# Patient Record
Sex: Male | Born: 1955
Health system: Southern US, Community
[De-identification: ages and names within clinical notes are randomized; demographics above are authoritative.]

## PROBLEM LIST (undated history)

## (undated) DIAGNOSIS — C801 Malignant (primary) neoplasm, unspecified: Secondary | ICD-10-CM

## (undated) DIAGNOSIS — C61 Malignant neoplasm of prostate: Secondary | ICD-10-CM

## (undated) DIAGNOSIS — Z9889 Other specified postprocedural states: Secondary | ICD-10-CM

## (undated) DIAGNOSIS — K219 Gastro-esophageal reflux disease without esophagitis: Secondary | ICD-10-CM

## (undated) DIAGNOSIS — E785 Hyperlipidemia, unspecified: Secondary | ICD-10-CM

## (undated) DIAGNOSIS — R112 Nausea with vomiting, unspecified: Secondary | ICD-10-CM

## (undated) DIAGNOSIS — J302 Other seasonal allergic rhinitis: Secondary | ICD-10-CM

## (undated) DIAGNOSIS — T8859XA Other complications of anesthesia, initial encounter: Secondary | ICD-10-CM

## (undated) DIAGNOSIS — M2550 Pain in unspecified joint: Secondary | ICD-10-CM

## (undated) DIAGNOSIS — IMO0001 Reserved for inherently not codable concepts without codable children: Secondary | ICD-10-CM

## (undated) DIAGNOSIS — D649 Anemia, unspecified: Secondary | ICD-10-CM

## (undated) DIAGNOSIS — M2629 Other anomalies of dental arch relationship: Secondary | ICD-10-CM

## (undated) DIAGNOSIS — K649 Unspecified hemorrhoids: Secondary | ICD-10-CM

## (undated) HISTORY — DX: Reserved for inherently not codable concepts without codable children: IMO0001

## (undated) HISTORY — DX: Pain in unspecified joint: M25.50

## (undated) HISTORY — PX: CARPAL TUNNEL RELEASE: SHX101

## (undated) HISTORY — DX: Gastro-esophageal reflux disease without esophagitis: K21.9

## (undated) HISTORY — DX: Unspecified hemorrhoids: K64.9

## (undated) HISTORY — DX: Hyperlipidemia, unspecified: E78.5

## (undated) HISTORY — DX: Malignant neoplasm of prostate: C61

---

## 2002-01-06 HISTORY — PX: VASECTOMY: SHX75

## 2005-10-12 DIAGNOSIS — E7800 Pure hypercholesterolemia, unspecified: Secondary | ICD-10-CM | POA: Insufficient documentation

## 2005-10-12 DIAGNOSIS — G4700 Insomnia, unspecified: Secondary | ICD-10-CM | POA: Insufficient documentation

## 2005-11-14 ENCOUNTER — Emergency Department (HOSPITAL_COMMUNITY): Admission: EM | Admit: 2005-11-14 | Discharge: 2005-11-14 | Payer: Self-pay | Admitting: Emergency Medicine

## 2006-05-26 DIAGNOSIS — K219 Gastro-esophageal reflux disease without esophagitis: Secondary | ICD-10-CM | POA: Insufficient documentation

## 2006-06-14 ENCOUNTER — Ambulatory Visit: Payer: Self-pay | Admitting: Gastroenterology

## 2007-01-07 ENCOUNTER — Encounter: Admission: RE | Admit: 2007-01-07 | Discharge: 2007-01-07 | Payer: Self-pay | Admitting: Neurology

## 2007-02-01 ENCOUNTER — Ambulatory Visit: Payer: Self-pay | Admitting: Neurology

## 2007-07-20 DIAGNOSIS — Z8249 Family history of ischemic heart disease and other diseases of the circulatory system: Secondary | ICD-10-CM | POA: Insufficient documentation

## 2010-05-22 ENCOUNTER — Ambulatory Visit: Payer: Self-pay | Admitting: Family Medicine

## 2010-10-10 ENCOUNTER — Ambulatory Visit: Payer: Self-pay | Admitting: Family Medicine

## 2011-11-10 LAB — HM COLONOSCOPY

## 2012-03-09 HISTORY — PX: ANAL FISSURE REPAIR: SHX2312

## 2012-06-21 ENCOUNTER — Encounter: Payer: Self-pay | Admitting: General Surgery

## 2012-06-21 ENCOUNTER — Ambulatory Visit (INDEPENDENT_AMBULATORY_CARE_PROVIDER_SITE_OTHER): Payer: BC Managed Care – PPO | Admitting: General Surgery

## 2012-06-21 VITALS — BP 110/62 | HR 80 | Resp 14 | Ht 70.0 in | Wt 165.0 lb

## 2012-06-21 DIAGNOSIS — K6289 Other specified diseases of anus and rectum: Secondary | ICD-10-CM

## 2012-06-21 DIAGNOSIS — B372 Candidiasis of skin and nail: Secondary | ICD-10-CM

## 2012-06-21 DIAGNOSIS — K645 Perianal venous thrombosis: Secondary | ICD-10-CM | POA: Insufficient documentation

## 2012-06-21 MED ORDER — NYSTATIN 100000 UNIT/GM EX POWD: Freq: Two times a day (BID) | CUTANEOUS | Status: DC

## 2012-06-21 NOTE — Patient Instructions (Signed)
Patient instructed to discontinue the use of proctofoam. Patient to return in 1 month. Prescribed rectal powder to use twice daily.

## 2012-06-21 NOTE — Progress Notes (Signed)
Patient ID: Dale Gray, male   DOB: [birth year], 57 y.o.   MRN: [id number]  Chief Complaint  Patient presents with  . Rectal Pain    evaluation for possible anal fissure    HPI Dale Gray is a 57 y.o. male who presents for rectal pain with possible anal fissure. The patient states this has been a problem for approximately 1 year. He states the pain is constantly there. Some days it's worse than others. When he rides equipment that tends to be bothersome. He states that he feels raw all the time. Approximately a year ago he prescribed proctofoam to use which seems to help. He states he is tired of dealing with the pain. He initially was seen by Dale Gray which referred him to Dale Gray. Dale Gray told him that he had an anal fissure and was given more medication at that time.  The patient reports that the sharp razor blade-like pain present on initial assessment with the GI department has resolved. Most of his discomfort at this time is related to burning and itching. HPI  Past Medical History  Diagnosis Date  . Hyperlipidemia   . Reflux   . Hemorrhoid   . Joint pain     History reviewed. No pertinent past surgical history.  History reviewed. No pertinent family history.  Social History History  Substance Use Topics  . Smoking status: Never Smoker   . Smokeless tobacco: Never Used  . Alcohol Use: No    No Known Allergies  Current Outpatient Prescriptions  Medication Sig Dispense Refill  . aspirin 81 MG tablet Take 81 mg by mouth daily.      . fish oil-omega-3 fatty acids 1000 MG capsule Take 2 g by mouth daily.      . hydrocortisone-pramoxine (PROCTOFOAM-HC) rectal foam Place 1 applicator rectally 2 (two) times daily.      Marland Kitchen nystatin (MYCOSTATIN) powder Apply topically 2 (two) times daily. Apply small amount to anal area twice a day.  30 g  1  . omeprazole (PRILOSEC) 40 MG capsule Take 40 mg by mouth daily.      . pregabalin (LYRICA) 100 MG capsule Take 100 mg by  mouth 2 (two) times daily.      . Red Yeast Rice 600 MG CAPS Take 1,200 mg by mouth daily.      Marland Kitchen zolpidem (AMBIEN CR) 12.5 MG CR tablet Take 12.5 mg by mouth at bedtime as needed for sleep.       No current facility-administered medications for this visit.    Review of Systems Review of Systems  Constitutional: Negative.   Respiratory: Negative.   Cardiovascular: Negative.   Gastrointestinal: Positive for rectal pain.    Blood pressure 110/62, pulse 80, resp. rate 14, height 5\' 10"  (1.778 m), weight 165 lb (74.844 kg).  Physical Exam Physical Exam  Constitutional: He appears well-developed and well-nourished.  Neck: Trachea normal. No mass and no thyromegaly present.  Cardiovascular: Normal rate, regular rhythm, normal heart sounds and normal pulses.   No murmur heard. Pulmonary/Chest: Effort normal and breath sounds normal.  Abdominal: Soft. Normal appearance and bowel sounds are normal. There is no tenderness.  Genitourinary: Prostate normal.  External rash. Yeast infection.    Data Reviewed Records from Dale Shark, NP at [facility] GI department were reviewed. At the time of his 11/03/2011 examination he reported a six-month history of anal pain. Diagnosis at that time was anal fissure. He was treated with Analpram-HC cream 2.5%. Subsequently  he underwent a colonoscopy completed by Dale Gray, M.D. This was completed on 11/10/2011. Examination worse notable for an anal fissure but otherwise no abnormality.  Assessment    Yeast overgrowth secondary to chronic steroid use.    Plan    The patient has been placed on nystatin powder to be used twice a day. He is not to make use of any more topical steroids. Will see his response over the next month.       Dale Gray 06/22/2012, 4:12 PM

## 2012-06-22 ENCOUNTER — Encounter: Payer: Self-pay | Admitting: General Surgery

## 2012-06-22 DIAGNOSIS — B372 Candidiasis of skin and nail: Secondary | ICD-10-CM | POA: Insufficient documentation

## 2012-07-27 ENCOUNTER — Encounter: Payer: Self-pay | Admitting: General Surgery

## 2012-07-27 ENCOUNTER — Ambulatory Visit (INDEPENDENT_AMBULATORY_CARE_PROVIDER_SITE_OTHER): Payer: BC Managed Care – PPO | Admitting: General Surgery

## 2012-07-27 VITALS — BP 124/62 | HR 62 | Resp 14 | Ht 70.0 in | Wt 163.0 lb

## 2012-07-27 DIAGNOSIS — K6289 Other specified diseases of anus and rectum: Secondary | ICD-10-CM

## 2012-07-27 DIAGNOSIS — B372 Candidiasis of skin and nail: Secondary | ICD-10-CM

## 2012-07-27 NOTE — Patient Instructions (Signed)
Patient advised to could purchase a gel cushion to sit on while riding his tractor.

## 2012-07-27 NOTE — Progress Notes (Signed)
Patient ID: [name], male   DOB: [birth year], 57 y.o.   MRN: [id number]  Chief Complaint  Patient presents with  . Follow-up    1 month follow up rectal pain    HPI Dale Gray is a 57 y.o. male who presents for a 1 month follow up of rectal pain. The patient states he has good days and bad days. He states he is still using the nystatin powder that was prescribed at last visit. He states it has helped some. 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 He has been making use of an "doughnut". HPI  Past Medical History  Diagnosis Date  . Hyperlipidemia   . Reflux   . Hemorrhoid   . Joint pain     History reviewed. No pertinent past surgical history.  History reviewed. No pertinent family history.  Social History History  Substance Use Topics  . Smoking status: Never Smoker   . Smokeless tobacco: Never Used  . Alcohol Use: No    No Known Allergies  Current Outpatient Prescriptions  Medication Sig Dispense Refill  . aspirin 81 MG tablet Take 81 mg by mouth daily.      . fish oil-omega-3 fatty acids 1000 MG capsule Take 2 g by mouth daily.      Marland Kitchen nystatin (MYCOSTATIN) powder Apply topically 2 (two) times daily. Apply small amount to anal area twice a day.  30 g  1  . omeprazole (PRILOSEC) 40 MG capsule Take 40 mg by mouth daily.      . pregabalin (LYRICA) 100 MG capsule Take 100 mg by mouth 2 (two) times daily.      . Red Yeast Rice 600 MG CAPS Take 1,200 mg by mouth daily.      Marland Kitchen zolpidem (AMBIEN CR) 12.5 MG CR tablet Take 12.5 mg by mouth at bedtime as needed for sleep.       No current facility-administered medications for this visit.    Review of Systems Review of Systems  Constitutional: Negative.   Respiratory: Negative.   Cardiovascular: Negative.   Gastrointestinal: Positive for rectal pain.    Blood pressure 124/62, pulse 62, resp. rate 14, height 5\' 10"  (1.778 m), weight 163 lb (73.936 kg).  Physical  Exam Physical Exam Examination of the external anal area showed a marked improvement in the erythema and thickening of the skin. With figure straining no prolapse is noted. No internal/external hemorrhoids evident. Data Reviewed None  Assessment    Benign perianal exam.    Plan    The patient was encouraged to get a gel pad for his tractor to see if this will improve his comfort. He'll be using the nystatin powder when necessary.       [name] 07/27/2012, 10:35 AM

## 2012-11-29 LAB — HM HIV SCREENING LAB: HM HIV SCREENING: NEGATIVE

## 2012-11-29 LAB — HM HEPATITIS C SCREENING LAB: HM HEPATITIS C SCREENING: NEGATIVE

## 2014-01-13 ENCOUNTER — Emergency Department: Payer: Self-pay | Admitting: Emergency Medicine

## 2014-04-30 HISTORY — PX: TYMPANOPLASTY: SHX33

## 2014-05-02 ENCOUNTER — Ambulatory Visit: Payer: Self-pay | Admitting: Otolaryngology

## 2014-08-27 ENCOUNTER — Encounter: Payer: Self-pay | Admitting: Family Medicine

## 2014-08-27 ENCOUNTER — Ambulatory Visit (INDEPENDENT_AMBULATORY_CARE_PROVIDER_SITE_OTHER): Payer: BLUE CROSS/BLUE SHIELD | Admitting: Family Medicine

## 2014-08-27 VITALS — BP 112/60 | HR 68 | Temp 98.6°F | Resp 16 | Ht 70.0 in | Wt 160.0 lb

## 2014-08-27 DIAGNOSIS — J0110 Acute frontal sinusitis, unspecified: Secondary | ICD-10-CM

## 2014-08-27 MED ORDER — AMOXICILLIN 500 MG PO CAPS: 1000.0000 mg | ORAL_CAPSULE | Freq: Two times a day (BID) | ORAL | Status: AC

## 2014-08-27 MED ORDER — FLUTICASONE PROPIONATE 50 MCG/ACT NA SUSP: 2.0000 | Freq: Every day | NASAL | Status: DC

## 2014-08-27 NOTE — Progress Notes (Signed)
   Subjective:    Patient ID: Dale Gray, male    DOB: 1955-06-07, 59 y.o.   MRN: [id number]  Sinusitis This is a new problem. The current episode started in the past 7 days. The problem is unchanged. There has been no fever (Pt reports he may have had a fever last night, but he did not take his temp.). His pain is at a severity of 8/10. The pain is severe. Associated symptoms include chills, congestion, coughing, diaphoresis, headaches and sinus pressure. Pertinent negatives include no ear pain, shortness of breath, sneezing or sore throat. Past treatments include oral decongestants. The treatment provided mild relief.   No allergies before onset of above symptoms. No nose sprays or decongestants.    Review of Systems  Constitutional: Positive for fever (Possibly last night.), chills, diaphoresis and fatigue. Negative for activity change, appetite change and unexpected weight change.  HENT: Positive for congestion, postnasal drip, rhinorrhea, sinus pressure, tinnitus, trouble swallowing and voice change. Negative for dental problem, drooling, ear discharge, ear pain, facial swelling, hearing loss, mouth sores, nosebleeds, sneezing and sore throat.   Eyes: Negative for photophobia, pain, discharge, redness, itching and visual disturbance.  Respiratory: Positive for cough and wheezing. Negative for apnea, choking, chest tightness, shortness of breath and stridor.   Cardiovascular: Negative for chest pain, palpitations and leg swelling.  Gastrointestinal: Negative for nausea, vomiting, abdominal pain, diarrhea, constipation, blood in stool, abdominal distention, anal bleeding and rectal pain.  Neurological: Positive for headaches. Negative for dizziness, tremors, seizures, syncope, facial asymmetry, speech difficulty, weakness, light-headedness and numbness.       Objective:   Physical Exam   General Appearance:    Alert, cooperative, no distress  Eyes:    PERRL, conjunctiva/corneas  clear, EOM's intact       HEENT:   Moderate nasal congestion with yellow discharge. Tender frontal and maxillary sinuses.   Lungs:     Clear to auscultation bilaterally, respirations unlabored  Heart:    Regular rate and rhythm  Neurologic:   Awake, alert, oriented x 3. No apparent focal neurological           defect.             Assessment & Plan:

## 2014-09-07 ENCOUNTER — Other Ambulatory Visit: Payer: Self-pay | Admitting: Otolaryngology

## 2014-09-07 DIAGNOSIS — IMO0001 Reserved for inherently not codable concepts without codable children: Secondary | ICD-10-CM

## 2014-09-07 DIAGNOSIS — H918X2 Other specified hearing loss, left ear: Secondary | ICD-10-CM

## 2014-09-14 ENCOUNTER — Ambulatory Visit: Payer: Self-pay

## 2014-10-08 HISTORY — PX: TYMPANOPLASTY: SHX33

## 2014-10-23 ENCOUNTER — Other Ambulatory Visit: Payer: Self-pay | Admitting: Family Medicine

## 2014-10-25 ENCOUNTER — Other Ambulatory Visit: Payer: Self-pay | Admitting: Family Medicine

## 2014-11-19 ENCOUNTER — Telehealth: Payer: Self-pay | Admitting: Family Medicine

## 2014-11-19 MED ORDER — ZOLPIDEM TARTRATE ER 12.5 MG PO TBCR: 12.5000 mg | EXTENDED_RELEASE_TABLET | Freq: Every evening | ORAL | Status: DC | PRN

## 2014-11-19 NOTE — Telephone Encounter (Signed)
Rx called in to pharmacy. 

## 2014-11-19 NOTE — Telephone Encounter (Signed)
Patient needs refill on Zolpidem please.  He is totally out.    [facility].

## 2015-01-15 ENCOUNTER — Other Ambulatory Visit: Payer: Self-pay | Admitting: Family Medicine

## 2015-01-15 MED ORDER — ZOLPIDEM TARTRATE ER 12.5 MG PO TBCR: 12.5000 mg | EXTENDED_RELEASE_TABLET | Freq: Every evening | ORAL | Status: DC | PRN

## 2015-01-15 NOTE — Telephone Encounter (Signed)
RX called in at [facility]. Patient is aware.

## 2015-01-15 NOTE — Telephone Encounter (Signed)
Patient is in need of refill of Ambien.   Patient is requesting more refills on this med because he has to call in every month for refills.  [facility] in [place].

## 2015-01-22 DIAGNOSIS — M533 Sacrococcygeal disorders, not elsewhere classified: Secondary | ICD-10-CM | POA: Insufficient documentation

## 2015-01-22 DIAGNOSIS — E785 Hyperlipidemia, unspecified: Secondary | ICD-10-CM | POA: Insufficient documentation

## 2015-01-22 DIAGNOSIS — Z8719 Personal history of other diseases of the digestive system: Secondary | ICD-10-CM | POA: Insufficient documentation

## 2015-01-22 DIAGNOSIS — D509 Iron deficiency anemia, unspecified: Secondary | ICD-10-CM | POA: Insufficient documentation

## 2015-01-22 DIAGNOSIS — K602 Anal fissure, unspecified: Secondary | ICD-10-CM | POA: Insufficient documentation

## 2015-01-24 ENCOUNTER — Other Ambulatory Visit: Payer: Self-pay

## 2015-01-24 ENCOUNTER — Encounter: Payer: Self-pay | Admitting: Family Medicine

## 2015-01-24 ENCOUNTER — Ambulatory Visit (INDEPENDENT_AMBULATORY_CARE_PROVIDER_SITE_OTHER): Payer: BLUE CROSS/BLUE SHIELD | Admitting: Family Medicine

## 2015-01-24 VITALS — BP 138/82 | HR 53 | Temp 97.7°F | Resp 16 | Ht 70.75 in | Wt 174.2 lb

## 2015-01-24 DIAGNOSIS — G629 Polyneuropathy, unspecified: Secondary | ICD-10-CM | POA: Diagnosis not present

## 2015-01-24 DIAGNOSIS — E7800 Pure hypercholesterolemia, unspecified: Secondary | ICD-10-CM

## 2015-01-24 DIAGNOSIS — G4700 Insomnia, unspecified: Secondary | ICD-10-CM | POA: Diagnosis not present

## 2015-01-24 DIAGNOSIS — D509 Iron deficiency anemia, unspecified: Secondary | ICD-10-CM

## 2015-01-24 DIAGNOSIS — Z0000 Encounter for general adult medical examination without abnormal findings: Secondary | ICD-10-CM | POA: Diagnosis not present

## 2015-01-24 LAB — POCT URINALYSIS DIPSTICK
Bilirubin, UA: NEGATIVE
Blood, UA: NEGATIVE
GLUCOSE UA: NEGATIVE
Ketones, UA: NEGATIVE
Leukocytes, UA: NEGATIVE
Nitrite, UA: NEGATIVE
Protein, UA: NEGATIVE
SPEC GRAV UA: 1.02
UROBILINOGEN UA: 0.2
pH, UA: 7

## 2015-01-24 NOTE — Progress Notes (Signed)
Patient ID: Dale Gray, male   DOB: 1955-08-15, 59 y.o.   MRN: [id number]       Patient: Dale Gray, Male    DOB: Jun 07, 1955, 59 y.o.   MRN: [id number] Visit Date: 01/24/2015  Today's Provider: Vernie Murders, PA   Chief Complaint  Patient presents with  . Annual Exam   Subjective:    Annual physical exam Dale Gray is a 59 y.o. male who presents today for health maintenance and complete physical. He feels well. He reports exercising daily stretching. He reports he is sleeping fairly well (5 hours a night)  -----------------------------------------------------------------  Review of Systems  Constitutional: Negative.   HENT: Positive for hearing loss.   Eyes: Negative.   Respiratory: Negative.   Cardiovascular: Negative.   Gastrointestinal: Negative.   Endocrine: Negative.   Genitourinary:       Occasional decrease in stream but no nocturia.  Musculoskeletal: Positive for back pain.       Ache infrequent as long as he does his stretching exercises.  Skin: Negative.   Allergic/Immunologic: Negative.   Neurological: Negative.   Hematological: Negative.   Psychiatric/Behavioral: Negative.    Social History He  reports that he has never smoked. He has never used smokeless tobacco. He reports that he does not drink alcohol or use illicit drugs. Social History   Social History  . Marital Status: Married    Spouse Name: N/A  . Number of Children: N/A  . Years of Education: N/A   Social History Main Topics  . Smoking status: Never Smoker   . Smokeless tobacco: Never Used  . Alcohol Use: No  . Drug Use: No  . Sexual Activity: Monogamous   Other Topics Concern  . None   Social History Narrative   Patient Active Problem List   Diagnosis Date Noted  . Anal fissure 01/22/2015  . Disorder of coccyx 01/22/2015  . History of digestive disease 01/22/2015  . HLD (hyperlipidemia) 01/22/2015  . Anemia, iron deficiency 01/22/2015  . Yeast dermatitis  06/22/2012  . Hemorrhoid   . Fam hx-ischem heart disease 07/20/2007  . Acid reflux 05/26/2006  . Cannot sleep 10/12/2005  . Hypercholesterolemia without hypertriglyceridemia 10/12/2005   Past Surgical History  Procedure Laterality Date  . Anal fissure repair    . Tympanoplasty Left 04/30/2014  . Vasectomy  01/06/2002   Family History  Family Status  Relation Status Death Age  . Mother Alive   . Father Alive   . Sister Alive   . Brother Alive   . Daughter Alive   . Daughter Alive    His family history includes Asthma in his mother; CAD in his father; Diabetes in his maternal aunt and maternal uncle; Healthy in his brother, daughter, daughter, and sister; Heart disease in his father and mother.    Allergies  Allergen Reactions  . Lovastatin     Sleep disturbance and extremity pains   Previous Medications   ASPIRIN 81 MG TABLET    Take 81 mg by mouth daily.   FERROUS SULFATE 325 (65 FE) MG TABLET    Take by mouth.   FISH OIL-OMEGA-3 FATTY ACIDS 1000 MG CAPSULE    Take 2 g by mouth daily.   OMEPRAZOLE (PRILOSEC) 40 MG CAPSULE    TAKE (1) CAPSULE BY MOUTH ONCE DAILY.   PREGABALIN (LYRICA) 100 MG CAPSULE    Take 100 mg by mouth 2 (two) times daily.   THIAMINE 100 MG TABLET    Take by  mouth.   ZOLPIDEM (AMBIEN CR) 12.5 MG CR TABLET    Take 1 tablet (12.5 mg total) by mouth at bedtime as needed for sleep.   Patient Care Team: Margo Common, PA as PCP - General (Family Medicine) Robert Bellow, MD as Consulting Physician (General Surgery)     Objective:   Vitals: BP 138/82 mmHg  Pulse 53  Temp(Src) 97.7 F (36.5 C) (Oral)  Resp 16  Ht 5' 10.75" (1.797 m)  Wt 174 lb 3.2 oz (79.017 kg)  BMI 24.47 kg/m2  SpO2 100%   Physical Exam  Constitutional: He is oriented to person, place, and time. He appears well-developed and well-nourished.  HENT:  Head: Normocephalic and atraumatic.  Right Ear: External ear normal.  Left Ear: External ear normal.  Nose: Nose normal.   Mouth/Throat: Oropharynx is clear and moist.  Eyes: Conjunctivae and EOM are normal. Pupils are equal, round, and reactive to light. Right eye exhibits no discharge.  Left TM perforation persists. Followed by Dr. Pryor Ochoa.  Neck: Normal range of motion. Neck supple. No tracheal deviation present. No thyromegaly present.  Cardiovascular: Normal rate, regular rhythm, normal heart sounds and intact distal pulses.   No murmur heard. Pulmonary/Chest: Effort normal and breath sounds normal. No respiratory distress. He has no wheezes. He has no rales. He exhibits no tenderness.  Abdominal: Soft. He exhibits no distension and no mass. There is no tenderness. There is no rebound and no guarding.  Genitourinary: Rectum normal and penis normal. Guaiac negative stool.  Slight enlargement of prostate without nodules.  Musculoskeletal: Normal range of motion. He exhibits no edema or tenderness.  Lymphadenopathy:    He has no cervical adenopathy.  Neurological: He is alert and oriented to person, place, and time. He has normal reflexes. No cranial nerve deficit. He exhibits normal muscle tone. Coordination normal.  Skin: Skin is warm and dry. No rash noted. No erythema.  Psychiatric: He has a normal mood and affect. His behavior is normal. Judgment and thought content normal.     Depression Screen Good spirits. No suicidal ideation.    Assessment & Plan:     Routine Health Maintenance and Physical Exam  Exercise Activities and Dietary recommendations Goals    None      Immunization History  Administered Date(s) Administered  . Tdap 05/26/2006    Health Maintenance  Topic Date Due  . HIV Screening  11/23/1972  . INFLUENZA VACCINE  10/08/2015  . TETANUS/TDAP  05/25/2016  . COLONOSCOPY  11/12/2021  . Hepatitis C Screening  Completed      Discussed health benefits of physical activity, and encouraged him to engage in regular exercise appropriate for his age and condition.      -------------------------------------------------------------------- 1. Annual physical exam General heath good. Immunizations up to date. Will get HIV screening test. Dr. Pryor Ochoa has been treating left ear pain with TM perforation and hearing loss since the Summer of 2016. May need a tympanoplasty in the near future.Last Tdap was 05-26-06 and has had colonoscopy in 2008 & 2013 with history of anal fissure but no malignancies. - POCT urinalysis dipstick - HIV antibody (with reflex)  2. Anemia, iron deficiency History of anemia. Good energy level and no melena or hematochezia recently. History of anal fissure. Will recheck labs. - CBC with Differential/Platelet - COMPLETE METABOLIC PANEL WITH GFR - Ferritin  3. Cannot sleep Chronic insomnia for years. Ambien has helped him initiate sleep and sleep 5 hours a night. No morning drowsiness and  good energy level. No daytime napping or sleepiness. Continue Ambien once a night.  4. Peripheral polyneuropathy (HCC) Tolerating Lyrica with good control of symptoms. Describes this as a burning, pins and needles sensation in his feet and lower legs which is worse before falling asleep at night or at rest. Will continue dose of 100 mg BID.  5. Hypercholesterolemia without hypertriglyceridemia Past trial of Lovastatin caused increase in polyneuropathy pains and worsened sleep disturbance. Trying to follow low fat diet. Work in [employer] and is very physically active. Recheck labs and follow up pending reports. - COMPLETE METABOLIC PANEL WITH GFR - Lipid panel - TSH

## 2015-01-26 LAB — COMPREHENSIVE METABOLIC PANEL
ALK PHOS: 59 IU/L (ref 39–117)
ALT: 11 IU/L (ref 0–44)
AST: 16 IU/L (ref 0–40)
Albumin/Globulin Ratio: 1.9 (ref 1.1–2.5)
Albumin: 4.4 g/dL (ref 3.5–5.5)
BILIRUBIN TOTAL: 0.7 mg/dL (ref 0.0–1.2)
BUN/Creatinine Ratio: 16 (ref 9–20)
BUN: 17 mg/dL (ref 6–24)
CHLORIDE: 103 mmol/L (ref 97–106)
CO2: 24 mmol/L (ref 18–29)
Calcium: 9.3 mg/dL (ref 8.7–10.2)
Creatinine, Ser: 1.09 mg/dL (ref 0.76–1.27)
GFR calc non Af Amer: 75 mL/min/{1.73_m2} (ref >59)
GFR, EST AFRICAN AMERICAN: 87 mL/min/{1.73_m2} (ref >59)
GLUCOSE: 86 mg/dL (ref 65–99)
Globulin, Total: 2.3 g/dL (ref 1.5–4.5)
Potassium: 4.3 mmol/L (ref 3.5–5.2)
Sodium: 141 mmol/L (ref 136–144)
TOTAL PROTEIN: 6.7 g/dL (ref 6.0–8.5)

## 2015-01-26 LAB — TSH: TSH: 1.63 u[IU]/mL (ref 0.450–4.500)

## 2015-01-26 LAB — CBC WITH DIFFERENTIAL/PLATELET
BASOS ABS: 0 10*3/uL (ref 0.0–0.2)
Basos: 0 %
EOS (ABSOLUTE): 0.2 10*3/uL (ref 0.0–0.4)
Eos: 3 %
HEMATOCRIT: 41.2 % (ref 37.5–51.0)
Hemoglobin: 14.2 g/dL (ref 12.6–17.7)
Immature Grans (Abs): 0 10*3/uL (ref 0.0–0.1)
Immature Granulocytes: 0 %
LYMPHS ABS: 1.5 10*3/uL (ref 0.7–3.1)
Lymphs: 24 %
MCH: 31 pg (ref 26.6–33.0)
MCHC: 34.5 g/dL (ref 31.5–35.7)
MCV: 90 fL (ref 79–97)
MONOS ABS: 0.6 10*3/uL (ref 0.1–0.9)
Monocytes: 10 %
NEUTROS PCT: 63 %
Neutrophils Absolute: 3.8 10*3/uL (ref 1.4–7.0)
PLATELETS: 272 10*3/uL (ref 150–379)
RBC: 4.58 x10E6/uL (ref 4.14–5.80)
RDW: 13.4 % (ref 12.3–15.4)
WBC: 6 10*3/uL (ref 3.4–10.8)

## 2015-01-26 LAB — LIPID PANEL
CHOLESTEROL TOTAL: 215 mg/dL — AB (ref 100–199)
Chol/HDL Ratio: 4.7 ratio units (ref 0.0–5.0)
HDL: 46 mg/dL (ref >39)
LDL CALC: 157 mg/dL — AB (ref 0–99)
Triglycerides: 62 mg/dL (ref 0–149)
VLDL Cholesterol Cal: 12 mg/dL (ref 5–40)

## 2015-01-26 LAB — HIV ANTIBODY (ROUTINE TESTING W REFLEX): HIV SCREEN 4TH GENERATION: NONREACTIVE

## 2015-01-26 LAB — FERRITIN: Ferritin: 83 ng/mL (ref 30–400)

## 2015-03-06 ENCOUNTER — Other Ambulatory Visit: Payer: Self-pay | Admitting: Family Medicine

## 2015-03-06 NOTE — Telephone Encounter (Signed)
Rx called in to pharmacy. 

## 2015-03-06 NOTE — Telephone Encounter (Signed)
Please call in Lyrica. Thanks.

## 2015-03-19 ENCOUNTER — Other Ambulatory Visit: Payer: Self-pay | Admitting: Family Medicine

## 2015-03-19 ENCOUNTER — Telehealth: Payer: Self-pay | Admitting: Family Medicine

## 2015-03-19 MED ORDER — ZOLPIDEM TARTRATE ER 12.5 MG PO TBCR: 12.5000 mg | EXTENDED_RELEASE_TABLET | Freq: Every evening | ORAL | Status: DC | PRN

## 2015-03-19 NOTE — Telephone Encounter (Signed)
Patient needs refills on Zolpidem for sleep.  He is completely out!

## 2015-03-19 NOTE — Telephone Encounter (Signed)
RX called in at pharmacy. Patient is aware.  

## 2015-03-19 NOTE — Telephone Encounter (Signed)
Patient needs refill on Zolpidem please.  He is out completely and needs this please.   [facility]

## 2015-03-26 ENCOUNTER — Other Ambulatory Visit: Payer: Self-pay

## 2015-03-26 NOTE — Telephone Encounter (Signed)
Error-aa 

## 2015-04-18 ENCOUNTER — Telehealth: Payer: Self-pay | Admitting: Family Medicine

## 2015-04-18 MED ORDER — OMEPRAZOLE 40 MG PO CPDR: DELAYED_RELEASE_CAPSULE | ORAL | Status: DC

## 2015-04-18 NOTE — Telephone Encounter (Signed)
Refilled Omeprazole for acid reflux disease. Recheck prn.

## 2015-04-18 NOTE — Telephone Encounter (Signed)
Patient needs refill on Omeprazole 40 mg.   Call to [facility], [place]

## 2015-04-19 ENCOUNTER — Other Ambulatory Visit: Payer: Self-pay

## 2015-04-19 MED ORDER — OMEPRAZOLE 40 MG PO CPDR: DELAYED_RELEASE_CAPSULE | ORAL | Status: DC

## 2015-04-19 NOTE — Telephone Encounter (Signed)
Refill request received from [facility] requesting Omeprazole DR 40 mg.

## 2015-05-17 ENCOUNTER — Telehealth: Payer: Self-pay | Admitting: Family Medicine

## 2015-05-17 NOTE — Telephone Encounter (Signed)
Patient needs refill on Ambien please.  He will be out tomorrow night.

## 2015-07-15 ENCOUNTER — Other Ambulatory Visit: Payer: Self-pay

## 2015-07-15 MED ORDER — ZOLPIDEM TARTRATE ER 12.5 MG PO TBCR: 12.5000 mg | EXTENDED_RELEASE_TABLET | Freq: Every evening | ORAL | Status: DC | PRN

## 2015-07-15 NOTE — Telephone Encounter (Signed)
Phone in refill order for Zolpidem 12.5 mg hs #30 and 1 refill to the [facility].

## 2015-07-15 NOTE — Telephone Encounter (Signed)
Refill request received from [facility] requesting Zolpidem 12.5 mg.

## 2015-07-16 NOTE — Telephone Encounter (Signed)
Refill called in at [facility].

## 2015-08-13 ENCOUNTER — Ambulatory Visit (INDEPENDENT_AMBULATORY_CARE_PROVIDER_SITE_OTHER): Payer: BLUE CROSS/BLUE SHIELD | Admitting: Family Medicine

## 2015-08-13 ENCOUNTER — Other Ambulatory Visit: Payer: Self-pay

## 2015-08-13 ENCOUNTER — Encounter: Payer: Self-pay | Admitting: Family Medicine

## 2015-08-13 VITALS — BP 122/70 | HR 54 | Temp 98.5°F | Resp 16 | Wt 173.0 lb

## 2015-08-13 DIAGNOSIS — D509 Iron deficiency anemia, unspecified: Secondary | ICD-10-CM

## 2015-08-13 DIAGNOSIS — R0600 Dyspnea, unspecified: Secondary | ICD-10-CM

## 2015-08-13 MED ORDER — ZOLPIDEM TARTRATE ER 12.5 MG PO TBCR: 12.5000 mg | EXTENDED_RELEASE_TABLET | Freq: Every evening | ORAL | Status: DC | PRN

## 2015-08-13 NOTE — Telephone Encounter (Signed)
Pharmacy requesting refill.

## 2015-08-13 NOTE — Progress Notes (Signed)
Patient ID: Dale Gray, male   DOB: 1956-02-24, 60 y.o.   MRN: [id number]       Patient: Dale Gray Male    DOB: February 09, 1956   60 y.o.   MRN: [id number] Visit Date: 08/13/2015  Today's Provider: Vernie Murders, PA   Chief Complaint  Patient presents with  . Shortness of Breath    X 6 months.    Subjective:    Shortness of Breath This is a new problem. The current episode started more than 1 month ago. The problem occurs intermittently. The problem has been gradually worsening. Pertinent negatives include no wheezing.   Patient reports that he has been going on for 6 months. He reports that even going up the stairs he becomes short of breath.    Past Medical History  Diagnosis Date  . Hyperlipidemia   . Reflux   . Hemorrhoid   . Joint pain    Patient Active Problem List   Diagnosis Date Noted  . Peripheral neuropathy ([place]) 01/24/2015  . Anal fissure 01/22/2015  . Disorder of coccyx 01/22/2015  . History of digestive disease 01/22/2015  . HLD (hyperlipidemia) 01/22/2015  . Anemia, iron deficiency 01/22/2015  . Yeast dermatitis 06/22/2012  . Hemorrhoid   . Fam hx-ischem heart disease 07/20/2007  . Acid reflux 05/26/2006  . Cannot sleep 10/12/2005  . Hypercholesterolemia without hypertriglyceridemia 10/12/2005   Past Surgical History  Procedure Laterality Date  . Anal fissure repair    . Tympanoplasty Left 04/30/2014  . Vasectomy  01/06/2002   Family History  Problem Relation Age of Onset  . Heart disease Mother   . Asthma Mother   . Heart disease Father   . CAD Father   . Healthy Sister   . Healthy Brother   . Healthy Daughter   . Diabetes Maternal Aunt   . Diabetes Maternal Uncle   . Healthy Daughter    Allergies  Allergen Reactions  . Lovastatin     Sleep disturbance and extremity pains   Previous Medications   ASPIRIN 81 MG TABLET    Take 81 mg by mouth daily.   FERROUS SULFATE 325 (65 FE) MG TABLET    Take by mouth.   FISH OIL-OMEGA-3  FATTY ACIDS 1000 MG CAPSULE    Take 2 g by mouth daily.   LYRICA 100 MG CAPSULE    TAKE (1) CAPSULE BY MOUTH THREE TIMES DAILY   OMEPRAZOLE (PRILOSEC) 40 MG CAPSULE    TAKE (1) CAPSULE BY MOUTH ONCE DAILY.   THIAMINE 100 MG TABLET    Take by mouth.   ZOLPIDEM (AMBIEN CR) 12.5 MG CR TABLET    Take 1 tablet (12.5 mg total) by mouth at bedtime as needed for sleep.    Review of Systems  Constitutional: Negative.   Respiratory: Positive for shortness of breath. Negative for apnea, cough, choking, chest tightness, wheezing and stridor.     Social History  Substance Use Topics  . Smoking status: Never Smoker   . Smokeless tobacco: Never Used  . Alcohol Use: No   Objective:   BP 122/70 mmHg  Pulse 54  Temp(Src) 98.5 F (36.9 C)  Resp 16  Wt 173 lb (78.472 kg)  SpO2 97%  Physical Exam  Constitutional: He is oriented to person, place, and time. He appears well-developed and well-nourished. No distress.  HENT:  Head: Normocephalic and atraumatic.  Right Ear: Hearing normal.  Left Ear: Hearing normal.  Nose: Nose normal.  Eyes: Conjunctivae and lids  are normal. Right eye exhibits no discharge. Left eye exhibits no discharge. No scleral icterus.  Neck: Neck supple.  Cardiovascular: Normal rate, regular rhythm and intact distal pulses.   Pulmonary/Chest: Effort normal and breath sounds normal. No respiratory distress.  Abdominal: Soft. Bowel sounds are normal.  Musculoskeletal: Normal range of motion.  Neurological: He is alert and oriented to person, place, and time.  Skin: Skin is intact. No lesion and no rash noted.  Psychiatric: He has a normal mood and affect. His speech is normal and behavior is normal. Thought content normal.      Assessment & Plan:     1. Dyspnea Intermittent short term episodes over the past month. No congestion, chest pains, edema, reflux or wheezing. Does not happen every time with the same activity. EKG shows bradycardia but no acute ischemic changes.  Normal pulmonary function on spirometry. Pulse oximetry 97%. Will check for changes in anemia. If normal results, may need to consider stress test with cardiologist if persistent or worsening. - EKG 12-Lead - Spirometry with graph  2. Anemia, iron deficiency Still taking iron supplement. Hemoglobin was 14.2 on 01-25-15. Will recheck CBC and iron levels.  - Iron - Iron and TIBC - CBC with Differential/Platelet       Vernie Murders, PA  [facility]

## 2015-08-13 NOTE — Telephone Encounter (Signed)
Phone in refill of Zolpidem 12.5 mg CR 1 HS prn #30 & 1 RF to the [facility] in [place].

## 2015-08-14 LAB — CBC WITH DIFFERENTIAL/PLATELET
BASOS ABS: 0 10*3/uL (ref 0.0–0.2)
Basos: 0 %
EOS (ABSOLUTE): 0.3 10*3/uL (ref 0.0–0.4)
EOS: 3 %
HEMATOCRIT: 41.1 % (ref 37.5–51.0)
Hemoglobin: 13.8 g/dL (ref 12.6–17.7)
IMMATURE GRANULOCYTES: 0 %
Immature Grans (Abs): 0 10*3/uL (ref 0.0–0.1)
LYMPHS ABS: 1.9 10*3/uL (ref 0.7–3.1)
Lymphs: 25 %
MCH: 30.7 pg (ref 26.6–33.0)
MCHC: 33.6 g/dL (ref 31.5–35.7)
MCV: 91 fL (ref 79–97)
MONOCYTES: 9 %
MONOS ABS: 0.7 10*3/uL (ref 0.1–0.9)
NEUTROS PCT: 63 %
Neutrophils Absolute: 4.9 10*3/uL (ref 1.4–7.0)
Platelets: 295 10*3/uL (ref 150–379)
RBC: 4.5 x10E6/uL (ref 4.14–5.80)
RDW: 13 % (ref 12.3–15.4)
WBC: 7.8 10*3/uL (ref 3.4–10.8)

## 2015-08-14 LAB — IRON AND TIBC
IRON SATURATION: 29 % (ref 15–55)
Iron: 86 ug/dL (ref 38–169)
TIBC: 294 ug/dL (ref 250–450)
UIBC: 208 ug/dL (ref 111–343)

## 2015-08-16 ENCOUNTER — Telehealth: Payer: Self-pay

## 2015-08-16 NOTE — Telephone Encounter (Signed)
Left message to call back  

## 2015-08-16 NOTE — Telephone Encounter (Signed)
-----   Message from Margo Common, [place] sent at 08/15/2015  9:10 AM EDT ----- Very good iron level and blood tests. No sign of anemia or metabolic disorder. If dyspnea persists or worsens, should consider evaluation by cardiologist for possible stress test or evaluation by pulmonologist.

## 2015-08-16 NOTE — Telephone Encounter (Signed)
Advised patient as below.  

## 2015-08-16 NOTE — Telephone Encounter (Signed)
Called in Rx into pharmacy 

## 2015-08-30 ENCOUNTER — Other Ambulatory Visit: Payer: Self-pay | Admitting: *Deleted

## 2015-08-30 MED ORDER — PREGABALIN 100 MG PO CAPS: ORAL_CAPSULE | ORAL | Status: DC

## 2015-08-30 NOTE — Telephone Encounter (Signed)
Please call in Lyrica

## 2015-08-30 NOTE — Telephone Encounter (Signed)
Rx called in to pharmacy. 

## 2015-08-30 NOTE — Addendum Note (Signed)
Addended by: Vernie Murders E on: 08/30/2015 05:11 PM   Modules accepted: Orders

## 2015-08-30 NOTE — Telephone Encounter (Signed)
Phone in refill of Lyrica per authorized prescription in chart to [facility] in [place].

## 2015-09-05 NOTE — Telephone Encounter (Signed)
Called in medication into pharmacy.

## 2015-09-12 ENCOUNTER — Telehealth: Payer: Self-pay | Admitting: Family Medicine

## 2015-09-12 NOTE — Telephone Encounter (Signed)
Patient needs refill on zolpidem please.

## 2015-09-12 NOTE — Telephone Encounter (Signed)
Called pharmacy to confirm. Pharmacy reports that patient has an additional refill on Ambien. It should be ready at the pharmacy. Patient was advised.

## 2015-09-12 NOTE — Telephone Encounter (Signed)
Be sure pharmacy still has a refill available for him. Refilled with one additional refill on 08-13-15.

## 2015-11-12 ENCOUNTER — Other Ambulatory Visit: Payer: Self-pay | Admitting: Family Medicine

## 2015-12-11 ENCOUNTER — Other Ambulatory Visit: Payer: Self-pay | Admitting: Family Medicine

## 2015-12-13 ENCOUNTER — Ambulatory Visit (INDEPENDENT_AMBULATORY_CARE_PROVIDER_SITE_OTHER): Payer: BLUE CROSS/BLUE SHIELD

## 2015-12-13 ENCOUNTER — Other Ambulatory Visit: Payer: Self-pay | Admitting: Family Medicine

## 2015-12-13 DIAGNOSIS — Z23 Encounter for immunization: Secondary | ICD-10-CM | POA: Diagnosis not present

## 2015-12-13 NOTE — Telephone Encounter (Signed)
RX called in at [facility]  

## 2015-12-13 NOTE — Telephone Encounter (Signed)
Please phone in refill of Ambien to the [facility], please.

## 2015-12-16 ENCOUNTER — Other Ambulatory Visit: Payer: Self-pay | Admitting: Family Medicine

## 2015-12-16 MED ORDER — ZOLPIDEM TARTRATE ER 12.5 MG PO TBCR: 12.5000 mg | EXTENDED_RELEASE_TABLET | Freq: Every evening | ORAL | 0 refills | Status: DC | PRN

## 2015-12-16 NOTE — Telephone Encounter (Signed)
Done on 12-13-15.

## 2016-01-13 ENCOUNTER — Other Ambulatory Visit: Payer: Self-pay | Admitting: Family Medicine

## 2016-01-13 ENCOUNTER — Other Ambulatory Visit: Payer: Self-pay

## 2016-01-13 DIAGNOSIS — G629 Polyneuropathy, unspecified: Secondary | ICD-10-CM

## 2016-01-13 MED ORDER — PREGABALIN 100 MG PO CAPS: ORAL_CAPSULE | ORAL | 1 refills | Status: DC

## 2016-01-13 MED ORDER — ZOLPIDEM TARTRATE ER 12.5 MG PO TBCR: 12.5000 mg | EXTENDED_RELEASE_TABLET | Freq: Every evening | ORAL | 3 refills | Status: DC | PRN

## 2016-01-13 NOTE — Telephone Encounter (Signed)
Patient needs refills on Lyrica please

## 2016-01-13 NOTE — Telephone Encounter (Signed)
Refill request received from [facility] requesting Zolpidem 12.5 mg.

## 2016-01-13 NOTE — Telephone Encounter (Signed)
RX called in at [facility] 

## 2016-01-13 NOTE — Telephone Encounter (Signed)
Left order in chart for refill of Zolpidem to be phoned in to the [facility] in [place].

## 2016-01-13 NOTE — Telephone Encounter (Signed)
Advise patient the Lyrica refill will be phoned into the [facility] in [place]. (Order left in chart - please phone in).

## 2016-01-13 NOTE — Telephone Encounter (Signed)
Patient needs refill on Ambien ASAP!

## 2016-01-13 NOTE — Telephone Encounter (Signed)
Prescription has been called in and patient has been notified. KW

## 2016-01-28 DIAGNOSIS — H5203 Hypermetropia, bilateral: Secondary | ICD-10-CM | POA: Diagnosis not present

## 2016-02-14 ENCOUNTER — Ambulatory Visit (INDEPENDENT_AMBULATORY_CARE_PROVIDER_SITE_OTHER): Payer: BLUE CROSS/BLUE SHIELD | Admitting: Family Medicine

## 2016-02-14 ENCOUNTER — Encounter: Payer: Self-pay | Admitting: Family Medicine

## 2016-02-14 VITALS — BP 130/84 | HR 76 | Temp 97.8°F | Resp 14 | Ht 71.0 in | Wt 176.6 lb

## 2016-02-14 DIAGNOSIS — D509 Iron deficiency anemia, unspecified: Secondary | ICD-10-CM | POA: Diagnosis not present

## 2016-02-14 DIAGNOSIS — Z125 Encounter for screening for malignant neoplasm of prostate: Secondary | ICD-10-CM

## 2016-02-14 DIAGNOSIS — G629 Polyneuropathy, unspecified: Secondary | ICD-10-CM

## 2016-02-14 DIAGNOSIS — E7800 Pure hypercholesterolemia, unspecified: Secondary | ICD-10-CM

## 2016-02-14 DIAGNOSIS — Z1211 Encounter for screening for malignant neoplasm of colon: Secondary | ICD-10-CM | POA: Diagnosis not present

## 2016-02-14 DIAGNOSIS — K219 Gastro-esophageal reflux disease without esophagitis: Secondary | ICD-10-CM | POA: Diagnosis not present

## 2016-02-14 DIAGNOSIS — Z0000 Encounter for general adult medical examination without abnormal findings: Secondary | ICD-10-CM

## 2016-02-14 LAB — IFOBT (OCCULT BLOOD): IMMUNOLOGICAL FECAL OCCULT BLOOD TEST: NEGATIVE

## 2016-02-14 NOTE — Progress Notes (Addendum)
Patient: Dale Gray, Male    DOB: 19-Nov-1955, 60 y.o.   MRN: [id number] Visit Date: 02/14/2016  Today's Provider: Vernie Murders, PA   Chief Complaint  Patient presents with  . Annual Exam   Subjective:    Annual physical exam Dale Gray is a 60 y.o. male who presents today for health maintenance and complete physical. He feels fairly well. He reports exercising at work. He reports he is sleeping well with Ambien.  -----------------------------------------------------------------   Review of Systems  Constitutional: Negative.   HENT: Positive for congestion and rhinorrhea.   Eyes: Negative.   Respiratory: Positive for cough.   Cardiovascular: Negative.   Gastrointestinal: Negative.   Endocrine: Negative.   Genitourinary: Negative.   Musculoskeletal: Negative.   Skin: Negative.   Allergic/Immunologic: Negative.   Neurological: Negative.   Hematological: Negative.   Psychiatric/Behavioral: Negative.     Social History      He  reports that he has never smoked. He has never used smokeless tobacco. He reports that he does not drink alcohol or use drugs.       Social History   Social History  . Marital status: Married    Spouse name: N/A  . Number of children: N/A  . Years of education: N/A   Social History Main Topics  . Smoking status: Never Smoker  . Smokeless tobacco: Never Used  . Alcohol use No  . Drug use: No  . Sexual activity: Not Asked   Other Topics Concern  . None   Social History Narrative  . None    Past Medical History:  Diagnosis Date  . Hemorrhoid   . Hyperlipidemia   . Joint pain   . Reflux      Patient Active Problem List   Diagnosis Date Noted  . Peripheral neuropathy ([place]) 01/24/2015  . Anal fissure 01/22/2015  . Disorder of coccyx 01/22/2015  . History of digestive disease 01/22/2015  . HLD (hyperlipidemia) 01/22/2015  . Anemia, iron deficiency 01/22/2015  . Yeast dermatitis 06/22/2012  . Hemorrhoid    . Fam hx-ischem heart disease 07/20/2007  . Acid reflux 05/26/2006  . Cannot sleep 10/12/2005  . Hypercholesterolemia without hypertriglyceridemia 10/12/2005    Past Surgical History:  Procedure Laterality Date  . ANAL FISSURE REPAIR    . TYMPANOPLASTY Left 04/30/2014  . VASECTOMY  01/06/2002    Family History        Family Status  Relation Status  . Mother Alive  . Father Alive  . Sister Alive  . Brother Alive  . Daughter Alive  . Daughter Alive        His family history includes Asthma in his mother; CAD in his father; Diabetes in his maternal aunt and maternal uncle; Healthy in his brother, daughter, daughter, and sister; Heart disease in his father and mother.     Allergies  Allergen Reactions  . Lovastatin     Sleep disturbance and extremity pains    Current Outpatient Prescriptions:  .  aspirin 81 MG tablet, Take 81 mg by mouth daily., Disp: , Rfl:  .  ferrous sulfate 325 (65 FE) MG tablet, Take by mouth., Disp: , Rfl:  .  fish oil-omega-3 fatty acids 1000 MG capsule, Take 2 g by mouth daily., Disp: , Rfl:  .  omeprazole (PRILOSEC) 40 MG capsule, TAKE (1) CAPSULE BY MOUTH ONCE DAILY., Disp: 90 capsule, Rfl: 3 .  pregabalin (LYRICA) 100 MG capsule, TAKE (1) CAPSULE BY MOUTH  THREE TIMES DAILY, Disp: 90 capsule, Rfl: 1 .  thiamine 100 MG tablet, Take by mouth., Disp: , Rfl:  .  zolpidem (AMBIEN CR) 12.5 MG CR tablet, Take 1 tablet (12.5 mg total) by mouth at bedtime as needed., Disp: 30 tablet, Rfl: 3   Patient Care Team: Margo Common, PA as PCP - General (Family Medicine) Robert Bellow, MD as Consulting Physician (General Surgery)      Objective:   Vitals: BP 130/84 (BP Location: Right Arm, Patient Position: Sitting, Cuff Size: Normal)   Pulse 76   Temp 97.8 F (36.6 C) (Oral)   Resp 14   Ht 5\' 11"  (1.803 m)   Wt 176 lb 9.6 oz (80.1 kg)   BMI 24.63 kg/m   Wt Readings from Last 3 Encounters:  02/14/16 176 lb 9.6 oz (80.1 kg)  08/13/15 173 lb  (78.5 kg)  01/24/15 174 lb 3.2 oz (79 kg)    Physical Exam  Constitutional: He is oriented to person, place, and time. He appears well-developed and well-nourished.  HENT:  Head: Normocephalic and atraumatic.  Right Ear: External ear normal.  Left Ear: External ear normal.  Nose: Nose normal.  Mouth/Throat: Oropharynx is clear and moist.  Eyes: Conjunctivae and EOM are normal. Pupils are equal, round, and reactive to light. Right eye exhibits no discharge.  Neck: Normal range of motion. Neck supple. No tracheal deviation present. No thyromegaly present.  Cardiovascular: Normal rate, regular rhythm, normal heart sounds and intact distal pulses.   No murmur heard. Pulmonary/Chest: Effort normal and breath sounds normal. No respiratory distress. He has no wheezes. He has no rales. He exhibits no tenderness.  Abdominal: Soft. He exhibits no distension and no mass. There is no tenderness. There is no rebound and no guarding.  Genitourinary: Rectum normal, prostate normal and penis normal.  Musculoskeletal: Normal range of motion. He exhibits no edema or tenderness.  Lymphadenopathy:    He has no cervical adenopathy.  Neurological: He is alert and oriented to person, place, and time. He has normal reflexes. No cranial nerve deficit. He exhibits normal muscle tone. Coordination normal.  Skin: Skin is warm and dry. No rash noted. No erythema.  Psychiatric: He has a normal mood and affect. His behavior is normal. Judgment and thought content normal.   Depression Screen PHQ 2/9 Scores 02/14/2016  PHQ - 2 Score 0   Assessment & Plan:     Routine Health Maintenance and Physical Exam  Exercise Activities and Dietary recommendations Goals    Walk a lot at work and likes to hunt. Encouraged to exercise 30 minutes 4-5 days a week. Continue low fat diet with caffeine restriction.      Immunization History  Administered Date(s) Administered  . Influenza,inj,Quad PF,36+ Mos 12/13/2015  . Tdap  05/26/2006    Health Maintenance  Topic Date Due  . TETANUS/TDAP  05/25/2016  . COLONOSCOPY  11/12/2021  . INFLUENZA VACCINE  Completed  . Hepatitis C Screening  Completed  . HIV Screening  Completed     Discussed health benefits of physical activity, and encouraged him to engage in regular exercise appropriate for his age and condition.    -------------------------------------------------------------------- 1. Annual physical exam Good general health. Sleeping 6 hours and feeling fully rested if he continues to use the Ambien-CR 12.5 mg HS. Rarely will sleep through the night, and if he does, he does not feel rested. Immunizations are up to date. Given anticipatory guidance and will get routine labs.  2.  Peripheral polyneuropathy (HCC) Unchanged tingling and sharp pains in feet up to mid calf. When he gets busy, he will not notice discomfort/pains. Tolerating Lyrica and getting good relief. Recheck routine lab. - CBC with Differential/Platelet - Comprehensive metabolic panel - TSH  3. Gastroesophageal reflux disease, esophagitis presence not specified Good control. Uses Prilosec 40 mg qd. No hematemesis or melena. Recheck CBC. Continue reflux dietary precautions.  - CBC with Differential/Platelet  4. Hypercholesterolemia without hypertriglyceridemia No significant weight gain. Trying to watch fats in diet. Takes Omega-3 Fish oil daily. Recheck labs for assessment of progress. - Comprehensive metabolic panel - Lipid panel - TSH  5. Iron deficiency anemia, unspecified iron deficiency anemia type Continues to take iron supplement. Energy level good. No hematemesis, hematuria or hematochezia. Recheck ferritin level and CBC. - CBC with Differential/Platelet - Ferritin  6. Screening PSA (prostate specific antigen) Having a little decrease in stream strength. No nocturia or hesitancy. Check PSA. - PSA  OC-Light test of stool, negative for occult blood.  Vernie Murders, PA    [facility]

## 2016-02-17 DIAGNOSIS — E7800 Pure hypercholesterolemia, unspecified: Secondary | ICD-10-CM | POA: Diagnosis not present

## 2016-02-17 DIAGNOSIS — G629 Polyneuropathy, unspecified: Secondary | ICD-10-CM | POA: Diagnosis not present

## 2016-02-17 DIAGNOSIS — D509 Iron deficiency anemia, unspecified: Secondary | ICD-10-CM | POA: Diagnosis not present

## 2016-02-17 DIAGNOSIS — K219 Gastro-esophageal reflux disease without esophagitis: Secondary | ICD-10-CM | POA: Diagnosis not present

## 2016-02-18 ENCOUNTER — Telehealth: Payer: Self-pay

## 2016-02-18 LAB — COMPREHENSIVE METABOLIC PANEL
ALK PHOS: 66 IU/L (ref 39–117)
ALT: 18 IU/L (ref 0–44)
AST: 18 IU/L (ref 0–40)
Albumin/Globulin Ratio: 1.9 (ref 1.2–2.2)
Albumin: 4.5 g/dL (ref 3.5–5.5)
BUN/Creatinine Ratio: 13 (ref 9–20)
BUN: 14 mg/dL (ref 6–24)
Bilirubin Total: 0.4 mg/dL (ref 0.0–1.2)
CO2: 26 mmol/L (ref 18–29)
Calcium: 9.1 mg/dL (ref 8.7–10.2)
Chloride: 103 mmol/L (ref 96–106)
Creatinine, Ser: 1.07 mg/dL (ref 0.76–1.27)
GFR calc Af Amer: 88 mL/min/{1.73_m2} (ref >59)
GFR calc non Af Amer: 76 mL/min/{1.73_m2} (ref >59)
GLOBULIN, TOTAL: 2.4 g/dL (ref 1.5–4.5)
Glucose: 87 mg/dL (ref 65–99)
POTASSIUM: 4.3 mmol/L (ref 3.5–5.2)
SODIUM: 144 mmol/L (ref 134–144)
Total Protein: 6.9 g/dL (ref 6.0–8.5)

## 2016-02-18 LAB — CBC WITH DIFFERENTIAL/PLATELET
BASOS ABS: 0.1 10*3/uL (ref 0.0–0.2)
Basos: 1 %
EOS (ABSOLUTE): 0.1 10*3/uL (ref 0.0–0.4)
EOS: 1 %
HEMATOCRIT: 42 % (ref 37.5–51.0)
Hemoglobin: 14.4 g/dL (ref 13.0–17.7)
IMMATURE GRANULOCYTES: 1 %
Immature Grans (Abs): 0.1 10*3/uL (ref 0.0–0.1)
LYMPHS ABS: 1.6 10*3/uL (ref 0.7–3.1)
Lymphs: 22 %
MCH: 30.7 pg (ref 26.6–33.0)
MCHC: 34.3 g/dL (ref 31.5–35.7)
MCV: 90 fL (ref 79–97)
MONOS ABS: 0.5 10*3/uL (ref 0.1–0.9)
Monocytes: 7 %
NEUTROS PCT: 68 %
Neutrophils Absolute: 5 10*3/uL (ref 1.4–7.0)
PLATELETS: 291 10*3/uL (ref 150–379)
RBC: 4.69 x10E6/uL (ref 4.14–5.80)
RDW: 13.3 % (ref 12.3–15.4)
WBC: 7.2 10*3/uL (ref 3.4–10.8)

## 2016-02-18 LAB — LIPID PANEL
CHOLESTEROL TOTAL: 229 mg/dL — AB (ref 100–199)
Chol/HDL Ratio: 5.6 ratio units — ABNORMAL HIGH (ref 0.0–5.0)
HDL: 41 mg/dL (ref >39)
LDL CALC: 174 mg/dL — AB (ref 0–99)
TRIGLYCERIDES: 69 mg/dL (ref 0–149)
VLDL Cholesterol Cal: 14 mg/dL (ref 5–40)

## 2016-02-18 LAB — PSA: PROSTATE SPECIFIC AG, SERUM: 3.4 ng/mL (ref 0.0–4.0)

## 2016-02-18 LAB — FERRITIN: Ferritin: 188 ng/mL (ref 30–400)

## 2016-02-18 LAB — TSH: TSH: 1.79 u[IU]/mL (ref 0.450–4.500)

## 2016-02-18 NOTE — Telephone Encounter (Signed)
Advised pt of lab results. Pt verbally acknowledges understanding. FU scheduled. Fartun Paradiso Drozdowski, CMA   

## 2016-02-18 NOTE — Telephone Encounter (Signed)
-----   Message from Margo Common, [place] sent at 02/18/2016  3:01 PM EST ----- All blood tests normal except total cholesterol and LDL higher. Need low fat diet, continue Omega-3 Fish Oil and add Red Yeast Rice qd. Recheck progress in controlling cholesterol in 3 months.

## 2016-04-17 ENCOUNTER — Encounter: Payer: Self-pay | Admitting: Family Medicine

## 2016-04-17 ENCOUNTER — Ambulatory Visit (INDEPENDENT_AMBULATORY_CARE_PROVIDER_SITE_OTHER): Payer: BLUE CROSS/BLUE SHIELD | Admitting: Family Medicine

## 2016-04-17 VITALS — BP 124/78 | HR 74 | Temp 98.4°F | Resp 16 | Wt 178.6 lb

## 2016-04-17 DIAGNOSIS — R52 Pain, unspecified: Secondary | ICD-10-CM

## 2016-04-17 DIAGNOSIS — R05 Cough: Secondary | ICD-10-CM

## 2016-04-17 DIAGNOSIS — R059 Cough, unspecified: Secondary | ICD-10-CM

## 2016-04-17 LAB — POCT INFLUENZA A/B
Influenza A, POC: NEGATIVE
Influenza B, POC: NEGATIVE

## 2016-04-17 MED ORDER — OSELTAMIVIR PHOSPHATE 75 MG PO CAPS: 75.0000 mg | ORAL_CAPSULE | Freq: Two times a day (BID) | ORAL | 0 refills | Status: DC

## 2016-04-17 MED ORDER — AZITHROMYCIN 250 MG PO TABS: ORAL_TABLET | ORAL | 0 refills | Status: DC

## 2016-04-17 MED ORDER — HYDROCODONE-HOMATROPINE 5-1.5 MG/5ML PO SYRP: 5.0000 mL | ORAL_SOLUTION | Freq: Three times a day (TID) | ORAL | 0 refills | Status: DC | PRN

## 2016-04-17 NOTE — Progress Notes (Signed)
Patient: Dale Gray Male    DOB: [birth year]   61 y.o.   MRN: [id number] Visit Date: 04/17/2016  Today's Provider: Vernie Murders, PA   Chief Complaint  Patient presents with  . URI   Subjective:    URI   This is a new problem. The current episode started today. There has been no fever. Associated symptoms include congestion, coughing and headaches. Associated symptoms comments: Chills, body aches . He has tried decongestant for the symptoms. The treatment provided moderate relief.   Past Medical History:  Diagnosis Date  . Hemorrhoid   . Hyperlipidemia   . Joint pain   . Reflux    Patient Active Problem List   Diagnosis Date Noted  . Peripheral neuropathy ([place]) 01/24/2015  . Anal fissure 01/22/2015  . Disorder of coccyx 01/22/2015  . History of digestive disease 01/22/2015  . HLD (hyperlipidemia) 01/22/2015  . Anemia, iron deficiency 01/22/2015  . Yeast dermatitis 06/22/2012  . Hemorrhoid   . Fam hx-ischem heart disease 07/20/2007  . Acid reflux 05/26/2006  . Cannot sleep 10/12/2005  . Hypercholesterolemia without hypertriglyceridemia 10/12/2005   Past Surgical History:  Procedure Laterality Date  . ANAL FISSURE REPAIR    . TYMPANOPLASTY Left 04/30/2014  . VASECTOMY  01/06/2002   Family History  Problem Relation Age of Onset  . Heart disease Mother   . Asthma Mother   . Heart disease Father   . CAD Father   . Healthy Sister   . Healthy Brother   . Healthy Daughter   . Diabetes Maternal Aunt   . Diabetes Maternal Uncle   . Healthy Daughter    Allergies  Allergen Reactions  . Lovastatin     Sleep disturbance and extremity pains     Previous Medications   ASPIRIN 81 MG TABLET    Take 81 mg by mouth daily.   FERROUS SULFATE 325 (65 FE) MG TABLET    Take by mouth.   FISH OIL-OMEGA-3 FATTY ACIDS 1000 MG CAPSULE    Take 2 g by mouth daily.   OMEPRAZOLE (PRILOSEC) 40 MG CAPSULE    TAKE (1) CAPSULE BY MOUTH ONCE DAILY.   PREGABALIN (LYRICA) 100 MG  CAPSULE    TAKE (1) CAPSULE BY MOUTH THREE TIMES DAILY   THIAMINE 100 MG TABLET    Take by mouth.   ZOLPIDEM (AMBIEN CR) 12.5 MG CR TABLET    Take 1 tablet (12.5 mg total) by mouth at bedtime as needed.    Review of Systems  Constitutional: Positive for chills.  HENT: Positive for congestion.   Respiratory: Positive for cough.   Cardiovascular: Negative.   Musculoskeletal: Positive for myalgias.  Neurological: Positive for headaches.    Social History  Substance Use Topics  . Smoking status: Never Smoker  . Smokeless tobacco: Never Used  . Alcohol use No   Objective:   BP 124/78 (BP Location: Right Arm, Patient Position: Sitting, Cuff Size: Normal)   Pulse 74   Temp 98.4 F (36.9 C) (Oral)   Resp 16   Wt 178 lb 9.6 oz (81 kg)   SpO2 98%   BMI 24.91 kg/m   Physical Exam  Constitutional: He is oriented to person, place, and time. He appears well-developed and well-nourished. No distress.  HENT:  Head: Normocephalic and atraumatic.  Right Ear: Hearing and external ear normal.  Left Ear: Hearing and external ear normal.  Nose: Nose normal.  Only slight irritation to posterior pharynx with PND.  Eyes: Conjunctivae  and lids are normal. Right eye exhibits no discharge. Left eye exhibits no discharge. No scleral icterus.  Neck: Neck supple.  Cardiovascular: Normal rate and regular rhythm.   Pulmonary/Chest: Effort normal and breath sounds normal. No respiratory distress.  Musculoskeletal: Normal range of motion.  Lymphadenopathy:    He has no cervical adenopathy.  Neurological: He is alert and oriented to person, place, and time.  Skin: Skin is intact. No lesion and no rash noted.  Psychiatric: He has a normal mood and affect. His speech is normal and behavior is normal. Thought content normal.      Assessment & Plan:     1. Body aches Onset yesterday evening with chills and cough. No documented fever and flu test negative. General malaise but no sore throat yet. Had  some exposure to a co-worker that had "the flu" last week or two. Suspect early influenza. Will treat with Tamiflu, increased fluids and home to rest. Recheck prn. - POCT Influenza A/B - oseltamivir (TAMIFLU) 75 MG capsule; Take 1 capsule (75 mg total) by mouth 2 (two) times daily.  Dispense: 10 capsule; Refill: 0  2. Cough Slight clear sputum this morning. With suspected influenza and sudden chills, will give Hycodan for cough and headache with Azithromycin to start if purulent sputum develops. Empiric treatment for pneumonia. Recheck prn. - HYDROcodone-homatropine (HYCODAN) 5-1.5 MG/5ML syrup; Take 5 mLs by mouth every 8 (eight) hours as needed for cough.  Dispense: 120 mL; Refill: 0 - azithromycin (ZITHROMAX) 250 MG tablet; Take 2 tablets by mouth the first day then one daily for 4 days.  Dispense: 6 tablet; Refill: 0

## 2016-04-17 NOTE — Patient Instructions (Signed)

## 2016-05-11 ENCOUNTER — Telehealth: Payer: Self-pay | Admitting: Family Medicine

## 2016-05-11 ENCOUNTER — Other Ambulatory Visit: Payer: Self-pay | Admitting: Family Medicine

## 2016-05-11 DIAGNOSIS — F5101 Primary insomnia: Secondary | ICD-10-CM

## 2016-05-11 MED ORDER — ZOLPIDEM TARTRATE ER 12.5 MG PO TBCR: 12.5000 mg | EXTENDED_RELEASE_TABLET | Freq: Every evening | ORAL | 3 refills | Status: DC | PRN

## 2016-05-11 NOTE — Telephone Encounter (Signed)
Patient needs refills on Ambien.  HE IS TOTALLY OUT and has no refills.

## 2016-06-03 ENCOUNTER — Telehealth: Payer: Self-pay | Admitting: Family Medicine

## 2016-06-03 NOTE — Telephone Encounter (Signed)
Patient has an appt. On 06/08/16 for followup on his cholesterol.    Does he need to have labwork done before his appt. To check his labs??  Please let him know today so he can get them on Friday.

## 2016-06-03 NOTE — Telephone Encounter (Signed)
Doesn't have to since he will be here early Monday and for only a short visit. Can get this done the same time as the appointment and save a trip over here Friday.

## 2016-06-08 ENCOUNTER — Encounter: Payer: Self-pay | Admitting: Family Medicine

## 2016-06-08 ENCOUNTER — Ambulatory Visit (INDEPENDENT_AMBULATORY_CARE_PROVIDER_SITE_OTHER): Payer: BLUE CROSS/BLUE SHIELD | Admitting: Family Medicine

## 2016-06-08 VITALS — BP 100/64 | HR 56 | Temp 97.7°F | Resp 16

## 2016-06-08 DIAGNOSIS — G629 Polyneuropathy, unspecified: Secondary | ICD-10-CM

## 2016-06-08 DIAGNOSIS — E7800 Pure hypercholesterolemia, unspecified: Secondary | ICD-10-CM | POA: Diagnosis not present

## 2016-06-08 MED ORDER — PREGABALIN 100 MG PO CAPS: 100.0000 mg | ORAL_CAPSULE | Freq: Every day | ORAL | 1 refills | Status: DC

## 2016-06-08 NOTE — Progress Notes (Signed)
Patient: Dale Gray Male    DOB: 1955-03-29   61 y.o.   MRN: [id number] Visit Date: 06/08/2016  Today's Provider: Vernie Murders, PA   Chief Complaint  Patient presents with  . Hyperlipidemia  . Peripheral Neuropathy   Subjective:    HPI      Lipid/Cholesterol, Follow-up:   Last seen for this4 months ago.  Management changes since that visit include adding red yeast rice, and starting low fat diet. . Last Lipid Panel:    Component Value Date/Time   CHOL 229 (H) 02/17/2016 0951   TRIG 69 02/17/2016 0951   HDL 41 02/17/2016 0951   CHOLHDL 5.6 (H) 02/17/2016 0951   LDLCALC 174 (H) 02/17/2016 9678    Risk factors for vascular disease include hypercholesterolemia  He reports fair compliance with treatment. Pt has started low fat diet, but did not start Red Yeast Rice. Current diet: in general, a "healthy" diet   Current exercise: physically active job  Wt Readings from Last 3 Encounters:  04/17/16 178 lb 9.6 oz (81 kg)  02/14/16 176 lb 9.6 oz (80.1 kg)  08/13/15 173 lb (78.5 kg)    ------------------------------------------------------------------- Peripheral Neuropathy Pt needs refill of Lyrica. Pt reports good compliance with treatment, and good symptom control.  Allergies  Allergen Reactions  . Lovastatin     Sleep disturbance and extremity pains   Patient Active Problem List   Diagnosis Date Noted  . Peripheral neuropathy ([place]) 01/24/2015  . Anal fissure 01/22/2015  . Disorder of coccyx 01/22/2015  . History of digestive disease 01/22/2015  . HLD (hyperlipidemia) 01/22/2015  . Anemia, iron deficiency 01/22/2015  . Yeast dermatitis 06/22/2012  . Hemorrhoid   . Fam hx-ischem heart disease 07/20/2007  . Acid reflux 05/26/2006  . Cannot sleep 10/12/2005  . Hypercholesterolemia without hypertriglyceridemia 10/12/2005   Past Surgical History:  Procedure Laterality Date  . ANAL FISSURE REPAIR    . TYMPANOPLASTY Left 04/30/2014  .  VASECTOMY  01/06/2002   Family History  Problem Relation Age of Onset  . Heart disease Mother   . Asthma Mother   . Heart disease Father   . CAD Father   . Healthy Sister   . Healthy Brother   . Healthy Daughter   . Healthy Daughter   . Diabetes Maternal Aunt   . Diabetes Maternal Uncle    Allergies  Allergen Reactions  . Lovastatin     Sleep disturbance and extremity pains    Current Outpatient Prescriptions:  .  aspirin 81 MG tablet, Take 81 mg by mouth daily., Disp: , Rfl:  .  cyanocobalamin 1000 MCG tablet, Take 1,000 mcg by mouth daily., Disp: , Rfl:  .  ferrous sulfate 325 (65 FE) MG tablet, Take by mouth., Disp: , Rfl:  .  fish oil-omega-3 fatty acids 1000 MG capsule, Take 2 g by mouth daily., Disp: , Rfl:  .  omeprazole (PRILOSEC) 40 MG capsule, TAKE (1) CAPSULE BY MOUTH ONCE DAILY., Disp: 90 capsule, Rfl: 3 .  pregabalin (LYRICA) 100 MG capsule, TAKE (1) CAPSULE BY MOUTH THREE TIMES DAILY, Disp: 90 capsule, Rfl: 1 .  zolpidem (AMBIEN CR) 12.5 MG CR tablet, Take 1 tablet (12.5 mg total) by mouth at bedtime as needed., Disp: 30 tablet, Rfl: 3  Review of Systems  Constitutional: Negative for activity change, appetite change, chills, diaphoresis, fatigue, fever and unexpected weight change.  Respiratory: Negative for cough.   Cardiovascular: Negative for chest pain, palpitations and leg  swelling.  Neurological: Positive for numbness (neuropathy).    Social History  Substance Use Topics  . Smoking status: Never Smoker  . Smokeless tobacco: Never Used  . Alcohol use No   Objective:   BP 100/64 (BP Location: Right Arm, Patient Position: Sitting, Cuff Size: Normal)   Pulse (!) 56   Temp 97.7 F (36.5 C) (Oral)   Resp 16  Vitals:   06/08/16 0809  BP: 100/64  Pulse: (!) 56  Resp: 16  Temp: 97.7 F (36.5 C)  TempSrc: Oral   Physical Exam  Constitutional: He is oriented to person, place, and time. He appears well-developed and well-nourished. No distress.    HENT:  Head: Normocephalic and atraumatic.  Right Ear: Hearing normal.  Left Ear: Hearing normal.  Nose: Nose normal.  Eyes: Conjunctivae and lids are normal. Right eye exhibits no discharge. Left eye exhibits no discharge. No scleral icterus.  Neck: Neck supple.  Cardiovascular: Normal rate and regular rhythm.   Pulmonary/Chest: Effort normal and breath sounds normal. No respiratory distress.  Abdominal: Soft. Bowel sounds are normal.  Musculoskeletal: Normal range of motion.  Neurological: He is alert and oriented to person, place, and time.  Burning pain and numbness in left lower leg all day and worse at night. Essentially unchanged. No lesions or sores noticed.  Skin: Skin is intact. No lesion and no rash noted.  Psychiatric: He has a normal mood and affect. His speech is normal and behavior is normal. Thought content normal.      Assessment & Plan:     1. Hypercholesterolemia without hypertriglyceridemia Has followed the low fat diet well and cannot take statins. Will recheck labs and follow up pending reports. - CBC with Differential/Platelet - Comprehensive metabolic panel - Lipid panel  2. Peripheral polyneuropathy ([place]) Years of peripheral neuropathy unchanged. Lyrica helps to keep the burning and numbness in the left foot up to mid calf tolerable. Less discomfort when active versus at rest. Some slight symptoms in the right foot also. Will recheck labs and refill Lyrica. Recheck pending reports. - CBC with Differential/Platelet - Comprehensive metabolic panel - pregabalin (LYRICA) 100 MG capsule; Take 1 capsule (100 mg total) by mouth daily.  Dispense: 90 capsule; Refill: 1     Patient seen and examined by Vernie Murders, PA, and note scribed by Renaldo Fiddler, CMA.  Vernie Murders, PA  [facility]

## 2016-06-09 LAB — COMPREHENSIVE METABOLIC PANEL
ALBUMIN: 4.6 g/dL (ref 3.5–5.5)
ALK PHOS: 66 IU/L (ref 39–117)
ALT: 13 IU/L (ref 0–44)
AST: 20 IU/L (ref 0–40)
Albumin/Globulin Ratio: 1.8 (ref 1.2–2.2)
BUN / CREAT RATIO: 12 (ref 9–20)
BUN: 14 mg/dL (ref 6–24)
Bilirubin Total: 0.7 mg/dL (ref 0.0–1.2)
CHLORIDE: 102 mmol/L (ref 96–106)
CO2: 25 mmol/L (ref 18–29)
Calcium: 9.3 mg/dL (ref 8.7–10.2)
Creatinine, Ser: 1.15 mg/dL (ref 0.76–1.27)
GFR calc Af Amer: 81 mL/min/{1.73_m2} (ref >59)
GFR calc non Af Amer: 70 mL/min/{1.73_m2} (ref >59)
GLOBULIN, TOTAL: 2.5 g/dL (ref 1.5–4.5)
Glucose: 96 mg/dL (ref 65–99)
POTASSIUM: 4.7 mmol/L (ref 3.5–5.2)
SODIUM: 142 mmol/L (ref 134–144)
TOTAL PROTEIN: 7.1 g/dL (ref 6.0–8.5)

## 2016-06-09 LAB — CBC WITH DIFFERENTIAL/PLATELET
BASOS: 1 %
Basophils Absolute: 0.1 10*3/uL (ref 0.0–0.2)
EOS (ABSOLUTE): 0.3 10*3/uL (ref 0.0–0.4)
EOS: 6 %
HEMATOCRIT: 43.6 % (ref 37.5–51.0)
HEMOGLOBIN: 14.4 g/dL (ref 13.0–17.7)
IMMATURE GRANS (ABS): 0 10*3/uL (ref 0.0–0.1)
Immature Granulocytes: 0 %
LYMPHS ABS: 1.6 10*3/uL (ref 0.7–3.1)
LYMPHS: 28 %
MCH: 30.1 pg (ref 26.6–33.0)
MCHC: 33 g/dL (ref 31.5–35.7)
MCV: 91 fL (ref 79–97)
MONOCYTES: 7 %
Monocytes Absolute: 0.4 10*3/uL (ref 0.1–0.9)
NEUTROS ABS: 3.3 10*3/uL (ref 1.4–7.0)
Neutrophils: 58 %
Platelets: 284 10*3/uL (ref 150–379)
RBC: 4.78 x10E6/uL (ref 4.14–5.80)
RDW: 13.8 % (ref 12.3–15.4)
WBC: 5.7 10*3/uL (ref 3.4–10.8)

## 2016-06-09 LAB — LIPID PANEL
CHOLESTEROL TOTAL: 236 mg/dL — AB (ref 100–199)
Chol/HDL Ratio: 4.7 ratio (ref 0.0–5.0)
HDL: 50 mg/dL (ref >39)
LDL Calculated: 173 mg/dL — ABNORMAL HIGH (ref 0–99)
Triglycerides: 63 mg/dL (ref 0–149)
VLDL Cholesterol Cal: 13 mg/dL (ref 5–40)

## 2016-07-07 ENCOUNTER — Other Ambulatory Visit: Payer: Self-pay | Admitting: Family Medicine

## 2016-08-09 ENCOUNTER — Other Ambulatory Visit: Payer: Self-pay | Admitting: Family Medicine

## 2016-08-10 ENCOUNTER — Telehealth: Payer: Self-pay | Admitting: Family Medicine

## 2016-08-10 NOTE — Telephone Encounter (Signed)
Please call in refills for Omeprazole to [facility] in [place]

## 2016-08-10 NOTE — Telephone Encounter (Signed)
Sent refills to the pharmacy at 7:44 am today.

## 2016-09-01 ENCOUNTER — Other Ambulatory Visit: Payer: Self-pay | Admitting: Family Medicine

## 2016-09-01 DIAGNOSIS — G629 Polyneuropathy, unspecified: Secondary | ICD-10-CM

## 2016-09-08 ENCOUNTER — Other Ambulatory Visit: Payer: Self-pay | Admitting: Family Medicine

## 2016-09-08 NOTE — Telephone Encounter (Signed)
Please phone in refills as listed in chart. Left reminder on prescription for the patient to recheck lipids this month.

## 2016-09-08 NOTE — Telephone Encounter (Signed)
RX called in at [facility]. Left message for pharmacist to advise patient to call office to schedule a follow up appointment this month.

## 2016-10-13 ENCOUNTER — Ambulatory Visit (INDEPENDENT_AMBULATORY_CARE_PROVIDER_SITE_OTHER): Payer: BLUE CROSS/BLUE SHIELD | Admitting: Family Medicine

## 2016-10-13 ENCOUNTER — Encounter: Payer: Self-pay | Admitting: Family Medicine

## 2016-10-13 VITALS — BP 110/68 | HR 48 | Temp 97.6°F | Resp 12 | Wt 169.0 lb

## 2016-10-13 DIAGNOSIS — D509 Iron deficiency anemia, unspecified: Secondary | ICD-10-CM | POA: Diagnosis not present

## 2016-10-13 DIAGNOSIS — E7800 Pure hypercholesterolemia, unspecified: Secondary | ICD-10-CM

## 2016-10-13 DIAGNOSIS — R5383 Other fatigue: Secondary | ICD-10-CM | POA: Diagnosis not present

## 2016-10-13 DIAGNOSIS — R5381 Other malaise: Secondary | ICD-10-CM

## 2016-10-13 NOTE — Progress Notes (Signed)
Dale Gray  MRN: [id number] DOB: 04-02-1955  Subjective:  HPI  Patient is here to discuss low energy level for the past 5 to 6 days. Just tired all the time. No other symptoms per patient. Sleeps well and feels refreshed when he wakes up in the morning. No recent tick bites. He does have history of iron deficiency and use to take Iron supplements for this. Last level checked in April 2018 was normal.  Past Surgical History:  Procedure Laterality Date  . ANAL FISSURE REPAIR    . TYMPANOPLASTY Left 04/30/2014  . VASECTOMY  01/06/2002   Family History  Problem Relation Age of Onset  . Heart disease Mother   . Asthma Mother   . Heart disease Father   . CAD Father   . Healthy Sister   . Healthy Brother   . Healthy Daughter   . Healthy Daughter   . Diabetes Maternal Aunt   . Diabetes Maternal Uncle    Wt Readings from Last 3 Encounters:  10/13/16 169 lb (76.7 kg)  04/17/16 178 lb 9.6 oz (81 kg)  02/14/16 176 lb 9.6 oz (80.1 kg)   BP Readings from Last 3 Encounters:  10/13/16 110/68  06/08/16 100/64  04/17/16 124/78   Patient Active Problem List   Diagnosis Date Noted  . Peripheral neuropathy 01/24/2015  . Anal fissure 01/22/2015  . Disorder of coccyx 01/22/2015  . History of digestive disease 01/22/2015  . HLD (hyperlipidemia) 01/22/2015  . Anemia, iron deficiency 01/22/2015  . Yeast dermatitis 06/22/2012  . Hemorrhoid   . Fam hx-ischem heart disease 07/20/2007  . Acid reflux 05/26/2006  . Cannot sleep 10/12/2005  . Hypercholesterolemia without hypertriglyceridemia 10/12/2005    Past Medical History:  Diagnosis Date  . Hemorrhoid   . Hyperlipidemia   . Joint pain   . Reflux     Social History   Social History  . Marital status: Married    Spouse name: N/A  . Number of children: N/A  . Years of education: N/A   Occupational History  . Not on file.   Social History Main Topics  . Smoking status: Never Smoker  . Smokeless tobacco: Never Used    . Alcohol use No  . Drug use: No  . Sexual activity: Not on file   Other Topics Concern  . Not on file   Social History Narrative  . No narrative on file    Outpatient Encounter Prescriptions as of 10/13/2016  Medication Sig Note  . aspirin 81 MG tablet Take 81 mg by mouth daily.   . cyanocobalamin 1000 MCG tablet Take 1,000 mcg by mouth daily.   . fish oil-omega-3 fatty acids 1000 MG capsule Take 2 g by mouth daily.   Marland Kitchen LYRICA 100 MG capsule TAKE (1) CAPSULE BY MOUTH THREE TIMES DAILY   . omeprazole (PRILOSEC) 40 MG capsule TAKE (1) CAPSULE BY MOUTH ONCE DAILY.   Marland Kitchen zolpidem (AMBIEN CR) 12.5 MG CR tablet TAKE 1 TABLET BY MOUTH AT BEDTIME AS NEEDED.   . [DISCONTINUED] ferrous sulfate 325 (65 FE) MG tablet Take by mouth. 01/22/2015: Received from: [facility]   No facility-administered encounter medications on file as of 10/13/2016.     Allergies  Allergen Reactions  . Lovastatin     Sleep disturbance and extremity pains    Review of Systems  Constitutional: Positive for malaise/fatigue.  Respiratory: Negative.   Cardiovascular: Negative.   Gastrointestinal: Negative.   Genitourinary: Negative.   Musculoskeletal: Negative.  Neurological: Negative.   Psychiatric/Behavioral: Negative.     Objective:  BP 110/68   Pulse (!) 48   Temp 97.6 F (36.4 C)   Resp 12   Wt 169 lb (76.7 kg)   SpO2 99%   BMI 23.57 kg/m   Physical Exam  Constitutional: He is oriented to person, place, and time and well-developed, well-nourished, and in no distress.  HENT:  Head: Normocephalic.  Right Ear: External ear normal.  Left Ear: External ear normal.  Nose: Nose normal.  Mouth/Throat: Oropharynx is clear and moist.  Eyes: Conjunctivae are normal.  Neck: Neck supple. No thyromegaly present.  Cardiovascular: Normal rate and regular rhythm.   Pulmonary/Chest: Effort normal and breath sounds normal.  Abdominal: Soft. Bowel sounds are normal.  Musculoskeletal: Normal  range of motion.  Lymphadenopathy:    He has no cervical adenopathy.  Neurological: He is alert and oriented to person, place, and time.  Skin: No rash noted.  Psychiatric: Memory, affect and judgment normal.    Assessment and Plan :  1. Malaise and fatigue Lost of energy level over the past week. No dizziness, headache, muscle weakness, GI upset, nocturia or frequency. Has a history of iron deficiency. Still taking Vitamin B12 daily but no longer taking iron supplement. No chest pains, dyspnea or palpitations. Will check labs and follow up pending reports. - CBC with Differential/Platelet - Comprehensive metabolic panel - Iron - Iron and TIBC - Methylmalonic Acid  2. Iron deficiency anemia, unspecified iron deficiency anemia type Last Hgb was 14.4 with Hct 43.6 on 06-08-16. Stopped use of iron supplement at that time. No melena, hematemesis, hematochezia or hematuria. Will check labs for follow up. - CBC with Differential/Platelet - Comprehensive metabolic panel - Iron - Iron and TIBC - Methylmalonic Acid  3. Hypercholesterolemia without hypertriglyceridemia Trying to follow low fat diet. Still taking Omega-3 Fish Oil and has lost 9 lbs since Feb. 2018. Recheck lipids and CMP. - Lipid panel

## 2016-10-15 LAB — COMPREHENSIVE METABOLIC PANEL WITH GFR
ALT: 14 IU/L (ref 0–44)
AST: 20 IU/L (ref 0–40)
Albumin/Globulin Ratio: 1.8 (ref 1.2–2.2)
Albumin: 4.6 g/dL (ref 3.5–5.5)
Alkaline Phosphatase: 63 IU/L (ref 39–117)
BUN/Creatinine Ratio: 13 (ref 9–20)
BUN: 15 mg/dL (ref 6–24)
Bilirubin Total: 0.7 mg/dL (ref 0.0–1.2)
CO2: 25 mmol/L (ref 20–29)
Calcium: 9.6 mg/dL (ref 8.7–10.2)
Chloride: 102 mmol/L (ref 96–106)
Creatinine, Ser: 1.17 mg/dL (ref 0.76–1.27)
GFR calc Af Amer: 79 mL/min/1.73
GFR calc non Af Amer: 68 mL/min/1.73
Globulin, Total: 2.5 g/dL (ref 1.5–4.5)
Glucose: 90 mg/dL (ref 65–99)
Potassium: 4.3 mmol/L (ref 3.5–5.2)
Sodium: 144 mmol/L (ref 134–144)
Total Protein: 7.1 g/dL (ref 6.0–8.5)

## 2016-10-15 LAB — CBC WITH DIFFERENTIAL/PLATELET
Basophils Absolute: 0 x10E3/uL (ref 0.0–0.2)
Basos: 1 %
EOS (ABSOLUTE): 0.4 x10E3/uL (ref 0.0–0.4)
Eos: 6 %
Hematocrit: 42.4 % (ref 37.5–51.0)
Hemoglobin: 14.1 g/dL (ref 13.0–17.7)
Immature Grans (Abs): 0 x10E3/uL (ref 0.0–0.1)
Immature Granulocytes: 1 %
Lymphocytes Absolute: 1.9 x10E3/uL (ref 0.7–3.1)
Lymphs: 30 %
MCH: 29.5 pg (ref 26.6–33.0)
MCHC: 33.3 g/dL (ref 31.5–35.7)
MCV: 89 fL (ref 79–97)
Monocytes Absolute: 0.5 x10E3/uL (ref 0.1–0.9)
Monocytes: 8 %
Neutrophils Absolute: 3.5 x10E3/uL (ref 1.4–7.0)
Neutrophils: 54 %
Platelets: 298 x10E3/uL (ref 150–379)
RBC: 4.78 x10E6/uL (ref 4.14–5.80)
RDW: 13.8 % (ref 12.3–15.4)
WBC: 6.3 x10E3/uL (ref 3.4–10.8)

## 2016-10-15 LAB — IRON AND TIBC
Iron Saturation: 29 % (ref 15–55)
Iron: 86 ug/dL (ref 38–169)
TIBC: 295 ug/dL (ref 250–450)
UIBC: 209 ug/dL (ref 111–343)

## 2016-10-15 LAB — LIPID PANEL
CHOL/HDL RATIO: 5.2 ratio — AB (ref 0.0–5.0)
Cholesterol, Total: 243 mg/dL — ABNORMAL HIGH (ref 100–199)
HDL: 47 mg/dL (ref >39)
LDL CALC: 179 mg/dL — AB (ref 0–99)
Triglycerides: 84 mg/dL (ref 0–149)
VLDL CHOLESTEROL CAL: 17 mg/dL (ref 5–40)

## 2016-10-15 LAB — METHYLMALONIC ACID, SERUM: Methylmalonic Acid: 85 nmol/L (ref 0–378)

## 2016-10-16 ENCOUNTER — Telehealth: Payer: Self-pay

## 2016-10-16 MED ORDER — EZETIMIBE 10 MG PO TABS: 10.0000 mg | ORAL_TABLET | Freq: Every day | ORAL | 3 refills | Status: DC

## 2016-10-16 NOTE — Telephone Encounter (Signed)
-----   Message from Margo Common, [place] sent at 10/15/2016  1:09 PM EDT ----- All blood tests normal except total cholesterol and LDL levels are high. Unable to take statins due to side effect reaction. Recommend Zetia 10 mg qd #30 & 3RF. Iron, hemoglobin and Vitamin B12 levels are normal. Recommend a multivitamin with minerals once a day and recheck cholesterol in 3 months.

## 2016-10-16 NOTE — Telephone Encounter (Signed)
Advised patient of results. Medication was sent into the pharmacy.  

## 2016-11-11 DIAGNOSIS — H90A32 Mixed conductive and sensorineural hearing loss, unilateral, left ear with restricted hearing on the contralateral side: Secondary | ICD-10-CM | POA: Diagnosis not present

## 2016-11-26 ENCOUNTER — Ambulatory Visit (INDEPENDENT_AMBULATORY_CARE_PROVIDER_SITE_OTHER): Payer: BLUE CROSS/BLUE SHIELD

## 2016-11-26 DIAGNOSIS — Z23 Encounter for immunization: Secondary | ICD-10-CM | POA: Diagnosis not present

## 2016-12-08 ENCOUNTER — Telehealth: Payer: Self-pay | Admitting: Family Medicine

## 2016-12-08 ENCOUNTER — Other Ambulatory Visit: Payer: Self-pay | Admitting: Family Medicine

## 2016-12-08 NOTE — Telephone Encounter (Signed)
Refilled through the pharmacy e-mail request this morning.

## 2016-12-08 NOTE — Telephone Encounter (Signed)
Needs refills  On Omeprazole 40 mg. To [facility]

## 2017-01-06 ENCOUNTER — Other Ambulatory Visit: Payer: Self-pay | Admitting: Family Medicine

## 2017-01-07 NOTE — Telephone Encounter (Signed)
RX called in at [facility]

## 2017-01-07 NOTE — Telephone Encounter (Signed)
Phone in refills for Zolpidem.

## 2017-02-15 ENCOUNTER — Encounter: Payer: BLUE CROSS/BLUE SHIELD | Admitting: Family Medicine

## 2017-02-16 ENCOUNTER — Encounter: Payer: BLUE CROSS/BLUE SHIELD | Admitting: Family Medicine

## 2017-02-18 ENCOUNTER — Encounter: Payer: Self-pay | Admitting: Family Medicine

## 2017-02-18 ENCOUNTER — Ambulatory Visit (INDEPENDENT_AMBULATORY_CARE_PROVIDER_SITE_OTHER): Payer: BLUE CROSS/BLUE SHIELD | Admitting: Family Medicine

## 2017-02-18 VITALS — BP 122/80 | HR 55 | Temp 97.7°F | Resp 16 | Ht 70.5 in | Wt 179.2 lb

## 2017-02-18 DIAGNOSIS — Z23 Encounter for immunization: Secondary | ICD-10-CM | POA: Diagnosis not present

## 2017-02-18 DIAGNOSIS — Z1211 Encounter for screening for malignant neoplasm of colon: Secondary | ICD-10-CM

## 2017-02-18 DIAGNOSIS — G629 Polyneuropathy, unspecified: Secondary | ICD-10-CM

## 2017-02-18 DIAGNOSIS — K602 Anal fissure, unspecified: Secondary | ICD-10-CM | POA: Diagnosis not present

## 2017-02-18 DIAGNOSIS — Z0000 Encounter for general adult medical examination without abnormal findings: Secondary | ICD-10-CM | POA: Diagnosis not present

## 2017-02-18 DIAGNOSIS — E7800 Pure hypercholesterolemia, unspecified: Secondary | ICD-10-CM

## 2017-02-18 LAB — CBC WITH DIFFERENTIAL/PLATELET
BASOS ABS: 53 {cells}/uL (ref 0–200)
Basophils Relative: 0.8 %
EOS ABS: 112 {cells}/uL (ref 15–500)
Eosinophils Relative: 1.7 %
HEMATOCRIT: 41.8 % (ref 38.5–50.0)
Hemoglobin: 14.2 g/dL (ref 13.2–17.1)
LYMPHS ABS: 1498 {cells}/uL (ref 850–3900)
MCH: 30.5 pg (ref 27.0–33.0)
MCHC: 34 g/dL (ref 32.0–36.0)
MCV: 89.7 fL (ref 80.0–100.0)
MPV: 9.8 fL (ref 7.5–12.5)
Monocytes Relative: 8.8 %
Neutro Abs: 4356 cells/uL (ref 1500–7800)
Neutrophils Relative %: 66 %
PLATELETS: 325 10*3/uL (ref 140–400)
RBC: 4.66 10*6/uL (ref 4.20–5.80)
RDW: 12.4 % (ref 11.0–15.0)
TOTAL LYMPHOCYTE: 22.7 %
WBC: 6.6 10*3/uL (ref 3.8–10.8)
WBCMIX: 581 {cells}/uL (ref 200–950)

## 2017-02-18 LAB — LIPID PANEL
CHOLESTEROL: 203 mg/dL — AB (ref <200)
HDL: 54 mg/dL (ref >40)
LDL CHOLESTEROL (CALC): 130 mg/dL — AB
Non-HDL Cholesterol (Calc): 149 mg/dL (calc) — ABNORMAL HIGH (ref <130)
TRIGLYCERIDES: 93 mg/dL (ref <150)
Total CHOL/HDL Ratio: 3.8 (calc) (ref <5.0)

## 2017-02-18 LAB — COMPLETE METABOLIC PANEL WITH GFR
AG Ratio: 1.8 (calc) (ref 1.0–2.5)
ALBUMIN MSPROF: 4.6 g/dL (ref 3.6–5.1)
ALKALINE PHOSPHATASE (APISO): 56 U/L (ref 40–115)
ALT: 27 U/L (ref 9–46)
AST: 27 U/L (ref 10–35)
BILIRUBIN TOTAL: 0.9 mg/dL (ref 0.2–1.2)
BUN: 14 mg/dL (ref 7–25)
CO2: 29 mmol/L (ref 20–32)
CREATININE: 1.02 mg/dL (ref 0.70–1.33)
Calcium: 9.5 mg/dL (ref 8.6–10.3)
Chloride: 103 mmol/L (ref 98–110)
GFR, EST AFRICAN AMERICAN: 93 mL/min/{1.73_m2} (ref >=60)
GFR, Est Non African American: 80 mL/min/{1.73_m2} (ref >=60)
GLOBULIN: 2.5 g/dL (ref 1.9–3.7)
Glucose, Bld: 97 mg/dL (ref 65–99)
Potassium: 4.3 mmol/L (ref 3.5–5.3)
SODIUM: 139 mmol/L (ref 135–146)
TOTAL PROTEIN: 7.1 g/dL (ref 6.1–8.1)

## 2017-02-18 NOTE — Progress Notes (Signed)
Patient: Dale Gray, Male    DOB: 12-Jan-1956, 61 y.o.   MRN: [id number] Visit Date: 02/18/2017  Today's Provider: Vernie Murders, PA   Chief Complaint  Patient presents with  . Annual Exam   Subjective:    Annual physical exam Dale Gray is a 61 y.o. male who presents today for health maintenance and complete physical. He feels well. He reports exercising at work daily. He reports he is sleeping well with Ambien.  -----------------------------------------------------------------  Review of Systems  Constitutional: Negative.   HENT: Positive for hearing loss.   Eyes: Negative.   Respiratory: Negative.   Cardiovascular: Negative.   Gastrointestinal: Negative.   Endocrine: Negative.   Genitourinary: Negative.   Musculoskeletal: Negative.        Right leg pain X 3 weeks  Skin: Negative.   Allergic/Immunologic: Negative.   Neurological: Negative.   Hematological: Negative.   Psychiatric/Behavioral: Negative.     Social History      He  reports that  has never smoked. he has never used smokeless tobacco. He reports that he does not drink alcohol or use drugs.       Social History   Socioeconomic History  . Marital status: Married    Spouse name: None  . Number of children: None  . Years of education: None  . Highest education level: None  Social Needs  . Financial resource strain: None  . Food insecurity - worry: None  . Food insecurity - inability: None  . Transportation needs - medical: None  . Transportation needs - non-medical: None  Occupational History  . None  Tobacco Use  . Smoking status: Never Smoker  . Smokeless tobacco: Never Used  Substance and Sexual Activity  . Alcohol use: No  . Drug use: No  . Sexual activity: None  Other Topics Concern  . None  Social History Narrative  . None    Past Medical History:  Diagnosis Date  . Hemorrhoid   . Hyperlipidemia   . Joint pain   . Reflux      Patient Active Problem List     Diagnosis Date Noted  . Peripheral neuropathy 01/24/2015  . Anal fissure 01/22/2015  . Disorder of coccyx 01/22/2015  . History of digestive disease 01/22/2015  . HLD (hyperlipidemia) 01/22/2015  . Anemia, iron deficiency 01/22/2015  . Yeast dermatitis 06/22/2012  . Hemorrhoid   . Fam hx-ischem heart disease 07/20/2007  . Acid reflux 05/26/2006  . Cannot sleep 10/12/2005  . Hypercholesterolemia without hypertriglyceridemia 10/12/2005    Past Surgical History:  Procedure Laterality Date  . ANAL FISSURE REPAIR    . TYMPANOPLASTY Left 04/30/2014  . VASECTOMY  01/06/2002    Family History        Family Status  Relation Name Status  . Mother  Alive  . Father  Alive  . Sister  Alive  . Brother  Alive  . Daughter  Alive  . Daughter  Alive  . Mat Aunt  (Not Specified)  . Mat Uncle  (Not Specified)        His family history includes Asthma in his mother; CAD in his father; Diabetes in his maternal aunt and maternal uncle; Healthy in his brother, daughter, daughter, and sister; Heart disease in his father and mother.     Allergies  Allergen Reactions  . Lovastatin     Sleep disturbance and extremity pains     Current Outpatient Medications:  .  aspirin 81 MG tablet,  Take 81 mg by mouth daily., Disp: , Rfl:  .  cyanocobalamin 1000 MCG tablet, Take 1,000 mcg by mouth daily., Disp: , Rfl:  .  ezetimibe (ZETIA) 10 MG tablet, Take 1 tablet (10 mg total) by mouth daily., Disp: 30 tablet, Rfl: 3 .  fish oil-omega-3 fatty acids 1000 MG capsule, Take 2 g by mouth daily., Disp: , Rfl:  .  LYRICA 100 MG capsule, TAKE (1) CAPSULE BY MOUTH THREE TIMES DAILY, Disp: 90 capsule, Rfl: 3 .  omeprazole (PRILOSEC) 40 MG capsule, TAKE (1) CAPSULE BY MOUTH ONCE DAILY., Disp: 30 capsule, Rfl: 3 .  zolpidem (AMBIEN CR) 12.5 MG CR tablet, TAKE 1 TABLET BY MOUTH AT BEDTIME AS NEEDED., Disp: 30 tablet, Rfl: 2   Patient Care Team: Chrismon, Vickki Muff, PA as PCP - General (Family Medicine) Bary Castilla,  Forest Gleason, MD as Consulting Physician (General Surgery)      Objective:   Vitals: BP 122/80 (BP Location: Right Arm, Patient Position: Sitting, Cuff Size: Normal)   Pulse (!) 55   Temp 97.7 F (36.5 C) (Oral)   Resp 16   Ht 5' 10.5" (1.791 m)   Wt 179 lb 3.2 oz (81.3 kg)   SpO2 99%   BMI 25.35 kg/m    Physical Exam  Constitutional: He is oriented to person, place, and time. He appears well-developed and well-nourished.  HENT:  Head: Normocephalic and atraumatic.  Right Ear: External ear normal.  Left Ear: External ear normal.  Nose: Nose normal.  Mouth/Throat: Oropharynx is clear and moist.  Eyes: Conjunctivae and EOM are normal. Pupils are equal, round, and reactive to light. Right eye exhibits no discharge.  Neck: Normal range of motion. Neck supple. No tracheal deviation present. No thyromegaly present.  Cardiovascular: Normal rate, regular rhythm, normal heart sounds and intact distal pulses.  No murmur heard. Pulmonary/Chest: Effort normal and breath sounds normal. No respiratory distress. He has no wheezes. He has no rales. He exhibits no tenderness.  Abdominal: Soft. He exhibits no distension and no mass. There is no tenderness. There is no rebound and no guarding.  Genitourinary: Rectum normal, prostate normal and penis normal. Rectal exam shows guaiac negative stool.  Musculoskeletal: Normal range of motion. He exhibits no edema or tenderness.  Lymphadenopathy:    He has no cervical adenopathy.  Neurological: He is alert and oriented to person, place, and time. He has normal reflexes. No cranial nerve deficit. He exhibits normal muscle tone. Coordination normal.  Skin: Skin is warm and dry. No rash noted. No erythema.  Psychiatric: He has a normal mood and affect. His behavior is normal. Judgment and thought content normal.    Depression Screen PHQ 2/9 Scores 02/18/2017 02/14/2016  PHQ - 2 Score 0 0  PHQ- 9 Score 1 -      Assessment & Plan:     Routine  Health Maintenance and Physical Exam  Exercise Activities and Dietary recommendations Goals    Continues very physical job every day.      Immunization History  Administered Date(s) Administered  . Influenza,inj,Quad PF,6+ Mos 12/13/2015, 11/26/2016  . Tdap 05/26/2006    Health Maintenance  Topic Date Due  . TETANUS/TDAP  05/25/2016  . COLONOSCOPY  01/19/2024  . INFLUENZA VACCINE  Completed  . Hepatitis C Screening  Completed  . HIV Screening  Completed     Discussed health benefits of physical activity, and encouraged him to engage in regular exercise appropriate for his age and condition.    --------------------------------------------------------------------  1. Annual physical exam General health in good shape. Chronic insomnia controlled by Ambien. Wakes frequently without restful sleep if he does not take the Ambien. Tried to go without it some weekends. No daytime hangover effect. Will up date Td today and schedule screening colonoscopy per Dr. Candace Cruise (gastroenterologist). Given anticipatory guidance. - Lipid panel - COMPLETE METABOLIC PANEL WITH GFR - CBC with Differential/Platelet  2. Peripheral polyneuropathy Unchanged pins and needles/intermittent sharp pains in feet (L>R). Continue to take the Lyrica with no worsening. Uses Ambien to help with sleep without daytime hangover.  3. Anal fissure Intermittent irritation without recent bleeding. Check CBC for any signs of blood loss. Hemoccult slide negative today. - CBC with Differential/Platelet  4. Hypercholesterolemia Still taking the Zetia and Omega-3 Fish Oil daily. Continue low fat diet and recheck labs. - Lipid panel - COMPLETE METABOLIC PANEL WITH GFR  5. Need for Td vaccine - Td : Tetanus/diphtheria >7yo Preservative  free  6. Encounter for screening colonoscopy Contacted Dr. Candace Cruise (gastroenterologist that did his colonoscopy in 2013 and he states patient needs his 5 year re-screening colonoscopy. Will  schedule. (Dr. Myrna Blazer office will send a copy of the 2013 report.) - Ambulatory referral to Gastroenterology    Vernie Murders, [facility]

## 2017-02-18 NOTE — Progress Notes (Signed)
Abstracted Colonoscopy- Dr. Candace Cruise on 11/10/2011 recommended to repeat in 5 years.

## 2017-02-21 ENCOUNTER — Other Ambulatory Visit: Payer: Self-pay | Admitting: Family Medicine

## 2017-02-23 ENCOUNTER — Encounter: Payer: Self-pay | Admitting: Family Medicine

## 2017-03-05 ENCOUNTER — Other Ambulatory Visit: Payer: Self-pay | Admitting: Family Medicine

## 2017-03-05 ENCOUNTER — Telehealth: Payer: Self-pay | Admitting: Family Medicine

## 2017-03-05 DIAGNOSIS — G629 Polyneuropathy, unspecified: Secondary | ICD-10-CM

## 2017-03-05 MED ORDER — PREGABALIN 100 MG PO CAPS: ORAL_CAPSULE | ORAL | 3 refills | Status: DC

## 2017-03-05 NOTE — Progress Notes (Signed)
Insurance requests a new Lyrica prescription due to insurance changes. Printed for patient to pick up.

## 2017-03-05 NOTE — Telephone Encounter (Signed)
Can't be sent electronically. Written prescription at the front desk for pick up.

## 2017-03-05 NOTE — Telephone Encounter (Signed)
RX called in at [facility]. Patient advised.

## 2017-03-05 NOTE — Telephone Encounter (Signed)
Patient was told he should have refills on this RX but per the pharmacy, a new law has come into effect and his prescription for Lyrica has to be renewed every 6 mths.

## 2017-03-05 NOTE — Telephone Encounter (Signed)
Patient needs Lyrica refills

## 2017-03-10 ENCOUNTER — Other Ambulatory Visit: Payer: Self-pay

## 2017-03-10 ENCOUNTER — Telehealth: Payer: Self-pay | Admitting: Gastroenterology

## 2017-03-10 DIAGNOSIS — Z1211 Encounter for screening for malignant neoplasm of colon: Secondary | ICD-10-CM

## 2017-03-10 MED ORDER — NA SULFATE-K SULFATE-MG SULF 17.5-3.13-1.6 GM/177ML PO SOLN: 1.0000 | Freq: Once | ORAL | 0 refills | Status: AC

## 2017-03-10 NOTE — Telephone Encounter (Signed)
Gastroenterology Pre-Procedure Review  Request Date: 03/19/17 Requesting Physician: Dr. Bonna Gains  PATIENT REVIEW QUESTIONS: The patient responded to the following health history questions as indicated:    1. Are you having any GI issues? no 2. Do you have a personal history of Polyps? no 3. Do you have a family history of Colon Cancer or Polyps? yes (dad polyps) 4. Diabetes Mellitus? no 5. Joint replacements in the past 12 months?no 6. Major health problems in the past 3 months?no 7. Any artificial heart valves, MVP, or defibrillator?no    MEDICATIONS & ALLERGIES:    Patient reports the following regarding taking any anticoagulation/antiplatelet therapy:   Plavix, Coumadin, Eliquis, Xarelto, Lovenox, Pradaxa, Brilinta, or Effient? no Aspirin? no  Patient confirms/reports the following medications:  Current Outpatient Medications  Medication Sig Dispense Refill  . aspirin 81 MG tablet Take 81 mg by mouth daily.    . cyanocobalamin 1000 MCG tablet Take 1,000 mcg by mouth daily.    Marland Kitchen ezetimibe (ZETIA) 10 MG tablet TAKE 1 TABLET BY MOUTH ONCE DAILY. 90 tablet 3  . fish oil-omega-3 fatty acids 1000 MG capsule Take 2 g by mouth daily.    Marland Kitchen omeprazole (PRILOSEC) 40 MG capsule TAKE (1) CAPSULE BY MOUTH ONCE DAILY. 30 capsule 3  . pregabalin (LYRICA) 100 MG capsule TAKE (1) CAPSULE BY MOUTH THREE TIMES DAILY 90 capsule 3  . zolpidem (AMBIEN CR) 12.5 MG CR tablet TAKE 1 TABLET BY MOUTH AT BEDTIME AS NEEDED. 30 tablet 2   No current facility-administered medications for this visit.     Patient confirms/reports the following allergies:  Allergies  Allergen Reactions  . Lovastatin     Sleep disturbance and extremity pains    No orders of the defined types were placed in this encounter.   AUTHORIZATION INFORMATION Primary Insurance: 1D#: Group #:  Secondary Insurance: 1D#: Group #:  SCHEDULE INFORMATION: Date: 03/19/17 Time: Location:[facility]

## 2017-03-10 NOTE — Telephone Encounter (Signed)
Patient called back to schedule a colonoscopy.

## 2017-03-19 ENCOUNTER — Other Ambulatory Visit: Payer: Self-pay

## 2017-03-19 ENCOUNTER — Ambulatory Visit: Payer: BLUE CROSS/BLUE SHIELD | Admitting: Anesthesiology

## 2017-03-19 ENCOUNTER — Encounter
Admission: RE | Disposition: A | Discharge status: Home / Self Care | Payer: Self-pay | Source: Ambulatory Visit | Attending: Gastroenterology

## 2017-03-19 ENCOUNTER — Encounter: Payer: Self-pay | Admitting: *Deleted

## 2017-03-19 ENCOUNTER — Ambulatory Visit
Admission: RE | Admit: 2017-03-19 | Discharge: 2017-03-19 | Disposition: A | Discharge status: Home / Self Care | Payer: BLUE CROSS/BLUE SHIELD | Source: Ambulatory Visit | Attending: Gastroenterology | Admitting: Gastroenterology

## 2017-03-19 DIAGNOSIS — Z860100 Personal history of colon polyps, unspecified: Secondary | ICD-10-CM

## 2017-03-19 DIAGNOSIS — E785 Hyperlipidemia, unspecified: Secondary | ICD-10-CM | POA: Insufficient documentation

## 2017-03-19 DIAGNOSIS — K635 Polyp of colon: Secondary | ICD-10-CM | POA: Diagnosis not present

## 2017-03-19 DIAGNOSIS — D123 Benign neoplasm of transverse colon: Secondary | ICD-10-CM | POA: Insufficient documentation

## 2017-03-19 DIAGNOSIS — D122 Benign neoplasm of ascending colon: Secondary | ICD-10-CM

## 2017-03-19 DIAGNOSIS — Z8249 Family history of ischemic heart disease and other diseases of the circulatory system: Secondary | ICD-10-CM | POA: Insufficient documentation

## 2017-03-19 DIAGNOSIS — Z1211 Encounter for screening for malignant neoplasm of colon: Secondary | ICD-10-CM | POA: Diagnosis not present

## 2017-03-19 DIAGNOSIS — K5730 Diverticulosis of large intestine without perforation or abscess without bleeding: Secondary | ICD-10-CM | POA: Diagnosis not present

## 2017-03-19 DIAGNOSIS — K219 Gastro-esophageal reflux disease without esophagitis: Secondary | ICD-10-CM | POA: Insufficient documentation

## 2017-03-19 DIAGNOSIS — Z7982 Long term (current) use of aspirin: Secondary | ICD-10-CM | POA: Diagnosis not present

## 2017-03-19 DIAGNOSIS — Z79899 Other long term (current) drug therapy: Secondary | ICD-10-CM | POA: Diagnosis not present

## 2017-03-19 DIAGNOSIS — D126 Benign neoplasm of colon, unspecified: Secondary | ICD-10-CM | POA: Diagnosis not present

## 2017-03-19 HISTORY — DX: Gastro-esophageal reflux disease without esophagitis: K21.9

## 2017-03-19 HISTORY — PX: COLONOSCOPY WITH PROPOFOL: SHX5780

## 2017-03-19 SURGERY — COLONOSCOPY WITH PROPOFOL
Anesthesia: General

## 2017-03-19 MED ORDER — SODIUM CHLORIDE 0.9 % IV SOLN
INTRAVENOUS | Status: DC
  Administered 2017-03-19: 14:00:00 via INTRAVENOUS

## 2017-03-19 MED ORDER — ONDANSETRON HCL 4 MG/2ML IJ SOLN
INTRAMUSCULAR | Status: AC
  Filled 2017-03-19: qty 2

## 2017-03-19 MED ORDER — PROPOFOL 500 MG/50ML IV EMUL
INTRAVENOUS | Status: AC
  Filled 2017-03-19: qty 50

## 2017-03-19 MED ORDER — PROPOFOL 500 MG/50ML IV EMUL
INTRAVENOUS | Status: DC | PRN
  Administered 2017-03-19: 150 ug/kg/min via INTRAVENOUS

## 2017-03-19 MED ORDER — PROPOFOL 10 MG/ML IV BOLUS
INTRAVENOUS | Status: DC | PRN
  Administered 2017-03-19: 70 mg via INTRAVENOUS

## 2017-03-19 NOTE — Anesthesia Procedure Notes (Signed)
Date/Time: 03/19/2017 3:57 PM Performed by: Nelda Marseille, CRNA Pre-anesthesia Checklist: Patient identified, Emergency Drugs available, Suction available, Patient being monitored and Timeout performed Oxygen Delivery Method: Nasal cannula

## 2017-03-19 NOTE — Transfer of Care (Signed)
Immediate Anesthesia Transfer of Care Note  Patient: Dale Gray  Procedure(s) Performed: COLONOSCOPY WITH PROPOFOL (N/A )  Patient Location: PACU  Anesthesia Type:General  Level of Consciousness: sedated  Airway & Oxygen Therapy: Patient Spontanous Breathing and Patient connected to nasal cannula oxygen  Post-op Assessment: Report given to RN and Post -op Vital signs reviewed and stable  Post vital signs: Reviewed and stable  Last Vitals:  Vitals:   03/19/17 1314 03/19/17 1631  BP: 121/77 (!) 91/55  Pulse: (!) 51 (!) 50  Resp: 16 15  Temp: (!) 35.8 C (!) 35.9 C  SpO2: 100% 98%    Last Pain:  Vitals:   03/19/17 1631  TempSrc: Tympanic         Complications: No apparent anesthesia complications

## 2017-03-19 NOTE — Anesthesia Preprocedure Evaluation (Signed)
Anesthesia Evaluation  Patient identified by MRN, date of birth, ID band Patient awake    Reviewed: Allergy & Precautions, NPO status , Patient's Chart, lab work & pertinent test results, reviewed documented beta blocker date and time   Airway Mallampati: II  TM Distance: >3 FB     Dental  (+) Chipped   Pulmonary           Cardiovascular      Neuro/Psych  Neuromuscular disease    GI/Hepatic GERD  ,  Endo/Other    Renal/GU      Musculoskeletal   Abdominal   Peds  Hematology  (+) anemia ,   Anesthesia Other Findings   Reproductive/Obstetrics                             Anesthesia Physical Anesthesia Plan  ASA: III  Anesthesia Plan: General   Post-op Pain Management:    Induction: Intravenous  PONV Risk Score and Plan:   Airway Management Planned:   Additional Equipment:   Intra-op Plan:   Post-operative Plan:   Informed Consent: I have reviewed the patients History and Physical, chart, labs and discussed the procedure including the risks, benefits and alternatives for the proposed anesthesia with the patient or authorized representative who has indicated his/her understanding and acceptance.     Plan Discussed with: CRNA  Anesthesia Plan Comments:         Anesthesia Quick Evaluation

## 2017-03-19 NOTE — Op Note (Addendum)
[facility] Gastroenterology Patient Name: Dale Gray Procedure Date: 03/19/2017 3:45 PM MRN: [id number] Account #: [id number] Date of Birth: 1956-01-11 Admit Type: Outpatient Age: 62 Room: [facility] ENDO ROOM 2 Gender: Male Note Status: Finalized Procedure:            Colonoscopy Indications:          Screening for colorectal malignant neoplasm, High risk                        colon cancer surveillance: Personal history of colonic                        polyps Providers:            Zakeria Kulzer B. Maximino Greenland MD, MD Referring MD:         Jodell Cipro. Chrismon, MD (Referring MD) Medicines:            Monitored Anesthesia Care Complications:        No immediate complications. Procedure:            Pre-Anesthesia Assessment:                       - ASA Grade Assessment: III - A patient with severe                        systemic disease.                       - Prior to the procedure, a History and Physical was                        performed, and patient medications, allergies and                        sensitivities were reviewed. The patient's tolerance of                        previous anesthesia was reviewed.                       - The risks and benefits of the procedure and the                        sedation options and risks were discussed with the                        patient. All questions were answered and informed                        consent was obtained.                       - Patient identification and proposed procedure were                        verified prior to the procedure by the physician, the                        nurse, the anesthesiologist, the anesthetist and the  technician. The procedure was verified in the procedure                        room.                       After obtaining informed consent, the colonoscope was                        passed under direct vision. Throughout the procedure,       the patient's blood pressure, pulse, and oxygen                        saturations were monitored continuously. The                        Colonoscope was introduced through the anus and                        advanced to the the cecum, identified by appendiceal                        orifice and ileocecal valve. The colonoscopy was                        performed with ease. The patient tolerated the                        procedure well. The quality of the bowel preparation                        was fair and was cleaned with water and suctioning and                        made into a good prep. Findings:      The perianal and digital rectal examinations were normal.      Two sessile polyps were found in the ascending colon. The polyps were 2       to 3 mm in size. These polyps were removed with a cold biopsy forceps.       Resection and retrieval were complete.      Multiple small-mouthed diverticula were found in the sigmoid colon.      The exam was otherwise without abnormality.      The rectum, sigmoid colon, descending colon, transverse colon, ascending       colon and cecum appeared normal.      The retroflexed view of the distal rectum and anal verge was normal and       showed no anal or rectal abnormalities. Impression:           - Preparation of the colon was fair.                       - Two 2 to 3 mm polyps in the ascending colon, removed                        with a cold biopsy forceps. Resected and retrieved.                       - Diverticulosis in the sigmoid colon.                       -  The examination was otherwise normal.                       - The rectum, sigmoid colon, descending colon,                        transverse colon, ascending colon and cecum are normal.                       - The distal rectum and anal verge are normal on                        retroflexion view. Recommendation:       - Discharge patient to home (with escort).                        - Advance diet as tolerated.                       - Continue present medications.                       - Await pathology results.                       - Repeat colonoscopy in 5 years for surveillance due to                        fair prep on this exam and history of polyps.                       - The findings and recommendations were discussed with                        the patient.                       - The findings and recommendations were discussed with                        the patient's family.                       - Return to primary care physician as previously                        scheduled.                       - High fiber diet. Procedure Code(s):    --- Professional ---                       [phone], Colonoscopy, flexible; with biopsy, single or                        multiple Diagnosis Code(s):    --- Professional ---                       Z12.11, Encounter for screening for malignant neoplasm                        of colon  Z86.010, Personal history of colonic polyps                       D12.2, Benign neoplasm of ascending colon                       K57.30, Diverticulosis of large intestine without                        perforation or abscess without bleeding CPT copyright 2016 American Medical Association. All rights reserved. The codes documented in this report are preliminary and upon coder review may  be revised to meet current compliance requirements.  Melodie Bouillon, MD Michel Bickers B. Maximino Greenland MD, MD 03/19/2017 4:28:29 PM This report has been signed electronically. Number of Addenda: 0 Note Initiated On: 03/19/2017 3:45 PM Scope Withdrawal Time: 0 hours 16 minutes 8 seconds  Total Procedure Duration: 0 hours 25 minutes 45 seconds  Estimated Blood Loss: Estimated blood loss: none.      [facility]

## 2017-03-19 NOTE — Anesthesia Post-op Follow-up Note (Signed)
Anesthesia QCDR form completed.        

## 2017-03-19 NOTE — H&P (Signed)
Vonda Antigua, MD [address], [place], [place], [place], [address] [address], [place], [place], [place], [address] Phone: [phone]  Fax: [phone]  Primary Care Physician:  Margo Common, PA   Pre-Procedure History & Physical: HPI:  Dale Gray is a 62 y.o. male is here for an colonoscopy.   Past Medical History:  Diagnosis Date  . GERD (gastroesophageal reflux disease)   . Hemorrhoid   . Hyperlipidemia   . Joint pain   . Reflux     Past Surgical History:  Procedure Laterality Date  . ANAL FISSURE REPAIR    . TYMPANOPLASTY Left 04/30/2014  . VASECTOMY  01/06/2002    Prior to Admission medications   Medication Sig Start Date End Date Taking? Authorizing Provider  aspirin 81 MG tablet Take 81 mg by mouth daily.   Yes [provider]  cyanocobalamin 1000 MCG tablet Take 1,000 mcg by mouth daily.   Yes [provider]  ezetimibe (ZETIA) 10 MG tablet TAKE 1 TABLET BY MOUTH ONCE DAILY. 02/22/17  Yes Chrismon, Vickki Muff, PA  fish oil-omega-3 fatty acids 1000 MG capsule Take 2 g by mouth daily.   Yes [provider]  omeprazole (PRILOSEC) 40 MG capsule TAKE (1) CAPSULE BY MOUTH ONCE DAILY. 12/08/16  Yes Chrismon, Vickki Muff, PA  pregabalin (LYRICA) 100 MG capsule TAKE (1) CAPSULE BY MOUTH THREE TIMES DAILY 03/05/17  Yes Chrismon, Vickki Muff, PA  zolpidem (AMBIEN CR) 12.5 MG CR tablet TAKE 1 TABLET BY MOUTH AT BEDTIME AS NEEDED. 01/07/17  Yes Chrismon, Vickki Muff, PA    Allergies as of 03/11/2017 - Review Complete 02/18/2017  Allergen Reaction Noted  . Lovastatin  01/22/2015    Family History  Problem Relation Age of Onset  . Heart disease Mother   . Asthma Mother   . Heart disease Father   . CAD Father   . Healthy Sister   . Healthy Brother   . Healthy Daughter   . Healthy Daughter   . Diabetes Maternal Aunt   . Diabetes Maternal Uncle     Social History   Socioeconomic History  . Marital status: Married    Spouse  name: Not on file  . Number of children: Not on file  . Years of education: Not on file  . Highest education level: Not on file  Social Needs  . Financial resource strain: Not on file  . Food insecurity - worry: Not on file  . Food insecurity - inability: Not on file  . Transportation needs - medical: Not on file  . Transportation needs - non-medical: Not on file  Occupational History  . Not on file  Tobacco Use  . Smoking status: Never Smoker  . Smokeless tobacco: Never Used  Substance and Sexual Activity  . Alcohol use: No  . Drug use: No  . Sexual activity: Not on file  Other Topics Concern  . Not on file  Social History Narrative  . Not on file    Review of Systems: See HPI, otherwise negative ROS  Physical Exam: BP 121/77   Pulse (!) 51   Temp (!) 96.5 F (35.8 C) (Tympanic)   Resp 16   Ht 5\' 10"  (1.778 m)   Wt 175 lb (79.4 kg)   SpO2 100%   BMI 25.11 kg/m  General:   Alert,  pleasant and cooperative in NAD Head:  Normocephalic and atraumatic. Neck:  Supple; no masses or thyromegaly. Lungs:  Clear throughout to auscultation, normal  respiratory effort.    Heart:  +S1, +S2, Regular rate and rhythm, No edema. Abdomen:  Soft, nontender and nondistended. Normal bowel sounds, without guarding, and without rebound.   Neurologic:  Alert and  oriented x4;  grossly normal neurologically.  Impression/Plan: Dale Gray is here for an colonoscopy to be performed for surveillance due to prior history of colon polyps   Risks, benefits, limitations, and alternatives regarding  colonoscopy have been reviewed with the patient.  Questions have been answered.  All parties agreeable.   Virgel Manifold, MD  03/19/2017, 3:48 PM

## 2017-03-21 NOTE — Anesthesia Postprocedure Evaluation (Signed)
Anesthesia Post Note  Patient: Dale Gray  Procedure(s) Performed: COLONOSCOPY WITH PROPOFOL (N/A )  Anesthesia Type: General     Last Vitals:  Vitals:   03/19/17 1651 03/19/17 1701  BP: 100/62 132/78  Pulse: (!) 44 (!) 45  Resp: 13 10  Temp:    SpO2: 100% 100%    Last Pain:  Vitals:   03/19/17 1631  TempSrc: Tympanic                 Molli Barrows

## 2017-03-22 ENCOUNTER — Encounter: Payer: Self-pay | Admitting: Gastroenterology

## 2017-03-23 LAB — SURGICAL PATHOLOGY

## 2017-03-30 ENCOUNTER — Encounter: Payer: Self-pay | Admitting: Gastroenterology

## 2017-04-07 ENCOUNTER — Other Ambulatory Visit: Payer: Self-pay | Admitting: Family Medicine

## 2017-04-07 NOTE — Telephone Encounter (Signed)
Patient needs refills on Omeprazole and Ambien called into [facility].  Patient is out.

## 2017-04-08 NOTE — Telephone Encounter (Signed)
Please phone in Zolpidem refills.

## 2017-04-08 NOTE — Telephone Encounter (Signed)
RX called in at [facility]

## 2017-04-15 ENCOUNTER — Encounter: Payer: Self-pay | Admitting: Family Medicine

## 2017-04-15 ENCOUNTER — Ambulatory Visit: Payer: BLUE CROSS/BLUE SHIELD | Admitting: Family Medicine

## 2017-04-15 VITALS — BP 110/72 | HR 55 | Temp 97.6°F | Wt 177.0 lb

## 2017-04-15 DIAGNOSIS — R202 Paresthesia of skin: Secondary | ICD-10-CM | POA: Diagnosis not present

## 2017-04-15 MED ORDER — MELOXICAM 15 MG PO TABS: 15.0000 mg | ORAL_TABLET | Freq: Every day | ORAL | 0 refills | Status: DC

## 2017-04-15 NOTE — Progress Notes (Signed)
Patient: Dale Gray Male    DOB: Apr 14, 1955   62 y.o.   MRN: [id number] Visit Date: 04/15/2017  Today's Provider: Vernie Murders, PA   Chief Complaint  Patient presents with  . Hand Pain   Subjective:    Hand Pain   Incident onset: 2 weeks. The injury mechanism is unknown. The pain is present in the right hand. Radiates to: right pinky finger and right elbow. Associated symptoms include numbness and tingling. The symptoms are aggravated by movement. He has tried nothing for the symptoms.   Past Medical History:  Diagnosis Date  . GERD (gastroesophageal reflux disease)   . Hemorrhoid   . Hyperlipidemia   . Joint pain   . Reflux    Patient Active Problem List   Diagnosis Date Noted  . Special screening for malignant neoplasms, colon   . Personal history of colonic polyps   . Benign neoplasm of ascending colon   . Peripheral neuropathy 01/24/2015  . Anal fissure 01/22/2015  . Disorder of coccyx 01/22/2015  . History of digestive disease 01/22/2015  . HLD (hyperlipidemia) 01/22/2015  . Anemia, iron deficiency 01/22/2015  . Yeast dermatitis 06/22/2012  . Hemorrhoid   . Fam hx-ischem heart disease 07/20/2007  . Acid reflux 05/26/2006  . Cannot sleep 10/12/2005  . Hypercholesterolemia without hypertriglyceridemia 10/12/2005   Past Surgical History:  Procedure Laterality Date  . ANAL FISSURE REPAIR    . COLONOSCOPY WITH PROPOFOL N/A 03/19/2017   Procedure: COLONOSCOPY WITH PROPOFOL;  Surgeon: Virgel Manifold, MD;  Location: [facility] ENDOSCOPY;  Service: Endoscopy;  Laterality: N/A;  . TYMPANOPLASTY Left 04/30/2014  . VASECTOMY  01/06/2002   Family History  Problem Relation Age of Onset  . Heart disease Mother   . Asthma Mother   . Heart disease Father   . CAD Father   . Healthy Sister   . Healthy Brother   . Healthy Daughter   . Healthy Daughter   . Diabetes Maternal Aunt   . Diabetes Maternal Uncle    Allergies  Allergen Reactions  . Lovastatin       Sleep disturbance and extremity pains    Current Outpatient Medications:  .  aspirin 81 MG tablet, Take 81 mg by mouth daily., Disp: , Rfl:  .  cyanocobalamin 1000 MCG tablet, Take 1,000 mcg by mouth daily., Disp: , Rfl:  .  ezetimibe (ZETIA) 10 MG tablet, TAKE 1 TABLET BY MOUTH ONCE DAILY., Disp: 90 tablet, Rfl: 3 .  fish oil-omega-3 fatty acids 1000 MG capsule, Take 2 g by mouth daily., Disp: , Rfl:  .  omeprazole (PRILOSEC) 40 MG capsule, TAKE (1) CAPSULE BY MOUTH ONCE DAILY., Disp: 30 capsule, Rfl: 3 .  pregabalin (LYRICA) 100 MG capsule, TAKE (1) CAPSULE BY MOUTH THREE TIMES DAILY, Disp: 90 capsule, Rfl: 3 .  zolpidem (AMBIEN CR) 12.5 MG CR tablet, TAKE 1 TABLET BY MOUTH AT BEDTIME AS NEEDED., Disp: 30 tablet, Rfl: 1  Review of Systems  Constitutional: Negative.   Respiratory: Negative.   Cardiovascular: Negative.   Neurological: Positive for tingling and numbness.    Social History   Tobacco Use  . Smoking status: Never Smoker  . Smokeless tobacco: Never Used  Substance Use Topics  . Alcohol use: No   Objective:   BP 110/72 (BP Location: Right Arm, Patient Position: Sitting, Cuff Size: Normal)   Pulse (!) 55   Temp 97.6 F (36.4 C) (Oral)   Wt 177 lb (80.3 kg)  SpO2 98%   BMI 25.40 kg/m   Physical Exam  Constitutional: He is oriented to person, place, and time. He appears well-developed and well-nourished. No distress.  HENT:  Head: Normocephalic and atraumatic.  Right Ear: Hearing normal.  Left Ear: Hearing normal.  Nose: Nose normal.  Eyes: Conjunctivae and lids are normal. Right eye exhibits no discharge. Left eye exhibits no discharge. No scleral icterus.  Pulmonary/Chest: Effort normal. No respiratory distress.  Musculoskeletal: Normal range of motion.  Neurological: He is alert and oriented to person, place, and time.  Numbness sensation in the right 4th and 5th fingers radiating up to the notch between medial epicondyle and olecranon process at the  elbow. Normal grip strength. Negative Phalen and Tinel signs at the wrists.  Skin: Skin is intact. No lesion and no rash noted.  Psychiatric: He has a normal mood and affect. His speech is normal and behavior is normal. Thought content normal.      Assessment & Plan:     1. Paresthesias in right hand Onset 2 weeks ago while working on a fence wire at home. Decreased sensation but good grip strength. No muscle wasting. Suspect ulnar neuropathy. Will treat with NSAID. If no better in 10-14 days, will need nerve conduction study and possible orthopedic referral. - meloxicam (MOBIC) 15 MG tablet; Take 1 tablet (15 mg total) by mouth daily.  Dispense: 30 tablet; Refill: [place], [place]  [facility]

## 2017-04-15 NOTE — Patient Instructions (Signed)
Ulnar Nerve Contusion Rehab Ask your health care provider which exercises are safe for you. Do exercises exactly as told by your health care provider and adjust them as directed. It is normal to feel mild stretching, pulling, tightness, or discomfort as you do these exercises, but you should stop right away if you feel sudden pain or your pain gets worse. Do not begin these exercises until told by your health care provider. Stretching and range of motion exercises These exercises warm up your muscles and joints and improve the movement and flexibility of your forearm. These exercises also help to relieve pain, numbness, and tingling. Exercise A: Extensor stretch 1. Stand over a tabletop with your __right________ hand resting palm-up on the tabletop and your fingers pointing away from your body. Your arm should be extended and there should be a slight bend in your elbow. 2. Gently press the back of your hand down onto the table by straightening your elbow. You should feel a stretch on the top of your forearm. 3. Hold this position for ____5______ seconds. 4. Slowly return to the starting position. Repeat _____5_____ times. Complete this stretch ____2______ times a day. Exercise B: Flexor stretch  1. Stand over a tabletop with your _____right_____ hand resting palm-down on the tabletop and your fingers pointing away from your body. Your arm should be extended and there should be a slight bend in your elbow. 2. Gently press your fingers and palm down onto the table by straightening your elbow. You should feel a stretch on the inside of your forearm. 3. Hold this position for ____5______ seconds. 4. Slowly return to the starting position. Repeat _______5___ times. Complete this stretch _____2_____ times a day. Exercise C: Ulnar nerve glide, hand moving 1. Stand or sit with your left / right elbow bent and your hand at the height of your shoulder. 2. Move your elbow about 6 inches (15 cm) away from  your body. 3. Gently and slowly wave your hand back and forth. 4. Repeat this motion for ___5_______ seconds. Repeat _____5_____ times. Complete this exercise ____2______ times a day. Exercise D: Ulnar nerve glide, elbow moving 1. Stand or sit with your left / right arm straight out to your side at shoulder height. 2. Bring your fingers up toward the ceiling so your palm faces away from you. 3. Bend your elbow and bring it to your side and bend your wrist so your palm now faces the floor. 4. Slowly go back and forth between doing the step 2 position and the step 3 position. 5. Repeat these motions for ___5_______ seconds. Repeat _____5_____ times. Complete this exercise ____2______ times a day. Strengthening exercises These exercises build strength and endurance in your forearm. Endurance is the ability to use your muscles for a long time, even after they get tired. Exercise E: Grip  1. Hold one of these items in your ___right_______ hand: a dense sponge, a tennis ball, or a large, rolled sock. 2. Slowly squeeze as hard as you can without increasing any pain. 3. Hold this position for ____5______ seconds. 4. Slowly release your grip. Repeat ____5______ times. Complete this exercise ___2_______ times a day. This information is not intended to replace advice given to you by your health care provider. Make sure you discuss any questions you have with your health care provider. Document Released: 02/23/2005 Document Revised: 10/31/2015 Document Reviewed: 01/03/2015 Elsevier Interactive Patient Education  [employer].

## 2017-05-26 ENCOUNTER — Other Ambulatory Visit: Payer: Self-pay | Admitting: Family Medicine

## 2017-06-07 ENCOUNTER — Other Ambulatory Visit: Payer: Self-pay | Admitting: Family Medicine

## 2017-06-07 ENCOUNTER — Telehealth: Payer: Self-pay | Admitting: Family Medicine

## 2017-06-07 NOTE — Telephone Encounter (Signed)
Done

## 2017-06-07 NOTE — Telephone Encounter (Signed)
Pt is using his last Ambien pill today.  Will you please send this in to [facility].

## 2017-07-07 ENCOUNTER — Other Ambulatory Visit: Payer: Self-pay | Admitting: Family Medicine

## 2017-07-07 NOTE — Telephone Encounter (Signed)
Patient needs refill on Ambien CR 12.5 mg.  Called to [facility]

## 2017-08-04 ENCOUNTER — Other Ambulatory Visit: Payer: Self-pay | Admitting: Family Medicine

## 2017-08-04 NOTE — Telephone Encounter (Signed)
Patient needs refills on Ambien CR please to [facility]

## 2017-08-05 ENCOUNTER — Other Ambulatory Visit: Payer: Self-pay | Admitting: Family Medicine

## 2017-09-15 ENCOUNTER — Encounter: Payer: Self-pay | Admitting: Physician Assistant

## 2017-09-15 ENCOUNTER — Ambulatory Visit: Payer: BLUE CROSS/BLUE SHIELD | Admitting: Physician Assistant

## 2017-09-15 VITALS — BP 128/82 | HR 74 | Temp 97.6°F | Wt 169.8 lb

## 2017-09-15 DIAGNOSIS — M62838 Other muscle spasm: Secondary | ICD-10-CM

## 2017-09-15 MED ORDER — METHYLPREDNISOLONE 4 MG PO TBPK: ORAL_TABLET | ORAL | 0 refills | Status: DC

## 2017-09-15 MED ORDER — CYCLOBENZAPRINE HCL 5 MG PO TABS: 5.0000 mg | ORAL_TABLET | Freq: Three times a day (TID) | ORAL | 0 refills | Status: DC | PRN

## 2017-09-15 NOTE — Patient Instructions (Signed)

## 2017-09-15 NOTE — Progress Notes (Signed)
Patient: Dale Gray Male    DOB: Dec 09, 1955   62 y.o.   MRN: [id number] Visit Date: 09/15/2017  Today's Provider: Mar Daring, PA-C   Chief Complaint  Patient presents with  . Arm Pain   Subjective:    Arm Pain   Incident onset: Monday. There was no injury mechanism. The pain is present in the right shoulder (under right arm). The quality of the pain is described as aching (episodes of spasms). The pain radiates to the back. The pain is severe. The pain has been constant since the incident. The symptoms are aggravated by movement. Treatments tried: Motrin. The treatment provided no relief.     Allergies  Allergen Reactions  . Lovastatin     Sleep disturbance and extremity pains     Current Outpatient Medications:  .  aspirin 81 MG tablet, Take 81 mg by mouth daily., Disp: , Rfl:  .  cyanocobalamin 1000 MCG tablet, Take 1,000 mcg by mouth daily., Disp: , Rfl:  .  ezetimibe (ZETIA) 10 MG tablet, TAKE 1 TABLET BY MOUTH ONCE DAILY., Disp: 90 tablet, Rfl: 3 .  fish oil-omega-3 fatty acids 1000 MG capsule, Take 2 g by mouth daily., Disp: , Rfl:  .  omeprazole (PRILOSEC) 40 MG capsule, TAKE (1) CAPSULE BY MOUTH ONCE DAILY., Disp: 30 capsule, Rfl: 3 .  pregabalin (LYRICA) 100 MG capsule, TAKE (1) CAPSULE BY MOUTH THREE TIMES DAILY, Disp: 90 capsule, Rfl: 3 .  zolpidem (AMBIEN CR) 12.5 MG CR tablet, TAKE 1 TABLET BY MOUTH AT BEDTIME AS NEEDED., Disp: 30 tablet, Rfl: 1 .  meloxicam (MOBIC) 15 MG tablet, Take 1 tablet (15 mg total) by mouth daily. (Patient not taking: Reported on 09/15/2017), Disp: 30 tablet, Rfl: 0   Review of Systems  Constitutional: Negative.   Respiratory: Negative.   Cardiovascular: Negative.   Musculoskeletal:       Pain under right arm, right shoulder radiating to back area     Social History   Tobacco Use  . Smoking status: Never Smoker  . Smokeless tobacco: Never Used  Substance Use Topics  . Alcohol use: No   Objective:   BP 128/82  (BP Location: Left Arm, Patient Position: Sitting, Cuff Size: Normal)   Pulse 74   Temp 97.6 F (36.4 C) (Oral)   Wt 169 lb 12.8 oz (77 kg)   SpO2 98%   BMI 24.36 kg/m    Physical Exam  Constitutional: He appears well-developed and well-nourished. No distress.  HENT:  Head: Normocephalic and atraumatic.  Neck: Normal range of motion. Neck supple.  Cardiovascular: Normal rate, regular rhythm and normal heart sounds. Exam reveals no gallop and no friction rub.  No murmur heard. Pulmonary/Chest: Effort normal and breath sounds normal. No respiratory distress. He has no wheezes. He has no rales.  Musculoskeletal:       Right shoulder: He exhibits decreased range of motion (shoulder abduction and shoulder forward flexion with reaching forward). He exhibits no tenderness, no bony tenderness, no swelling, no effusion, no crepitus, no pain, no spasm, normal pulse and normal strength.       Cervical back: Normal.       Thoracic back: He exhibits tenderness and spasm. He exhibits normal range of motion and no bony tenderness.       Back:  Skin: No rash noted. He is not diaphoretic.  Vitals reviewed.      Assessment & Plan:     1. Muscle spasm Will  treat with medrol dose pak and muscle relaxer as below. Discussed heating and massage. Advised to monitor for rash. No rash today. Call if no improvements.  - methylPREDNISolone (MEDROL) 4 MG TBPK tablet; 6 day taper; take as directed on package instructions  Dispense: 21 tablet; Refill: 0 - cyclobenzaprine (FLEXERIL) 5 MG tablet; Take 1 tablet (5 mg total) by mouth 3 (three) times daily as needed for muscle spasms.  Dispense: 30 tablet; Refill: 0       Mar Daring, PA-C  [facility]

## 2017-09-20 ENCOUNTER — Ambulatory Visit
Admission: RE | Admit: 2017-09-20 | Discharge: 2017-09-20 | Disposition: A | Discharge status: Home / Self Care | Payer: BLUE CROSS/BLUE SHIELD | Source: Ambulatory Visit | Attending: Physician Assistant | Admitting: Physician Assistant

## 2017-09-20 ENCOUNTER — Telehealth: Payer: Self-pay | Admitting: Physician Assistant

## 2017-09-20 DIAGNOSIS — M62838 Other muscle spasm: Secondary | ICD-10-CM | POA: Diagnosis not present

## 2017-09-20 DIAGNOSIS — M25511 Pain in right shoulder: Secondary | ICD-10-CM | POA: Diagnosis not present

## 2017-09-20 DIAGNOSIS — M24811 Other specific joint derangements of right shoulder, not elsewhere classified: Secondary | ICD-10-CM | POA: Diagnosis not present

## 2017-09-20 DIAGNOSIS — M19011 Primary osteoarthritis, right shoulder: Secondary | ICD-10-CM | POA: Diagnosis not present

## 2017-09-20 DIAGNOSIS — R202 Paresthesia of skin: Secondary | ICD-10-CM

## 2017-09-20 MED ORDER — MELOXICAM 15 MG PO TABS: 15.0000 mg | ORAL_TABLET | Freq: Every day | ORAL | 0 refills | Status: DC

## 2017-09-20 NOTE — Telephone Encounter (Signed)
Patient advised as directed below.  Thanks,  -Joseline 

## 2017-09-20 NOTE — Telephone Encounter (Signed)
Patient called stating he is still hurting really bad in his shoulder and under arm area. He is not having a spasms like before but is still in a lot of pain and cannot put arm in front of him to brush teeth or standing up really hurts.  Please advise.

## 2017-09-20 NOTE — Telephone Encounter (Signed)
Please Review

## 2017-09-20 NOTE — Telephone Encounter (Signed)
Xray ordered, [facility]. New Rx for meloxicam sent in for him to [facility]. Also placed referral to ortho for further evaluation

## 2017-09-21 ENCOUNTER — Telehealth: Payer: Self-pay

## 2017-09-21 NOTE — Telephone Encounter (Signed)
Patient advised as directed below.  Thanks,  -Joseline 

## 2017-09-21 NOTE — Telephone Encounter (Signed)
-----   Message from Mar Daring, [place] sent at 09/20/2017  5:31 PM EDT ----- There is some arthritic changes noted in the right acromioclavicular joint (AC joint). No other deformity or abnormality noted.

## 2017-09-23 DIAGNOSIS — M50322 Other cervical disc degeneration at C5-C6 level: Secondary | ICD-10-CM | POA: Diagnosis not present

## 2017-09-23 DIAGNOSIS — M5412 Radiculopathy, cervical region: Secondary | ICD-10-CM | POA: Diagnosis not present

## 2017-09-23 DIAGNOSIS — M5030 Other cervical disc degeneration, unspecified cervical region: Secondary | ICD-10-CM | POA: Insufficient documentation

## 2017-09-27 DIAGNOSIS — M50322 Other cervical disc degeneration at C5-C6 level: Secondary | ICD-10-CM | POA: Diagnosis not present

## 2017-09-27 DIAGNOSIS — M5412 Radiculopathy, cervical region: Secondary | ICD-10-CM | POA: Diagnosis not present

## 2017-10-04 ENCOUNTER — Other Ambulatory Visit: Payer: Self-pay | Admitting: Family Medicine

## 2017-10-04 NOTE — Telephone Encounter (Signed)
Patient needs refill on Ambien and omeprazole sent to [facility] on [address]

## 2017-10-05 DIAGNOSIS — M5412 Radiculopathy, cervical region: Secondary | ICD-10-CM | POA: Diagnosis not present

## 2017-10-05 MED ORDER — OMEPRAZOLE 40 MG PO CPDR: DELAYED_RELEASE_CAPSULE | ORAL | 1 refills | Status: DC

## 2017-10-05 MED ORDER — ZOLPIDEM TARTRATE ER 12.5 MG PO TBCR: 12.5000 mg | EXTENDED_RELEASE_TABLET | Freq: Every evening | ORAL | 1 refills | Status: DC | PRN

## 2017-10-13 ENCOUNTER — Encounter: Payer: Self-pay | Admitting: General Surgery

## 2017-10-13 ENCOUNTER — Ambulatory Visit: Payer: BLUE CROSS/BLUE SHIELD | Admitting: General Surgery

## 2017-10-13 VITALS — BP 128/68 | HR 69 | Resp 16 | Ht 70.0 in | Wt 166.0 lb

## 2017-10-13 DIAGNOSIS — K645 Perianal venous thrombosis: Secondary | ICD-10-CM

## 2017-10-13 DIAGNOSIS — M6281 Muscle weakness (generalized): Secondary | ICD-10-CM | POA: Diagnosis not present

## 2017-10-13 DIAGNOSIS — M542 Cervicalgia: Secondary | ICD-10-CM | POA: Diagnosis not present

## 2017-10-13 NOTE — Progress Notes (Signed)
Patient ID: Dale Gray, male   DOB: 1955/09/29, 62 y.o.   MRN: [id number]  Chief Complaint  Patient presents with  . Rectal Pain    HPI Dale Gray is a 62 y.o. male.  Here for evaluation of hemorrhoids. He states he has been in pain for 8 days. He does admit to recent constipation.  He has been dealing with a pinched nerve in his neckand was taking muscle relaxor, steroids and pain medication. He stats he is having constant rectal pain , no bleeding and feels like something is "sticking out". Bowels moving daily,now. He has tried witch hazel, preparation H, corn starch. He has found some relief with soaking in episome salt.  HPI  Past Medical History:  Diagnosis Date  . GERD (gastroesophageal reflux disease)   . Hemorrhoid   . Hyperlipidemia   . Joint pain   . Reflux     Past Surgical History:  Procedure Laterality Date  . ANAL FISSURE REPAIR  2014  . COLONOSCOPY WITH PROPOFOL N/A 03/19/2017   Procedure: COLONOSCOPY WITH PROPOFOL;  Surgeon: Virgel Manifold, MD;  Location: [facility];  Service: Endoscopy;  Laterality: N/A;  . TYMPANOPLASTY Left 04/30/2014  . VASECTOMY  01/06/2002    Family History  Problem Relation Age of Onset  . Heart disease Mother   . Asthma Mother   . Heart disease Father   . CAD Father   . Healthy Sister   . Healthy Brother   . Healthy Daughter   . Healthy Daughter   . Diabetes Maternal Aunt   . Diabetes Maternal Uncle   . Colon cancer Neg Hx     Social History Social History   Tobacco Use  . Smoking status: Never Smoker  . Smokeless tobacco: Never Used  Substance Use Topics  . Alcohol use: No  . Drug use: No    Allergies  Allergen Reactions  . Lovastatin     Sleep disturbance and extremity pains    Current Outpatient Medications  Medication Sig Dispense Refill  . aspirin 81 MG tablet Take 81 mg by mouth daily.    . fish oil-omega-3 fatty acids 1000 MG capsule Take 2 g by mouth daily.    Marland Kitchen omeprazole  (PRILOSEC) 40 MG capsule TAKE (1) CAPSULE BY MOUTH ONCE DAILY. 30 capsule 1  . pregabalin (LYRICA) 100 MG capsule TAKE (1) CAPSULE BY MOUTH THREE TIMES DAILY 90 capsule 3  . zolpidem (AMBIEN CR) 12.5 MG CR tablet Take 1 tablet (12.5 mg total) by mouth at bedtime as needed. 30 tablet 1   No current facility-administered medications for this visit.     Review of Systems Review of Systems  Constitutional: Negative.   Respiratory: Negative.   Cardiovascular: Negative.   Gastrointestinal: Positive for constipation and rectal pain. Negative for anal bleeding and diarrhea.    Blood pressure 128/68, pulse 69, resp. rate 16, height 5\' 10"  (1.778 m), weight 166 lb (75.3 kg), SpO2 97 %.  Physical Exam Physical Exam  Constitutional: He is oriented to person, place, and time. He appears well-developed and well-nourished.  Genitourinary: Rectum normal. Rectal exam shows no fissure and anal tone normal.    Prostate is enlarged (nontender, normal median ridge no history nocturia).  Genitourinary Comments: Thrombosed hemorrhoid   Neurological: He is alert and oriented to person, place, and time.  Skin: Skin is warm and dry.  Psychiatric: His behavior is normal.      Assessment    Thrombosed external hemorrhoid.  Plan    The patient was amenable to incision and drainage.  10 cc of 0.5% Xylocaine with 0.25% Marcaine with 1 to 200,000 units of epinephrine was utilized and well-tolerated.  Betadine solution was applied to the skin.  A radial incision was made with evacuation of the clot.  No bleeding.  Procedure was well-tolerated.  Dry gauze applied.  Postoperative wound care reviewed.  Patient is aware that he may have a small amount of bleeding for the next day or 2.  He will call if any problems arise.      HPI, Physical Exam, Assessment and Plan have been scribed under the direction and in the presence of Robert Bellow, MD. Karie Fetch, RN  I have completed the exam and  reviewed the above documentation for accuracy and completeness.  I agree with the above.  Haematologist has been used and any errors in dictation or transcription are unintentional.  Hervey Ard, M.D., F.A.C.S.  Forest Gleason Byrnett 10/13/2017, 5:49 PM

## 2017-10-13 NOTE — Patient Instructions (Signed)
The patient is aware to call back for any questions or concerns.  Hemorrhoids Hemorrhoids are swollen veins in and around the rectum or anus. There are two types of hemorrhoids:  Internal hemorrhoids. These occur in the veins that are just inside the rectum. They may poke through to the outside and become irritated and painful.  External hemorrhoids. These occur in the veins that are outside of the anus and can be felt as a painful swelling or hard lump near the anus.  Most hemorrhoids do not cause serious problems, and they can be managed with home treatments such as diet and lifestyle changes. If home treatments do not help your symptoms, procedures can be done to shrink or remove the hemorrhoids. What are the causes? This condition is caused by increased pressure in the anal area. This pressure may result from various things, including:  Constipation.  Straining to have a bowel movement.  Diarrhea.  Pregnancy.  Obesity.  Sitting for long periods of time.  Heavy lifting or other activity that causes you to strain.  Anal sex.  What are the signs or symptoms? Symptoms of this condition include:  Pain.  Anal itching or irritation.  Rectal bleeding.  Leakage of stool (feces).  Anal swelling.  One or more lumps around the anus.  How is this diagnosed? This condition can often be diagnosed through a visual exam. Other exams or tests may also be done, such as:  Examination of the rectal area with a gloved hand (digital rectal exam).  Examination of the anal canal using a small tube (anoscope).  A blood test, if you have lost a significant amount of blood.  A test to look inside the colon (sigmoidoscopy or colonoscopy).  How is this treated? This condition can usually be treated at home. However, various procedures may be done if dietary changes, lifestyle changes, and other home treatments do not help your symptoms. These procedures can help make the hemorrhoids  smaller or remove them completely. Some of these procedures involve surgery, and others do not. Common procedures include:  Rubber band ligation. Rubber bands are placed at the base of the hemorrhoids to cut off the blood supply to them.  Sclerotherapy. Medicine is injected into the hemorrhoids to shrink them.  Infrared coagulation. A type of light energy is used to get rid of the hemorrhoids.  Hemorrhoidectomy surgery. The hemorrhoids are surgically removed, and the veins that supply them are tied off.  Stapled hemorrhoidopexy surgery. A circular stapling device is used to remove the hemorrhoids and use staples to cut off the blood supply to them.  Follow these instructions at home: Eating and drinking  Eat foods that have a lot of fiber in them, such as whole grains, beans, nuts, fruits, and vegetables. Ask your health care provider about taking products that have added fiber (fiber supplements).  Drink enough fluid to keep your urine clear or pale yellow. Managing pain and swelling  Take warm sitz baths for 20 minutes, 3-4 times a day to ease pain and discomfort.  If directed, apply ice to the affected area. Using ice packs between sitz baths may be helpful. ? Put ice in a plastic bag. ? Place a towel between your skin and the bag. ? Leave the ice on for 20 minutes, 2-3 times a day. General instructions  Take over-the-counter and prescription medicines only as told by your health care provider.  Use medicated creams or suppositories as told.  Exercise regularly.  Go to the bathroom  when you have the urge to have a bowel movement. Do not wait.  Avoid straining to have bowel movements.  Keep the anal area dry and clean. Use wet toilet paper or moist towelettes after a bowel movement.  Do not sit on the toilet for long periods of time. This increases blood pooling and pain. Contact a health care provider if:  You have increasing pain and swelling that are not controlled by  treatment or medicine.  You have uncontrolled bleeding.  You have difficulty having a bowel movement, or you are unable to have a bowel movement.  You have pain or inflammation outside the area of the hemorrhoids. This information is not intended to replace advice given to you by your health care provider. Make sure you discuss any questions you have with your health care provider. Document Released: 02/21/2000 Document Revised: 07/24/2015 Document Reviewed: 11/07/2014 Elsevier Interactive Patient Education  [employer].

## 2017-10-15 ENCOUNTER — Other Ambulatory Visit: Payer: Self-pay | Admitting: Physician Assistant

## 2017-10-15 DIAGNOSIS — R202 Paresthesia of skin: Secondary | ICD-10-CM

## 2017-10-18 ENCOUNTER — Ambulatory Visit: Payer: Self-pay | Admitting: General Surgery

## 2017-10-20 DIAGNOSIS — M6281 Muscle weakness (generalized): Secondary | ICD-10-CM | POA: Diagnosis not present

## 2017-10-20 DIAGNOSIS — M542 Cervicalgia: Secondary | ICD-10-CM | POA: Diagnosis not present

## 2017-10-22 DIAGNOSIS — M542 Cervicalgia: Secondary | ICD-10-CM | POA: Diagnosis not present

## 2017-10-22 DIAGNOSIS — M6281 Muscle weakness (generalized): Secondary | ICD-10-CM | POA: Diagnosis not present

## 2017-10-25 ENCOUNTER — Other Ambulatory Visit: Payer: Self-pay | Admitting: Family Medicine

## 2017-10-25 DIAGNOSIS — M6281 Muscle weakness (generalized): Secondary | ICD-10-CM | POA: Diagnosis not present

## 2017-10-25 DIAGNOSIS — M542 Cervicalgia: Secondary | ICD-10-CM | POA: Diagnosis not present

## 2017-10-25 MED ORDER — EZETIMIBE 10 MG PO TABS: 10.0000 mg | ORAL_TABLET | Freq: Every day | ORAL | 1 refills | Status: DC

## 2017-10-25 NOTE — Telephone Encounter (Signed)
Patient needs refills on Zetia please to [facility] on [address] at [address]

## 2017-10-26 DIAGNOSIS — M6281 Muscle weakness (generalized): Secondary | ICD-10-CM | POA: Diagnosis not present

## 2017-10-26 DIAGNOSIS — M542 Cervicalgia: Secondary | ICD-10-CM | POA: Diagnosis not present

## 2017-10-28 DIAGNOSIS — M6281 Muscle weakness (generalized): Secondary | ICD-10-CM | POA: Diagnosis not present

## 2017-10-28 DIAGNOSIS — M542 Cervicalgia: Secondary | ICD-10-CM | POA: Diagnosis not present

## 2017-11-01 DIAGNOSIS — M6281 Muscle weakness (generalized): Secondary | ICD-10-CM | POA: Diagnosis not present

## 2017-11-01 DIAGNOSIS — M542 Cervicalgia: Secondary | ICD-10-CM | POA: Diagnosis not present

## 2017-11-10 DIAGNOSIS — M542 Cervicalgia: Secondary | ICD-10-CM | POA: Diagnosis not present

## 2017-11-10 DIAGNOSIS — M6281 Muscle weakness (generalized): Secondary | ICD-10-CM | POA: Diagnosis not present

## 2017-11-17 DIAGNOSIS — M6281 Muscle weakness (generalized): Secondary | ICD-10-CM | POA: Diagnosis not present

## 2017-11-17 DIAGNOSIS — M542 Cervicalgia: Secondary | ICD-10-CM | POA: Diagnosis not present

## 2017-12-03 ENCOUNTER — Telehealth: Payer: Self-pay | Admitting: Family Medicine

## 2017-12-03 ENCOUNTER — Other Ambulatory Visit: Payer: Self-pay | Admitting: Family Medicine

## 2017-12-03 DIAGNOSIS — F5101 Primary insomnia: Secondary | ICD-10-CM

## 2017-12-03 MED ORDER — ZOLPIDEM TARTRATE ER 12.5 MG PO TBCR: 12.5000 mg | EXTENDED_RELEASE_TABLET | Freq: Every evening | ORAL | 1 refills | Status: DC | PRN

## 2017-12-03 NOTE — Telephone Encounter (Signed)
Done

## 2017-12-03 NOTE — Telephone Encounter (Signed)
Patient needs refills on Zolpidem 12.5 mg sent to [facility] on [address].

## 2017-12-06 ENCOUNTER — Other Ambulatory Visit: Payer: Self-pay | Admitting: Family Medicine

## 2017-12-06 ENCOUNTER — Telehealth: Payer: Self-pay | Admitting: Family Medicine

## 2017-12-06 MED ORDER — OMEPRAZOLE 40 MG PO CPDR: DELAYED_RELEASE_CAPSULE | ORAL | 1 refills | Status: DC

## 2017-12-06 MED ORDER — OMEPRAZOLE 40 MG PO CPDR: DELAYED_RELEASE_CAPSULE | ORAL | 3 refills | Status: DC

## 2017-12-06 NOTE — Telephone Encounter (Signed)
Patient needs refill on Omeprazole 40 mg, #90 with 3 refills please sent to [facility] on [address].

## 2017-12-06 NOTE — Telephone Encounter (Signed)
Done

## 2017-12-29 DIAGNOSIS — M542 Cervicalgia: Secondary | ICD-10-CM | POA: Insufficient documentation

## 2018-01-03 ENCOUNTER — Telehealth: Payer: Self-pay | Admitting: Family Medicine

## 2018-01-03 DIAGNOSIS — M542 Cervicalgia: Secondary | ICD-10-CM | POA: Diagnosis not present

## 2018-01-03 NOTE — Telephone Encounter (Signed)
Check with [facility] to see if a refill is waiting at that pharmacy for this patient - refilled on 12-03-17 with one additional refill.

## 2018-01-03 NOTE — Telephone Encounter (Signed)
Patient was advised that he has the additional refill for his Zolpidem at the pharmacy and will be available for pick up on tomorrow 01/04/2018.

## 2018-01-03 NOTE — Telephone Encounter (Signed)
Patient needs refill on Zolpidem 12.5 mg. Sent to [facility]

## 2018-01-06 DIAGNOSIS — M5412 Radiculopathy, cervical region: Secondary | ICD-10-CM | POA: Diagnosis not present

## 2018-01-14 DIAGNOSIS — M5030 Other cervical disc degeneration, unspecified cervical region: Secondary | ICD-10-CM | POA: Diagnosis not present

## 2018-01-14 DIAGNOSIS — M5412 Radiculopathy, cervical region: Secondary | ICD-10-CM | POA: Diagnosis not present

## 2018-01-21 DIAGNOSIS — M5412 Radiculopathy, cervical region: Secondary | ICD-10-CM | POA: Diagnosis not present

## 2018-01-21 DIAGNOSIS — R202 Paresthesia of skin: Secondary | ICD-10-CM | POA: Diagnosis not present

## 2018-01-21 DIAGNOSIS — M4722 Other spondylosis with radiculopathy, cervical region: Secondary | ICD-10-CM | POA: Diagnosis not present

## 2018-01-21 DIAGNOSIS — R200 Anesthesia of skin: Secondary | ICD-10-CM | POA: Diagnosis not present

## 2018-01-25 ENCOUNTER — Ambulatory Visit (INDEPENDENT_AMBULATORY_CARE_PROVIDER_SITE_OTHER): Payer: BLUE CROSS/BLUE SHIELD | Admitting: Family Medicine

## 2018-01-25 DIAGNOSIS — Z23 Encounter for immunization: Secondary | ICD-10-CM

## 2018-01-27 DIAGNOSIS — M5412 Radiculopathy, cervical region: Secondary | ICD-10-CM | POA: Diagnosis not present

## 2018-01-27 DIAGNOSIS — M4802 Spinal stenosis, cervical region: Secondary | ICD-10-CM | POA: Diagnosis not present

## 2018-01-27 DIAGNOSIS — G5601 Carpal tunnel syndrome, right upper limb: Secondary | ICD-10-CM | POA: Diagnosis not present

## 2018-01-27 DIAGNOSIS — G5621 Lesion of ulnar nerve, right upper limb: Secondary | ICD-10-CM | POA: Diagnosis not present

## 2018-02-01 ENCOUNTER — Other Ambulatory Visit: Payer: Self-pay | Admitting: Family Medicine

## 2018-02-01 DIAGNOSIS — F5101 Primary insomnia: Secondary | ICD-10-CM

## 2018-02-10 DIAGNOSIS — Z6825 Body mass index (BMI) 25.0-25.9, adult: Secondary | ICD-10-CM | POA: Diagnosis not present

## 2018-02-10 DIAGNOSIS — R030 Elevated blood-pressure reading, without diagnosis of hypertension: Secondary | ICD-10-CM | POA: Diagnosis not present

## 2018-02-10 DIAGNOSIS — G5601 Carpal tunnel syndrome, right upper limb: Secondary | ICD-10-CM | POA: Diagnosis not present

## 2018-02-10 DIAGNOSIS — G5621 Lesion of ulnar nerve, right upper limb: Secondary | ICD-10-CM | POA: Diagnosis not present

## 2018-02-21 DIAGNOSIS — G5621 Lesion of ulnar nerve, right upper limb: Secondary | ICD-10-CM | POA: Diagnosis not present

## 2018-02-21 DIAGNOSIS — G5601 Carpal tunnel syndrome, right upper limb: Secondary | ICD-10-CM | POA: Diagnosis not present

## 2018-02-28 ENCOUNTER — Other Ambulatory Visit: Payer: Self-pay | Admitting: Family Medicine

## 2018-02-28 DIAGNOSIS — F5101 Primary insomnia: Secondary | ICD-10-CM

## 2018-03-10 ENCOUNTER — Ambulatory Visit (INDEPENDENT_AMBULATORY_CARE_PROVIDER_SITE_OTHER): Payer: BLUE CROSS/BLUE SHIELD | Admitting: Family Medicine

## 2018-03-10 ENCOUNTER — Encounter: Payer: Self-pay | Admitting: Family Medicine

## 2018-03-10 VITALS — BP 102/80 | HR 52 | Temp 98.0°F | Resp 16 | Ht 70.0 in | Wt 179.0 lb

## 2018-03-10 DIAGNOSIS — G4700 Insomnia, unspecified: Secondary | ICD-10-CM

## 2018-03-10 DIAGNOSIS — Z23 Encounter for immunization: Secondary | ICD-10-CM

## 2018-03-10 DIAGNOSIS — G629 Polyneuropathy, unspecified: Secondary | ICD-10-CM

## 2018-03-10 DIAGNOSIS — Z0000 Encounter for general adult medical examination without abnormal findings: Secondary | ICD-10-CM | POA: Diagnosis not present

## 2018-03-10 DIAGNOSIS — Z125 Encounter for screening for malignant neoplasm of prostate: Secondary | ICD-10-CM | POA: Diagnosis not present

## 2018-03-10 DIAGNOSIS — E7800 Pure hypercholesterolemia, unspecified: Secondary | ICD-10-CM

## 2018-03-10 DIAGNOSIS — Z9889 Other specified postprocedural states: Secondary | ICD-10-CM

## 2018-03-10 NOTE — Patient Instructions (Signed)

## 2018-03-10 NOTE — Progress Notes (Signed)
Patient: Dale Gray, Male    DOB: 1955-03-10, 63 y.o.   MRN: [id number] Visit Date: 03/10/2018  Today's Provider: Dortha Kern, PA   Chief Complaint  Patient presents with  . Annual Exam   Subjective:    Annual physical exam Dale Gray is a 63 y.o. male who presents today for health maintenance and complete physical. He feels well. He reports exercising yes. He reports he is sleeping fairly well.  ---------------------------------------------------------------   Review of Systems  All other systems reviewed and are negative.   Social History      He  reports that he has never smoked. He has never used smokeless tobacco. He reports that he does not drink alcohol or use drugs.       Social History   Socioeconomic History  . Marital status: Married    Spouse name: Not on file  . Number of children: Not on file  . Years of education: Not on file  . Highest education level: Not on file  Occupational History  . Not on file  Social Needs  . Financial resource strain: Not on file  . Food insecurity:    Worry: Not on file    Inability: Not on file  . Transportation needs:    Medical: Not on file    Non-medical: Not on file  Tobacco Use  . Smoking status: Never Smoker  . Smokeless tobacco: Never Used  Substance and Sexual Activity  . Alcohol use: No  . Drug use: No  . Sexual activity: Not on file  Lifestyle  . Physical activity:    Days per week: Not on file    Minutes per session: Not on file  . Stress: Not on file  Relationships  . Social connections:    Talks on phone: Not on file    Gets together: Not on file    Attends religious service: Not on file    Active member of club or organization: Not on file    Attends meetings of clubs or organizations: Not on file    Relationship status: Not on file  Other Topics Concern  . Not on file  Social History Narrative  . Not on file   Past Medical History:  Diagnosis Date  . GERD  (gastroesophageal reflux disease)   . Hemorrhoid   . Hyperlipidemia   . Joint pain   . Reflux    Patient Active Problem List   Diagnosis Date Noted  . Special screening for malignant neoplasms, colon   . Personal history of colonic polyps   . Benign neoplasm of ascending colon   . Peripheral neuropathy 01/24/2015  . Anal fissure 01/22/2015  . Disorder of coccyx 01/22/2015  . History of digestive disease 01/22/2015  . HLD (hyperlipidemia) 01/22/2015  . Anemia, iron deficiency 01/22/2015  . Yeast dermatitis 06/22/2012  . Thrombosed hemorrhoids   . Fam hx-ischem heart disease 07/20/2007  . Acid reflux 05/26/2006  . Cannot sleep 10/12/2005  . Hypercholesterolemia without hypertriglyceridemia 10/12/2005   Past Surgical History:  Procedure Laterality Date  . ANAL FISSURE REPAIR  2014  . COLONOSCOPY WITH PROPOFOL N/A 03/19/2017   Procedure: COLONOSCOPY WITH PROPOFOL;  Surgeon: Pasty Spillers, MD;  Location: [facility];  Service: Endoscopy;  Laterality: N/A;  . TYMPANOPLASTY Left 04/30/2014  . VASECTOMY  01/06/2002   Family History        Family Status  Relation Name Status  . Mother  Alive  . Father  Alive  . Sister  Alive  . Brother  Alive  . Daughter  Alive  . Daughter  Alive  . Mat Aunt  (Not Specified)  . Mat Uncle  (Not Specified)  . Neg Hx  (Not Specified)        His family history includes Asthma in his mother; CAD in his father; Diabetes in his maternal aunt and maternal uncle; Healthy in his brother, daughter, daughter, and sister; Heart disease in his father and mother. There is no history of Colon cancer.     Allergies  Allergen Reactions  . Lovastatin     Sleep disturbance and extremity pains    Current Outpatient Medications:  .  aspirin 81 MG tablet, Take 81 mg by mouth daily., Disp: , Rfl:  .  ezetimibe (ZETIA) 10 MG tablet, Take 1 tablet (10 mg total) by mouth daily., Disp: 90 tablet, Rfl: 1 .  fish oil-omega-3 fatty acids 1000 MG capsule,  Take 2 g by mouth daily., Disp: , Rfl:  .  meloxicam (MOBIC) 15 MG tablet, TAKE 1 TABLET BY MOUTH EVERY DAY, Disp: 30 tablet, Rfl: 5 .  omeprazole (PRILOSEC) 40 MG capsule, TAKE (1) CAPSULE BY MOUTH ONCE DAILY., Disp: 90 capsule, Rfl: 3 .  pregabalin (LYRICA) 100 MG capsule, TAKE (1) CAPSULE BY MOUTH THREE TIMES DAILY, Disp: 90 capsule, Rfl: 3 .  zolpidem (AMBIEN CR) 12.5 MG CR tablet, TAKE 1 TABLET BY MOUTH AT BEDTIME AS NEEDED, Disp: 30 tablet, Rfl: 0   Patient Care Team: Shahan Starks, Jodell Cipro, PA as PCP - General (Family Medicine) Lemar Livings, Merrily Pew, MD as Consulting Physician (General Surgery)      Objective:   Vitals: BP 102/80 (BP Location: Right Arm, Patient Position: Sitting, Cuff Size: Large)   Pulse (!) 52   Temp 98 F (36.7 C) (Oral)   Resp 16   Wt 179 lb (81.2 kg)   SpO2 96%   BMI 25.68 kg/m    Vitals:   03/10/18 0902  BP: 102/80  Pulse: (!) 52  Resp: 16  Temp: 98 F (36.7 C)  TempSrc: Oral  SpO2: 96%  Weight: 179 lb (81.2 kg)    Physical Exam Constitutional:      Appearance: He is well-developed.  HENT:     Head: Normocephalic and atraumatic.     Right Ear: External ear normal.     Left Ear: External ear normal.     Nose: Nose normal.  Eyes:     General:        Right eye: No discharge.     Conjunctiva/sclera: Conjunctivae normal.     Pupils: Pupils are equal, round, and reactive to light.  Neck:     Musculoskeletal: Normal range of motion and neck supple.     Thyroid: No thyromegaly.     Trachea: No tracheal deviation.  Cardiovascular:     Rate and Rhythm: Normal rate and regular rhythm.     Heart sounds: Normal heart sounds. No murmur.  Pulmonary:     Effort: Pulmonary effort is normal. No respiratory distress.     Breath sounds: Normal breath sounds. No wheezing or rales.  Chest:     Chest wall: No tenderness.  Abdominal:     General: There is no distension.     Palpations: Abdomen is soft. There is no mass.     Tenderness: There is no  abdominal tenderness. There is no guarding or rebound.  Genitourinary:    Comments: Normal exam by surgeon (Dr.  Byrnett) in August 2019 and had colonoscopy January 2019 (Dr. Maximino Greenland) with two benign polyps and few small diverticula. Musculoskeletal: Normal range of motion.        General: No tenderness.  Lymphadenopathy:     Cervical: No cervical adenopathy.  Skin:    General: Skin is warm and dry.     Findings: No erythema or rash.  Neurological:     Mental Status: He is alert and oriented to person, place, and time.     Cranial Nerves: No cranial nerve deficit.     Motor: No abnormal muscle tone.     Coordination: Coordination normal.     Deep Tendon Reflexes: Reflexes are normal and symmetric. Reflexes normal.  Psychiatric:        Behavior: Behavior normal.        Thought Content: Thought content normal.        Judgment: Judgment normal.     Depression Screen PHQ 2/9 Scores 03/10/2018 02/18/2017 02/14/2016  PHQ - 2 Score 0 0 0  PHQ- 9 Score 0 1 -   Assessment & Plan:     Routine Health Maintenance and Physical Exam  Exercise Activities and Dietary recommendations Goals   [place] and with his job daily.    Immunization History  Administered Date(s) Administered  . Influenza,inj,Quad PF,6+ Mos 12/13/2015, 11/26/2016, 01/25/2018  . Td 02/18/2017  . Tdap 05/26/2006   Health Maintenance  Topic Date Due  . COLONOSCOPY  03/19/2022  . TETANUS/TDAP  02/19/2027  . INFLUENZA VACCINE  Completed  . Hepatitis C Screening  Completed  . HIV Screening  Completed    Discussed health benefits of physical activity, and encouraged him to engage in regular exercise appropriate for his age and condition.    -------------------------------------------------------------------- 1. Annual physical exam Good general health. Will give first Shingrix today. Remainder of immunizations are up to date. Given anticipatory counseling. Has colonoscopy January 2019 and DRE in  August 2019 by Dr. Lemar Livings (surgeon) when thrombosed hemorrhoid lanced. Prostate exam documented. Will check routine labs and follow up pending reports. - CBC with Differential/Platelet - Comprehensive metabolic panel - Lipid panel  2. Peripheral polyneuropathy Unchanged control with Lyrica. Some sleep disturbance frequently that is helped with Ambien at bedtime. May need to try Alpha Lipoic Acid supplement, also. Check routine labs. - CBC with Differential/Platelet - Comprehensive metabolic panel - TSH  3. Hypercholesterolemia without hypertriglyceridemia Tolerating the Zetia 10 mg qd without side effects. Continues to maintain stable weight and low fat diet. Recheck CMP, TSH and Lipid Panel today. Follow up pending reports. - Comprehensive metabolic panel - Lipid panel - TSH  4. Insomnia, unspecified type Still having problems with attaining and maintaining sleep unless he uses the Ambien most nights. Some of the sleep disturbance is due to peripheral neuropathy in feet. Lyrica continues to help with pains. Recheck routine labs and continue present regimen. - CBC with Differential/Platelet - Comprehensive metabolic panel - TSH  5. Need for shingles vaccine #1 Shingrix given today and #2 in 3 months. - Varicella-zoster vaccine IM (Shingrix)  6. Screening PSA (prostate specific antigen) Exam by colonoscopy per Dr. Maximino Greenland January 2019 and hemorrhoid I&D per Dr. Lemar Livings in August 2019. Recheck PSA. - PSA  7. History of carpal tunnel surgery of right wrist Had NCS and CTS surgery of the right wrist by Dr. Lovell Sheehan (neurosurgeon) on 02-21-18. Scar healing well and strength slow to return, but improving.   Dortha Kern, PA  [facility]  [facility]

## 2018-03-11 ENCOUNTER — Telehealth: Payer: Self-pay

## 2018-03-11 LAB — LIPID PANEL
CHOLESTEROL TOTAL: 229 mg/dL — AB (ref 100–199)
Chol/HDL Ratio: 4.9 ratio (ref 0.0–5.0)
HDL: 47 mg/dL (ref >39)
LDL CALC: 157 mg/dL — AB (ref 0–99)
Triglycerides: 127 mg/dL (ref 0–149)
VLDL CHOLESTEROL CAL: 25 mg/dL (ref 5–40)

## 2018-03-11 LAB — CBC WITH DIFFERENTIAL/PLATELET
Basophils Absolute: 0.1 10*3/uL (ref 0.0–0.2)
Basos: 1 %
EOS (ABSOLUTE): 0.2 10*3/uL (ref 0.0–0.4)
EOS: 3 %
HEMATOCRIT: 42.2 % (ref 37.5–51.0)
Hemoglobin: 14.5 g/dL (ref 13.0–17.7)
IMMATURE GRANS (ABS): 0 10*3/uL (ref 0.0–0.1)
Immature Granulocytes: 0 %
LYMPHS ABS: 1.4 10*3/uL (ref 0.7–3.1)
LYMPHS: 22 %
MCH: 30.2 pg (ref 26.6–33.0)
MCHC: 34.4 g/dL (ref 31.5–35.7)
MCV: 88 fL (ref 79–97)
MONOCYTES: 10 %
Monocytes Absolute: 0.6 10*3/uL (ref 0.1–0.9)
NEUTROS ABS: 3.9 10*3/uL (ref 1.4–7.0)
Neutrophils: 64 %
Platelets: 295 10*3/uL (ref 150–450)
RBC: 4.8 x10E6/uL (ref 4.14–5.80)
RDW: 12.7 % (ref 12.3–15.4)
WBC: 6.2 10*3/uL (ref 3.4–10.8)

## 2018-03-11 LAB — COMPREHENSIVE METABOLIC PANEL
ALK PHOS: 64 IU/L (ref 39–117)
ALT: 13 IU/L (ref 0–44)
AST: 15 IU/L (ref 0–40)
Albumin/Globulin Ratio: 2 (ref 1.2–2.2)
Albumin: 4.7 g/dL (ref 3.6–4.8)
BILIRUBIN TOTAL: 0.8 mg/dL (ref 0.0–1.2)
BUN/Creatinine Ratio: 14 (ref 10–24)
BUN: 16 mg/dL (ref 8–27)
CO2: 23 mmol/L (ref 20–29)
CREATININE: 1.15 mg/dL (ref 0.76–1.27)
Calcium: 9.6 mg/dL (ref 8.6–10.2)
Chloride: 104 mmol/L (ref 96–106)
GFR calc non Af Amer: 69 mL/min/{1.73_m2} (ref >59)
GFR, EST AFRICAN AMERICAN: 80 mL/min/{1.73_m2} (ref >59)
GLOBULIN, TOTAL: 2.4 g/dL (ref 1.5–4.5)
GLUCOSE: 87 mg/dL (ref 65–99)
Potassium: 4.4 mmol/L (ref 3.5–5.2)
SODIUM: 144 mmol/L (ref 134–144)
TOTAL PROTEIN: 7.1 g/dL (ref 6.0–8.5)

## 2018-03-11 LAB — PSA: PROSTATE SPECIFIC AG, SERUM: 6.5 ng/mL — AB (ref 0.0–4.0)

## 2018-03-11 LAB — TSH: TSH: 1.44 u[IU]/mL (ref 0.450–4.500)

## 2018-03-11 NOTE — Telephone Encounter (Signed)
[id number] 

## 2018-03-11 NOTE — Telephone Encounter (Signed)
-----   Message from Margo Common, [place] sent at 03/11/2018  7:58 AM EST ----- All blood tests normal except PSA elevated. This can occur with age, infections or after sex. Not always related to cancers. Should drink extra fluids and recheck PSA and prostate exam in 6 months (sooner if having any urine flow problems). LDL cholesterol is slowly improving - continue present medications.

## 2018-03-14 NOTE — Telephone Encounter (Signed)
Pt returned missed call.  Please call pt back. ° °Thanks, °[facility] °

## 2018-03-14 NOTE — Telephone Encounter (Signed)
Patient advised and scheduled 6 months follow-up.

## 2018-03-14 NOTE — Telephone Encounter (Signed)
LMTCB

## 2018-03-31 DIAGNOSIS — H5203 Hypermetropia, bilateral: Secondary | ICD-10-CM | POA: Diagnosis not present

## 2018-03-31 DIAGNOSIS — H52223 Regular astigmatism, bilateral: Secondary | ICD-10-CM | POA: Diagnosis not present

## 2018-03-31 DIAGNOSIS — H524 Presbyopia: Secondary | ICD-10-CM | POA: Diagnosis not present

## 2018-04-03 ENCOUNTER — Other Ambulatory Visit: Payer: Self-pay | Admitting: Family Medicine

## 2018-04-03 DIAGNOSIS — F5101 Primary insomnia: Secondary | ICD-10-CM

## 2018-04-21 ENCOUNTER — Other Ambulatory Visit: Payer: Self-pay | Admitting: Family Medicine

## 2018-05-06 ENCOUNTER — Other Ambulatory Visit: Payer: Self-pay | Admitting: Family Medicine

## 2018-05-06 DIAGNOSIS — F5101 Primary insomnia: Secondary | ICD-10-CM

## 2018-05-09 ENCOUNTER — Other Ambulatory Visit: Payer: Self-pay | Admitting: Family Medicine

## 2018-05-09 ENCOUNTER — Ambulatory Visit (INDEPENDENT_AMBULATORY_CARE_PROVIDER_SITE_OTHER): Payer: BLUE CROSS/BLUE SHIELD | Admitting: Family Medicine

## 2018-05-09 ENCOUNTER — Telehealth: Payer: Self-pay | Admitting: Family Medicine

## 2018-05-09 DIAGNOSIS — Z23 Encounter for immunization: Secondary | ICD-10-CM

## 2018-05-09 DIAGNOSIS — F5101 Primary insomnia: Secondary | ICD-10-CM

## 2018-05-09 MED ORDER — ZOLPIDEM TARTRATE ER 12.5 MG PO TBCR: 12.5000 mg | EXTENDED_RELEASE_TABLET | Freq: Every evening | ORAL | 0 refills | Status: DC | PRN

## 2018-05-09 NOTE — Telephone Encounter (Signed)
Patient thought he was to get labs today to check PSA.  Please verify and let him know.

## 2018-05-09 NOTE — Telephone Encounter (Signed)
Patient is needing refill on Ambien sent to [facility].  The pharmacy sent a request last week for this but has not been done.

## 2018-05-09 NOTE — Telephone Encounter (Signed)
Patient advised PSA was to be rechecked around June 2020.

## 2018-05-10 ENCOUNTER — Telehealth: Payer: Self-pay | Admitting: *Deleted

## 2018-05-10 NOTE — Telephone Encounter (Signed)
Agree with plan 

## 2018-05-10 NOTE — Telephone Encounter (Signed)
Patient called office concerning shingles vaccine he was given yesterday. Patient stated his left arm is sore today around the injection site. Also patient stated he started having muscle aches yesterday afternoon. This morning patient still has muscle aches and a headache. Per Simona Huh advised patient to take Tylenol or Advil to help with symptoms. If not better in 3 days or symptoms worsen call back. Also call back if he develops a fever.

## 2018-05-14 ENCOUNTER — Other Ambulatory Visit: Payer: Self-pay | Admitting: Family Medicine

## 2018-05-14 DIAGNOSIS — F5101 Primary insomnia: Secondary | ICD-10-CM

## 2018-05-23 ENCOUNTER — Ambulatory Visit: Payer: Self-pay | Admitting: Family Medicine

## 2018-06-05 ENCOUNTER — Other Ambulatory Visit: Payer: Self-pay | Admitting: Family Medicine

## 2018-06-05 DIAGNOSIS — F5101 Primary insomnia: Secondary | ICD-10-CM

## 2018-07-05 ENCOUNTER — Other Ambulatory Visit: Payer: Self-pay | Admitting: Family Medicine

## 2018-07-05 DIAGNOSIS — F5101 Primary insomnia: Secondary | ICD-10-CM

## 2018-07-05 MED ORDER — ZOLPIDEM TARTRATE ER 12.5 MG PO TBCR: 12.5000 mg | EXTENDED_RELEASE_TABLET | Freq: Every evening | ORAL | 0 refills | Status: DC | PRN

## 2018-07-05 NOTE — Telephone Encounter (Signed)
Patient needs refill on Zolpidem 12.5 mg sent to [facility] on [address].

## 2018-07-19 ENCOUNTER — Other Ambulatory Visit: Payer: Self-pay | Admitting: Family Medicine

## 2018-07-19 DIAGNOSIS — G629 Polyneuropathy, unspecified: Secondary | ICD-10-CM

## 2018-07-19 MED ORDER — PREGABALIN 100 MG PO CAPS: ORAL_CAPSULE | ORAL | 3 refills | Status: DC

## 2018-07-19 NOTE — Telephone Encounter (Signed)
Patient would like to get GENERIC Lyrica.  [facility] [address].

## 2018-07-20 ENCOUNTER — Telehealth: Payer: Self-pay | Admitting: Family Medicine

## 2018-07-20 NOTE — Telephone Encounter (Signed)
Just talked to Octavia Bruckner about his Lyrica RX.  He can go to [facility] and get Lyrica much much cheaper.   He needs a written RX to hand deliver to [facility].   Will you please cancel the rx to [facility] that was sen on 07/19/18?    Also he only takes 50 mg. Three times a day instead of the 100 mg. As on chart.    If you will let me know when RX is ready, I will call him to let him know to pick it up.  Arbie Cookey

## 2018-07-21 ENCOUNTER — Other Ambulatory Visit: Payer: Self-pay | Admitting: Family Medicine

## 2018-07-21 DIAGNOSIS — G629 Polyneuropathy, unspecified: Secondary | ICD-10-CM

## 2018-07-21 MED ORDER — PREGABALIN 50 MG PO CAPS: 50.0000 mg | ORAL_CAPSULE | Freq: Three times a day (TID) | ORAL | 3 refills | Status: DC

## 2018-07-21 NOTE — Progress Notes (Signed)
Changing pharmacies for better price point due to insurance not covering it well. Written prescription given to patient to establish at the new pharmacy. Discontinued the Lyrica 100 mg TID and changed to Lyrica 50 mg TID #90 & 3 RF for chronic peripheral neuropathy.

## 2018-07-21 NOTE — Telephone Encounter (Signed)
Please review

## 2018-07-21 NOTE — Telephone Encounter (Signed)
Done. See note in chart.

## 2018-08-05 ENCOUNTER — Other Ambulatory Visit: Payer: Self-pay | Admitting: Family Medicine

## 2018-08-05 DIAGNOSIS — F5101 Primary insomnia: Secondary | ICD-10-CM

## 2018-09-05 ENCOUNTER — Other Ambulatory Visit: Payer: Self-pay | Admitting: Family Medicine

## 2018-09-05 DIAGNOSIS — F5101 Primary insomnia: Secondary | ICD-10-CM

## 2018-09-05 NOTE — Telephone Encounter (Signed)
Patient requesting refill on Ambien CR 12.5 sent to [facility] on [address]

## 2018-09-05 NOTE — Telephone Encounter (Signed)
Refill has been send in.

## 2018-09-12 ENCOUNTER — Encounter: Payer: Self-pay | Admitting: Family Medicine

## 2018-09-12 ENCOUNTER — Ambulatory Visit (INDEPENDENT_AMBULATORY_CARE_PROVIDER_SITE_OTHER): Payer: BC Managed Care – PPO | Admitting: Family Medicine

## 2018-09-12 ENCOUNTER — Other Ambulatory Visit: Payer: Self-pay

## 2018-09-12 VITALS — BP 114/72 | HR 82 | Temp 97.5°F | Resp 18 | Wt 170.6 lb

## 2018-09-12 DIAGNOSIS — R972 Elevated prostate specific antigen [PSA]: Secondary | ICD-10-CM | POA: Diagnosis not present

## 2018-09-12 DIAGNOSIS — G479 Sleep disorder, unspecified: Secondary | ICD-10-CM

## 2018-09-12 DIAGNOSIS — E7800 Pure hypercholesterolemia, unspecified: Secondary | ICD-10-CM

## 2018-09-12 DIAGNOSIS — G629 Polyneuropathy, unspecified: Secondary | ICD-10-CM

## 2018-09-12 NOTE — Progress Notes (Signed)
Patient: Dale Gray Male    DOB: May 11, 1955   63 y.o.   MRN: [id number] Visit Date: 09/12/2018  Today's Provider: Vernie Murders, PA   Chief Complaint  Patient presents with  . Follow-up   Subjective:     HPI  6 Month Follow Up for PSA.  Past Medical History:  Diagnosis Date  . GERD (gastroesophageal reflux disease)   . Hemorrhoid   . Hyperlipidemia   . Joint pain   . Reflux    Past Surgical History:  Procedure Laterality Date  . ANAL FISSURE REPAIR  2014  . COLONOSCOPY WITH PROPOFOL N/A 03/19/2017   Procedure: COLONOSCOPY WITH PROPOFOL;  Surgeon: Virgel Manifold, MD;  Location: [facility] ENDOSCOPY;  Service: Endoscopy;  Laterality: N/A;  . TYMPANOPLASTY Left 04/30/2014  . VASECTOMY  01/06/2002   Family History  Problem Relation Age of Onset  . Heart disease Mother   . Asthma Mother   . Heart disease Father   . CAD Father   . Healthy Sister   . Healthy Brother   . Healthy Daughter   . Healthy Daughter   . Diabetes Maternal Aunt   . Diabetes Maternal Uncle   . Colon cancer Neg Hx    Allergies  Allergen Reactions  . Lovastatin     Sleep disturbance and extremity pains    Current Outpatient Medications:  .  aspirin 81 MG tablet, Take 81 mg by mouth daily., Disp: , Rfl:  .  ezetimibe (ZETIA) 10 MG tablet, TAKE 1 TABLET(10 MG) BY MOUTH DAILY, Disp: 90 tablet, Rfl: 3 .  fish oil-omega-3 fatty acids 1000 MG capsule, Take 2 g by mouth daily., Disp: , Rfl:  .  omeprazole (PRILOSEC) 40 MG capsule, TAKE (1) CAPSULE BY MOUTH ONCE DAILY., Disp: 90 capsule, Rfl: 3 .  pregabalin (LYRICA) 50 MG capsule, Take 1 capsule (50 mg total) by mouth 3 (three) times daily. TAKE (1) CAPSULE BY MOUTH THREE TIMES DAILY, Disp: 90 capsule, Rfl: 3 .  zolpidem (AMBIEN CR) 12.5 MG CR tablet, TAKE 1 TABLET BY MOUTH EVERY NIGHT AT BEDTIME AS NEEDED, Disp: 30 tablet, Rfl: 0 .  meloxicam (MOBIC) 15 MG tablet, TAKE 1 TABLET BY MOUTH EVERY DAY (Patient not taking: Reported on  09/12/2018), Disp: 30 tablet, Rfl: 5  Review of Systems  All other systems reviewed and are negative.  Social History   Tobacco Use  . Smoking status: Never Smoker  . Smokeless tobacco: Never Used  Substance Use Topics  . Alcohol use: No     Objective:   BP 114/72 (BP Location: Right Arm, Patient Position: Sitting, Cuff Size: Normal)   Pulse 82   Temp (!) 97.5 F (36.4 C) (Oral)   Resp 18   Wt 170 lb 9.6 oz (77.4 kg)   SpO2 99%   BMI 24.48 kg/m  Vitals:   09/12/18 0808  BP: 114/72  Pulse: 82  Resp: 18  Temp: (!) 97.5 F (36.4 C)  TempSrc: Oral  SpO2: 99%  Weight: 170 lb 9.6 oz (77.4 kg)   Physical Exam Constitutional:      General: He is not in acute distress.    Appearance: He is well-developed.  HENT:     Head: Normocephalic and atraumatic.     Right Ear: Hearing normal.     Left Ear: Hearing normal.     Nose: Nose normal.  Eyes:     General: Lids are normal. No scleral icterus.  Right eye: No discharge.        Left eye: No discharge.     Conjunctiva/sclera: Conjunctivae normal.  Cardiovascular:     Rate and Rhythm: Normal rate and regular rhythm.     Heart sounds: Normal heart sounds.  Pulmonary:     Effort: Pulmonary effort is normal. No respiratory distress.     Breath sounds: Normal breath sounds.  Abdominal:     General: Bowel sounds are normal.     Palpations: Abdomen is soft.  Musculoskeletal: Normal range of motion.  Skin:    Findings: No lesion or rash.  Neurological:     Mental Status: He is alert and oriented to person, place, and time.     Sensory: Sensory deficit present.  Psychiatric:        Speech: Speech normal.        Behavior: Behavior normal.        Thought Content: Thought content normal.        Assessment & Plan    1. Elevated PSA PSA was 3.10 February 2016 and up to 6.13 March 2018. No dysuria, hesitancy, frequency, nocturia or hematuria. Will recheck PSA and renal function tests to assess progress. - PSA -  Comprehensive metabolic panel  2. Peripheral polyneuropathy Pains in feet unchanged but fairly well controlled with use of Lyrica 50 mg TID. Continues to work very physically daily. Recheck follow up labs and continue present regimen. - Comprehensive metabolic panel - CBC with Differential/Platelet  3. Sleep disturbance Sleep disrupted every couple hours if he does not take the Ambien. Peripheral neuropathy adds to sleep disturbance, also. When he takes the Ambien he goes to bed at 10:00 pm and gets up at 4:00 am each day. Without it he wakes up at 1:00-2:00 am. No leftover drowsiness, focus or concentration issues during the day. Continue present regimen.  4. Pure hypercholesterolemia Tolerates Zetia 10 mg qd and trying to follow a low fat diet. Maintaining good weight control. Recheck CMP and Lipid Panel. - Comprehensive metabolic panel - Lipid panel     Vernie Murders, PA  [facility]

## 2018-09-13 ENCOUNTER — Other Ambulatory Visit: Payer: Self-pay | Admitting: Family Medicine

## 2018-09-13 ENCOUNTER — Telehealth: Payer: Self-pay

## 2018-09-13 DIAGNOSIS — R972 Elevated prostate specific antigen [PSA]: Secondary | ICD-10-CM

## 2018-09-13 LAB — COMPREHENSIVE METABOLIC PANEL
ALT: 14 IU/L (ref 0–44)
AST: 29 IU/L (ref 0–40)
Albumin/Globulin Ratio: 2 (ref 1.2–2.2)
Albumin: 4.9 g/dL (ref 3.8–4.9)
Alkaline Phosphatase: 84 IU/L (ref 39–117)
BUN/Creatinine Ratio: 12 (ref 10–24)
BUN: 14 mg/dL (ref 8–27)
Bilirubin Total: 0.8 mg/dL (ref 0.0–1.2)
CO2: 20 mmol/L (ref 20–29)
Calcium: 9.7 mg/dL (ref 8.6–10.2)
Chloride: 105 mmol/L (ref 96–106)
Creatinine, Ser: 1.13 mg/dL (ref 0.76–1.27)
GFR calc Af Amer: 81 mL/min/{1.73_m2} (ref >59)
GFR calc non Af Amer: 70 mL/min/{1.73_m2} (ref >59)
Globulin, Total: 2.4 g/dL (ref 1.5–4.5)
Glucose: 84 mg/dL (ref 65–99)
Potassium: 4.7 mmol/L (ref 3.5–5.2)
Sodium: 142 mmol/L (ref 134–144)
Total Protein: 7.3 g/dL (ref 6.0–8.5)

## 2018-09-13 LAB — CBC WITH DIFFERENTIAL/PLATELET
Basophils Absolute: 0.1 10*3/uL (ref 0.0–0.2)
Basos: 1 %
EOS (ABSOLUTE): 0.3 10*3/uL (ref 0.0–0.4)
Eos: 3 %
Hematocrit: 45.1 % (ref 37.5–51.0)
Hemoglobin: 14.7 g/dL (ref 13.0–17.7)
Immature Grans (Abs): 0 10*3/uL (ref 0.0–0.1)
Immature Granulocytes: 0 %
Lymphocytes Absolute: 1.3 10*3/uL (ref 0.7–3.1)
Lymphs: 16 %
MCH: 29.6 pg (ref 26.6–33.0)
MCHC: 32.6 g/dL (ref 31.5–35.7)
MCV: 91 fL (ref 79–97)
Monocytes Absolute: 0.6 10*3/uL (ref 0.1–0.9)
Monocytes: 7 %
Neutrophils Absolute: 5.9 10*3/uL (ref 1.4–7.0)
Neutrophils: 73 %
Platelets: 348 10*3/uL (ref 150–450)
RBC: 4.96 x10E6/uL (ref 4.14–5.80)
RDW: 13 % (ref 11.6–15.4)
WBC: 8.2 10*3/uL (ref 3.4–10.8)

## 2018-09-13 LAB — LIPID PANEL
Chol/HDL Ratio: 4.5 ratio (ref 0.0–5.0)
Cholesterol, Total: 207 mg/dL — ABNORMAL HIGH (ref 100–199)
HDL: 46 mg/dL (ref >39)
LDL Calculated: 141 mg/dL — ABNORMAL HIGH (ref 0–99)
Triglycerides: 101 mg/dL (ref 0–149)
VLDL Cholesterol Cal: 20 mg/dL (ref 5–40)

## 2018-09-13 LAB — PSA: Prostate Specific Ag, Serum: 7.3 ng/mL — ABNORMAL HIGH (ref 0.0–4.0)

## 2018-09-13 NOTE — Telephone Encounter (Signed)
Placed order for referral to urologist.

## 2018-09-13 NOTE — Telephone Encounter (Signed)
-----   Message from Margo Common, [place] sent at 09/13/2018  9:19 AM EDT ----- No anemia or signs of infection in blood counts. Normal liver and kidney function tests. LDL cholesterol is slowly improving. Continue Zetia and Red Yeast Rice with Krill Oil. PSA is continuing to climb. Recommend scheduling urology referral to evaluate for possible prostate growth/enlargement versus infection.

## 2018-09-13 NOTE — Telephone Encounter (Signed)
Patient advised and agrees to referral.  States he had no preference on who to go see and would go with your recommendation.

## 2018-10-03 ENCOUNTER — Other Ambulatory Visit: Payer: Self-pay | Admitting: Family Medicine

## 2018-10-03 DIAGNOSIS — F5101 Primary insomnia: Secondary | ICD-10-CM

## 2018-10-25 ENCOUNTER — Ambulatory Visit: Payer: BC Managed Care – PPO | Admitting: Urology

## 2018-10-25 ENCOUNTER — Other Ambulatory Visit: Payer: Self-pay

## 2018-10-25 ENCOUNTER — Encounter: Payer: Self-pay | Admitting: Urology

## 2018-10-25 VITALS — BP 128/79 | HR 57 | Ht 70.0 in | Wt 170.0 lb

## 2018-10-25 DIAGNOSIS — R972 Elevated prostate specific antigen [PSA]: Secondary | ICD-10-CM | POA: Diagnosis not present

## 2018-10-25 DIAGNOSIS — R6889 Other general symptoms and signs: Secondary | ICD-10-CM

## 2018-10-25 NOTE — Progress Notes (Signed)
10/25/2018 9:37 AM   Dale Gray 1955/10/28 [id number]  Referring provider: Margo Common, [place],  [place] [address]  Chief Complaint  Patient presents with  . Elevated PSA    HPI: 63 year old male with history of elevated/rising PSA who presents today for further evaluation of this.  Patient's PSA has risen up to 7.3 steadily since 2017.  PSA trend as below.  He denies a family history of prostate cancer.  Urinary symptoms are minimal at baseline.  No weight loss or bone pain.  He takes it daily aspirin for prophylaxis, no history of stroke or heart attack.  Not currently taking meloxicam.  PSA trend: 3.4 02/2016 6.5 03/2018 7.3 09/2018   PMH: Past Medical History:  Diagnosis Date  . GERD (gastroesophageal reflux disease)   . Hemorrhoid   . Hyperlipidemia   . Joint pain   . Reflux     Surgical History: Past Surgical History:  Procedure Laterality Date  . ANAL FISSURE REPAIR  2014  . COLONOSCOPY WITH PROPOFOL N/A 03/19/2017   Procedure: COLONOSCOPY WITH PROPOFOL;  Surgeon: Virgel Manifold, MD;  Location: [facility] ENDOSCOPY;  Service: Endoscopy;  Laterality: N/A;  . TYMPANOPLASTY Left 04/30/2014  . VASECTOMY  01/06/2002    Home Medications:  Allergies as of 10/25/2018      Reactions   Lovastatin    Sleep disturbance and extremity pains      Medication List       Accurate as of October 25, 2018 11:59 PM. If you have any questions, ask your nurse or doctor.        STOP taking these medications   meloxicam 15 MG tablet Commonly known as: MOBIC Stopped by: [name], MD     TAKE these medications   aspirin 81 MG tablet Take 81 mg by mouth daily.   ezetimibe 10 MG tablet Commonly known as: ZETIA TAKE 1 TABLET(10 MG) BY MOUTH DAILY   fish oil-omega-3 fatty acids 1000 MG capsule Take 2 g by mouth daily.   omeprazole 40 MG capsule Commonly known as: PRILOSEC TAKE (1) CAPSULE BY MOUTH ONCE DAILY.    pregabalin 50 MG capsule Commonly known as: Lyrica Take 1 capsule (50 mg total) by mouth 3 (three) times daily. TAKE (1) CAPSULE BY MOUTH THREE TIMES DAILY   zolpidem 12.5 MG CR tablet Commonly known as: AMBIEN CR TAKE 1 TABLET BY MOUTH EVERY NIGHT AT BEDTIME AS NEEDED       Allergies:  Allergies  Allergen Reactions  . Lovastatin     Sleep disturbance and extremity pains    Family History: Family History  Problem Relation Age of Onset  . Heart disease Mother   . Asthma Mother   . Heart disease Father   . CAD Father   . Healthy Sister   . Healthy Brother   . Healthy Daughter   . Healthy Daughter   . Diabetes Maternal Aunt   . Diabetes Maternal Uncle   . Colon cancer Neg Hx     Social History:  reports that he has never smoked. He has never used smokeless tobacco. He reports that he does not drink alcohol or use drugs.  ROS: UROLOGY Frequent Urination?: No Hard to postpone urination?: No Burning/pain with urination?: No Get up at night to urinate?: No Leakage of urine?: No Urine stream starts and stops?: Yes Trouble starting stream?: No Do you have to strain to urinate?: No Blood in urine?: No Urinary tract infection?: No Sexually  transmitted disease?: No Injury to kidneys or bladder?: No Painful intercourse?: No Weak stream?: Yes Erection problems?: No Penile pain?: No  Gastrointestinal Nausea?: No Vomiting?: No Indigestion/heartburn?: No Diarrhea?: No Constipation?: No  Constitutional Fever: No Night sweats?: No Weight loss?: No Fatigue?: No  Skin Skin rash/lesions?: No Itching?: No  Eyes Blurred vision?: No Double vision?: No  Ears/Nose/Throat Sore throat?: No Sinus problems?: No  Hematologic/Lymphatic Swollen glands?: No Easy bruising?: No  Cardiovascular Leg swelling?: No Chest pain?: No  Respiratory Cough?: No Shortness of breath?: No  Endocrine Excessive thirst?: No  Musculoskeletal Back pain?: No Joint pain?: No   Neurological Headaches?: No Dizziness?: No  Psychologic Depression?: No Anxiety?: No  Physical Exam: BP 128/79 (BP Location: Left Arm, Patient Position: Sitting, Cuff Size: Normal)   Pulse (!) 57   Ht 5\' 10"  (1.778 m)   Wt 170 lb (77.1 kg)   BMI 24.39 kg/m   Constitutional:  Alert and oriented, No acute distress. HEENT: [place] AT, moist mucus membranes.  Trachea midline, no masses. Cardiovascular: No clubbing, cyanosis, or edema. Respiratory: Normal respiratory effort, no increased work of breathing. GI: Abdomen is soft, nontender, nondistended, no abdominal masses Rectal: Normal sphincter tone.  50 cc prostate which is indurated and firm on the left without a discrete nodule. Skin: No rashes, bruises or suspicious lesions. Neurologic: Grossly intact, no focal deficits, moving all 4 extremities. Psychiatric: Normal mood and affect.  Laboratory Data: Lab Results  Component Value Date   WBC 8.2 09/12/2018   HGB 14.7 09/12/2018   HCT 45.1 09/12/2018   MCV 91 09/12/2018   PLT 348 09/12/2018    Lab Results  Component Value Date   CREATININE 1.13 09/12/2018    Pertinent Imaging: n/a Assessment & Plan:    1. Elevated/ rising PSA  We reviewed the implications of an elevated PSA and the uncertainty surrounding it. In general, a man's PSA increases with age and is produced by both normal and cancerous prostate tissue. Differential for elevated PSA is BPH, prostate cancer, infection, recent intercourse/ejaculation, prostate infarction, recent urethroscopic manipulation (foley placement/cystoscopy) and prostatitis. Management of an elevated PSA can include observation or prostate biopsy and wediscussed this in detail. We discussed that indications for prostate biopsy are defined by age and race specific PSA cutoffs as well as a PSA velocity of 0.75/year.  We repeated the PSA to ensure that this is in fact a rising trend.  Given his abnormal rectal exam today as well as PSA  velocity/trend, and highly concern for prostate cancer.  Strongly recommend prostate biopsy.  We discussed prostate biopsy in detail including the procedure itself, the risks of blood in the urine, stool, and ejaculate, serious infection, and discomfort. He is willing to proceed with this as discussed. - PSA  2. Abnormal digital rectal exam As above   Schedule prostate biopsy  [name], MD  [facility] [address] [place], [place] [address] [phone]

## 2018-10-26 ENCOUNTER — Telehealth: Payer: Self-pay

## 2018-10-26 LAB — PSA: Prostate Specific Ag, Serum: 7.3 ng/mL — ABNORMAL HIGH (ref 0.0–4.0)

## 2018-10-26 NOTE — Telephone Encounter (Signed)
-----   Message from [name], MD sent at 10/26/2018  8:27 AM EDT ----- PSA is stably elevated at 7.3.  This is above where it should be for your age group.  This is certainly above where it should be for your age.  I was hoping that would trend back downwards.  My strongest recommendation at this point would be to pursue prostate biopsy.  This will give [place] the most definitive answer.  Please review the instructions, schedule prostate biopsy if he is willing.  If not, please let me know.   [name], MD

## 2018-10-27 NOTE — Telephone Encounter (Signed)
Patient called and asked about results I gave him his results over the phone and scheduled his [place] over the instructions with him and will mail the app and instructions. Advised patient to call me back if he had any questions.   Sharyn Lull

## 2018-11-03 ENCOUNTER — Other Ambulatory Visit: Payer: Self-pay | Admitting: Family Medicine

## 2018-11-03 DIAGNOSIS — F5101 Primary insomnia: Secondary | ICD-10-CM

## 2018-11-17 ENCOUNTER — Ambulatory Visit: Payer: BC Managed Care – PPO | Admitting: Urology

## 2018-11-17 ENCOUNTER — Other Ambulatory Visit: Payer: Self-pay | Admitting: Urology

## 2018-11-17 ENCOUNTER — Other Ambulatory Visit: Payer: Self-pay

## 2018-11-17 ENCOUNTER — Encounter: Payer: Self-pay | Admitting: Urology

## 2018-11-17 VITALS — BP 158/84 | HR 56 | Ht 70.0 in | Wt 166.0 lb

## 2018-11-17 DIAGNOSIS — R972 Elevated prostate specific antigen [PSA]: Secondary | ICD-10-CM

## 2018-11-17 DIAGNOSIS — C61 Malignant neoplasm of prostate: Secondary | ICD-10-CM | POA: Diagnosis not present

## 2018-11-17 DIAGNOSIS — N4231 Prostatic intraepithelial neoplasia: Secondary | ICD-10-CM | POA: Diagnosis not present

## 2018-11-17 DIAGNOSIS — N4232 Atypical small acinar proliferation of prostate: Secondary | ICD-10-CM | POA: Diagnosis not present

## 2018-11-17 MED ORDER — GENTAMICIN SULFATE 40 MG/ML IJ SOLN
80.0000 mg | Freq: Once | INTRAMUSCULAR | Status: AC
  Administered 2018-11-17: 80 mg via INTRAMUSCULAR

## 2018-11-17 MED ORDER — LEVOFLOXACIN 500 MG PO TABS
500.0000 mg | ORAL_TABLET | Freq: Once | ORAL | Status: AC
  Administered 2018-11-17: 500 mg via ORAL

## 2018-11-17 NOTE — Progress Notes (Signed)
   11/17/18  CC:  Chief Complaint  Patient presents with  . Prostate Biopsy    HPI: 63 year old male with elevated rising PSA, abnormal rectal exam with induration on the left concern for prostate cancer presents today for prostate biopsy.  Blood pressure (!) 158/84, pulse (!) 56, height 5\' 10"  (1.778 m), weight 166 lb (75.3 kg). NED. A&Ox3.   No respiratory distress   Abd soft, NT, ND Normal sphincter tone  Prostate Biopsy Procedure   Informed consent was obtained after discussing risks/benefits of the procedure.  A time out was performed to ensure correct patient identity.  Pre-Procedure:- Gentamicin given prophylactically - Levaquin 500 mg administered PO -Transrectal Ultrasound performed revealing a 51.8 gm prostate -Asymmetric massively dilated seminal vesicles, left greater than right.  Irregular left prostate with ill-defined border, multiple hypoechoic lesions.  No median lobe.  Procedure: - Prostate block performed using 10 cc 1% lidocaine and biopsies taken from sextant areas, a total of 12 under ultrasound guidance. -A second left base was taken with attention to the seminal vesicles which is included with the specimen.  Post-Procedure: - Patient tolerated the procedure well - He was counseled to seek immediate medical attention if experiences any severe pain, significant bleeding, or fevers - Return in two   [name], MD

## 2018-11-29 ENCOUNTER — Telehealth: Payer: Self-pay | Admitting: *Deleted

## 2018-11-29 NOTE — Telephone Encounter (Signed)
Left a VM to r/s appt BX results not back yet

## 2018-11-30 ENCOUNTER — Other Ambulatory Visit: Payer: Self-pay | Admitting: Family Medicine

## 2018-11-30 ENCOUNTER — Other Ambulatory Visit: Payer: Self-pay | Admitting: Urology

## 2018-11-30 ENCOUNTER — Ambulatory Visit: Payer: BC Managed Care – PPO | Admitting: Urology

## 2018-11-30 DIAGNOSIS — F5101 Primary insomnia: Secondary | ICD-10-CM

## 2018-11-30 LAB — ANATOMIC PATHOLOGY REPORT: PDF Image: 0

## 2018-12-06 ENCOUNTER — Telehealth (INDEPENDENT_AMBULATORY_CARE_PROVIDER_SITE_OTHER): Payer: BC Managed Care – PPO | Admitting: Urology

## 2018-12-06 ENCOUNTER — Other Ambulatory Visit: Payer: Self-pay

## 2018-12-06 DIAGNOSIS — C61 Malignant neoplasm of prostate: Secondary | ICD-10-CM | POA: Diagnosis not present

## 2018-12-06 NOTE — Progress Notes (Signed)
Virtual Visit via Video Note  I connected with Dale Gray on 12/06/18 at  8:30 AM EDT by a video enabled telemedicine application and verified th left at I am speaking with the correct person using two identifiers.  Location: Patient: home with wife Provider: office   I discussed the limitations of evaluation and management by telemedicine and the availability of in person appointments. The patient expressed understanding and agreed to proceed.  History of Present Illness: 63 year old male who presents today via virtual visit per the patient's request reviewed his prostate biopsy results.  He presented with a rising PSA up to 7.3 highly concerning for prostate cancer.  He had did have induration on rectal exam on the left side.  On transrectal ultrasound, he had an abnormal exam with a significantly dilated seminal vesicles, left greater than right with ill-defined lateral border of his prostate which was highly concerning with hypoechoic lesions.  At the time of biopsy, I did take 2 biopsies from the left bases this was most suspicious.  I attempted to biopsy the seminal vesicle specifically.  Biopsy results indicate 1 of 12 cores positive for malignancy, Gleason 3+3 at the left base involving 100% of the tissue.  Per the report, there is no evidence of seminal vesicle tissue.  There is also a suspicious area at the left lateral base as well but not diagnostic for prostate cancer.  He is done well following the biopsy.  He is no complaints today.   Observations/Objective: Pleasant, appears by video accompanied by his wife  Assessment and Plan:  1. Prostate cancer [facility]) The patient was counseled about the natural history of prostate cancer and the standard treatment options that are available for prostate cancer. It was explained to him how his age and life expectancy, clinical stage, Gleason score, and PSA affect his prognosis, the decision to proceed with additional staging  studies, as well as how that information influences recommended treatment strategies. We discussed the roles for active surveillance, radiation therapy, surgical therapy, androgen deprivation, as well as ablative therapy options for the treatment of prostate cancer as appropriate to his individual cancer situation. We discussed the risks and benefits of these options with regard to their impact on cancer control and also in terms of potential adverse events, complications, and impact on quality of life particularly related to urinary, bowel, and sexual function. The patient was encouraged to ask questions throughout the discussion today and all questions were answered to his stated satisfaction. In addition, the patient was providedwith and/or directed to appropriate resources and literature for further education about prostate cancer treatment options.  In general, with low risk prostate cancer, surveillance would be the recommended option.  That being said, we discussed today that I am highly concerned about other clinical findings including abnormal rectal exam, transrectal ultrasound findings with abnormally dilated and ill-defined left lateral prostate border.  Additionally, biopsy results are somewhat unusual that 100% of a single core was involved in no other tissue.  Clinically, these findings and constellation are inconsistent.  I am concerned there may have been a sampling error which I expressed today.  I will strongly recommended a prostate MRI for further anatomic evaluation of his prostate.  We discussed that if there is additional pathology or suspicious areas on MR, I would likely recommend a fusion biopsy to target these areas specifically and ensure that in fact his biopsy is representative of this disease state.  He is agreeable this plan.  All questions were  answered today.  He was also encouraged to stop by our office and pick up a book on prostate cancer.  Follow Up  Instructions: -Prostate MRI with and without contrast ordered -Follow-up with me after the study to discuss options   I discussed the assessment and treatment plan with the patient. The patient was provided an opportunity to ask questions and all were answered. The patient agreed with the plan and demonstrated an understanding of the instructions.   The patient was advised to call back or seek an in-person evaluation if the symptoms worsen or if the condition fails to improve as anticipated.  I provided 16 minutes of non-face-to-face time during this encounter.   [name], MD

## 2018-12-22 ENCOUNTER — Telehealth: Payer: Self-pay | Admitting: Urology

## 2018-12-22 NOTE — Telephone Encounter (Signed)
Dale Gray from [facility] called and states that she feels like the order should be changed from MRI abd to Prostate.  Ph # [phone]

## 2018-12-22 NOTE — Telephone Encounter (Signed)
Spoke w/ Denton Ar and notified her that the MRI should be prostate and the order was changed in the system she will contact the patient and change in the system

## 2018-12-22 NOTE — Addendum Note (Signed)
Addended by: Tommy Rainwater on: 12/22/2018 10:23 AM   Modules accepted: Orders

## 2018-12-24 ENCOUNTER — Inpatient Hospital Stay: Admission: RE | Admit: 2018-12-24 | Payer: BLUE CROSS/BLUE SHIELD | Source: Ambulatory Visit

## 2018-12-25 ENCOUNTER — Ambulatory Visit
Admission: RE | Admit: 2018-12-25 | Discharge: 2018-12-25 | Disposition: A | Discharge status: Home / Self Care | Payer: BC Managed Care – PPO | Source: Ambulatory Visit | Attending: Urology | Admitting: Urology

## 2018-12-25 ENCOUNTER — Other Ambulatory Visit: Payer: Self-pay

## 2018-12-25 DIAGNOSIS — C61 Malignant neoplasm of prostate: Secondary | ICD-10-CM

## 2018-12-25 DIAGNOSIS — N5089 Other specified disorders of the male genital organs: Secondary | ICD-10-CM | POA: Diagnosis not present

## 2018-12-25 MED ORDER — GADOBENATE DIMEGLUMINE 529 MG/ML IV SOLN
16.0000 mL | Freq: Once | INTRAVENOUS | Status: AC | PRN
  Administered 2018-12-25: 16 mL via INTRAVENOUS

## 2018-12-26 ENCOUNTER — Telehealth (INDEPENDENT_AMBULATORY_CARE_PROVIDER_SITE_OTHER): Payer: BC Managed Care – PPO | Admitting: Urology

## 2018-12-26 DIAGNOSIS — C61 Malignant neoplasm of prostate: Secondary | ICD-10-CM

## 2018-12-26 NOTE — Telephone Encounter (Signed)
Called patient to review prostate MRI results.  Due to presence of significant hemorrhage related to his recent prostate biopsy, the quality interpretation of the images was less desirable.  He does have markedly dilated seminal vesicles which were interpreted as related to biopsy change, however these were dilated at the time of biopsy and likely unrelated to the biopsy itself.  As per radiology recommendations, we will plan to repeat this in 3 months.  The patient is agreeable this plan.  We will have a follow-up thereafter with results.  All questions answered.  [name], MD

## 2019-01-03 ENCOUNTER — Other Ambulatory Visit: Payer: Self-pay | Admitting: Family Medicine

## 2019-01-03 ENCOUNTER — Ambulatory Visit (INDEPENDENT_AMBULATORY_CARE_PROVIDER_SITE_OTHER): Payer: BC Managed Care – PPO | Admitting: Family Medicine

## 2019-01-03 ENCOUNTER — Other Ambulatory Visit: Payer: Self-pay

## 2019-01-03 DIAGNOSIS — F5101 Primary insomnia: Secondary | ICD-10-CM

## 2019-01-03 DIAGNOSIS — Z23 Encounter for immunization: Secondary | ICD-10-CM | POA: Diagnosis not present

## 2019-01-03 MED ORDER — ZOLPIDEM TARTRATE ER 12.5 MG PO TBCR: 12.5000 mg | EXTENDED_RELEASE_TABLET | Freq: Every evening | ORAL | 0 refills | Status: DC | PRN

## 2019-01-04 ENCOUNTER — Ambulatory Visit: Payer: BC Managed Care – PPO | Admitting: Family Medicine

## 2019-01-30 ENCOUNTER — Other Ambulatory Visit: Payer: Self-pay | Admitting: Family Medicine

## 2019-01-30 DIAGNOSIS — F5101 Primary insomnia: Secondary | ICD-10-CM

## 2019-01-30 DIAGNOSIS — G629 Polyneuropathy, unspecified: Secondary | ICD-10-CM

## 2019-01-30 MED ORDER — PREGABALIN 50 MG PO CAPS: 50.0000 mg | ORAL_CAPSULE | Freq: Three times a day (TID) | ORAL | 3 refills | Status: DC

## 2019-01-30 NOTE — Telephone Encounter (Signed)
This medication is not delegated

## 2019-01-30 NOTE — Telephone Encounter (Signed)
Patient needs refills on Lyrica 50 mg. sent to [facility]

## 2019-02-13 ENCOUNTER — Other Ambulatory Visit: Payer: Self-pay | Admitting: Family Medicine

## 2019-03-07 ENCOUNTER — Other Ambulatory Visit: Payer: Self-pay | Admitting: Family Medicine

## 2019-03-07 DIAGNOSIS — F5101 Primary insomnia: Secondary | ICD-10-CM

## 2019-03-07 NOTE — Telephone Encounter (Signed)
Patient of Dennis 

## 2019-03-07 NOTE — Telephone Encounter (Signed)
Requested medication (s) are due for refill today:yes  Requested medication (s) are on the active medication list: yes  Last refill:  01/30/2019  Future visit scheduled: yes  Notes to clinic:  This refill cannot be delegated    Requested Prescriptions  Pending Prescriptions Disp Refills   zolpidem (AMBIEN CR) 12.5 MG CR tablet [Pharmacy Med Name: ZOLPIDEM ER 12.5MG  TABLETS] 30 tablet     Sig: TAKE 1 TABLET BY MOUTH AT BEDTIME AS NEEDED      Not Delegated - Psychiatry:  Anxiolytics/Hypnotics Failed - 03/07/2019  3:41 AM      Failed - This refill cannot be delegated      Failed - Urine Drug Screen completed in last 360 days.      Passed - Valid encounter within last 6 months    Recent Outpatient Visits           5 months ago Elevated PSA   [place], [place]   12 months ago Annual physical exam   [place], [place]   1 year ago Muscle spasm   [place], Clearnce Sorrel, [place]   1 year ago Paresthesias in right hand   [place], Vickki Muff, [place]   2 years ago Annual physical exam   [place], [place]       Future Appointments             In 4 weeks [name], MD [place]

## 2019-03-13 ENCOUNTER — Encounter: Payer: BC Managed Care – PPO | Admitting: Family Medicine

## 2019-03-27 ENCOUNTER — Encounter: Payer: BC Managed Care – PPO | Admitting: Family Medicine

## 2019-03-28 ENCOUNTER — Ambulatory Visit
Admission: RE | Admit: 2019-03-28 | Discharge: 2019-03-28 | Disposition: A | Discharge status: Home / Self Care | Payer: BC Managed Care – PPO | Source: Ambulatory Visit | Attending: Urology | Admitting: Urology

## 2019-03-28 ENCOUNTER — Other Ambulatory Visit: Payer: Self-pay

## 2019-03-28 DIAGNOSIS — C61 Malignant neoplasm of prostate: Secondary | ICD-10-CM

## 2019-03-28 MED ORDER — GADOBENATE DIMEGLUMINE 529 MG/ML IV SOLN
15.0000 mL | Freq: Once | INTRAVENOUS | Status: AC | PRN
  Administered 2019-03-28: 15 mL via INTRAVENOUS

## 2019-04-04 ENCOUNTER — Encounter: Payer: Self-pay | Admitting: Urology

## 2019-04-04 ENCOUNTER — Other Ambulatory Visit: Payer: Self-pay

## 2019-04-04 ENCOUNTER — Ambulatory Visit (INDEPENDENT_AMBULATORY_CARE_PROVIDER_SITE_OTHER): Payer: BC Managed Care – PPO | Admitting: Urology

## 2019-04-04 VITALS — BP 145/76 | HR 58 | Ht 70.0 in | Wt 175.0 lb

## 2019-04-04 DIAGNOSIS — R350 Frequency of micturition: Secondary | ICD-10-CM | POA: Diagnosis not present

## 2019-04-04 DIAGNOSIS — C61 Malignant neoplasm of prostate: Secondary | ICD-10-CM

## 2019-04-04 NOTE — Progress Notes (Signed)
04/04/2019 1:36 PM   Dale Gray [birth year] [id number]  Referring provider: Margo Common, [place],  [place] [address]  Chief Complaint  Patient presents with  . Elevated PSA    70mo follow up    HPI: 64 year old male with low risk prostate cancer on active surveillance he returns with follow-up MRI.  He presented with a rising PSA up to 7.3 highly concerning for prostate cancer.  He had did have induration on rectal exam on the left side.  On transrectal ultrasound, he had an abnormal exam with a significantly dilated seminal vesicles, left greater than right with ill-defined lateral border of his prostate which was highly concerning with hypoechoic lesions.  At the time of biopsy 11/2018, I did take 2 biopsies from the left bases this was most suspicious.  I attempted to biopsy the seminal vesicle specifically.  Biopsy results indicate 1 of 12 cores positive for malignancy, Gleason 3+3 at the left base involving 100% of the tissue.  Per the report, there is no evidence of seminal vesicle tissue.  There is also a suspicious area at the left lateral base as well but not diagnostic for prostate cancer.  TRUS vol 51.8.  He underwent MRI shortly after biopsy however this obscured by hemorrhage.  He follows up today with repeat MRI.  This is reassuring with no evidence of macroscopic disease.  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  Kidneys radiographically normal and abdominal ultrasound in 2012 with some fullness of the left renal pelvis.  He does mention today that he has some urinary intermittency.  It comes and goes.  His stream is occasional be weak.  Is not particular bothersome to him at this point time.  He feels like he is able to empty completely.   PMH: Past Medical History:  Diagnosis Date  . GERD (gastroesophageal reflux disease)   .  Hemorrhoid   . Hyperlipidemia   . Joint pain   . Reflux     Surgical History: Past Surgical History:  Procedure Laterality Date  . ANAL FISSURE REPAIR  2014  . COLONOSCOPY WITH PROPOFOL N/A 03/19/2017   Procedure: COLONOSCOPY WITH PROPOFOL;  Surgeon: Virgel Manifold, MD;  Location: [facility] ENDOSCOPY;  Service: Endoscopy;  Laterality: N/A;  . TYMPANOPLASTY Left 04/30/2014  . VASECTOMY  01/06/2002    Home Medications:  Allergies as of 04/04/2019      Reactions   Lovastatin    Sleep disturbance and extremity pains      Medication List       Accurate as of April 04, 2019  1:36 PM. If you have any questions, ask your nurse or doctor.        aspirin 81 MG tablet Take 81 mg by mouth daily.   ezetimibe 10 MG tablet Commonly known as: ZETIA TAKE 1 TABLET(10 MG) BY MOUTH DAILY   fish oil-omega-3 fatty acids 1000 MG capsule Take 2 g by mouth daily.   omeprazole 40 MG capsule Commonly known as: PRILOSEC TAKE 1 CAPSULE BY MOUTH EVERY DAY   pregabalin 50 MG capsule Commonly known as: LYRICA Take 1 capsule (50 mg total) by mouth 3 (three) times daily. TAKE (1) CAPSULE BY MOUTH THREE TIMES DAILY   zolpidem 12.5 MG CR tablet Commonly known as: AMBIEN CR TAKE 1 TABLET BY MOUTH AT BEDTIME AS NEEDED       Allergies:  Allergies  Allergen Reactions  .  Lovastatin     Sleep disturbance and extremity pains    Family History: Family History  Problem Relation Age of Onset  . Heart disease Mother   . Asthma Mother   . Heart disease Father   . CAD Father   . Healthy Sister   . Healthy Brother   . Healthy Daughter   . Healthy Daughter   . Diabetes Maternal Aunt   . Diabetes Maternal Uncle   . Colon cancer Neg Hx     Social History:  reports that he has never smoked. He has never used smokeless tobacco. He reports that he does not drink alcohol or use drugs.  ROS: UROLOGY Frequent Urination?: No Hard to postpone urination?: No Burning/pain with urination?:  No Get up at night to urinate?: No Leakage of urine?: No Urine stream starts and stops?: Yes Trouble starting stream?: No Do you have to strain to urinate?: No Blood in urine?: No Urinary tract infection?: No Sexually transmitted disease?: No Injury to kidneys or bladder?: No Painful intercourse?: No Weak stream?: Yes Erection problems?: No Penile pain?: No  Gastrointestinal Nausea?: No Vomiting?: No Indigestion/heartburn?: No Diarrhea?: No Constipation?: No  Constitutional Fever: No Night sweats?: No Weight loss?: No Fatigue?: No  Skin Skin rash/lesions?: No Itching?: No  Eyes Blurred vision?: No Double vision?: No  Ears/Nose/Throat Sore throat?: No Sinus problems?: No  Hematologic/Lymphatic Swollen glands?: No Easy bruising?: No  Cardiovascular Leg swelling?: No Chest pain?: No  Respiratory Cough?: No Shortness of breath?: No  Endocrine Excessive thirst?: No  Musculoskeletal Back pain?: No Joint pain?: No  Neurological Headaches?: No Dizziness?: No  Psychologic Depression?: No Anxiety?: No  Physical Exam: BP (!) 145/76   Pulse (!) 58   Ht 5\' 10"  (1.778 m)   Wt 175 lb (79.4 kg)   BMI 25.11 kg/m   Constitutional:  Alert and oriented, No acute distress. HEENT: [place] AT, moist mucus membranes.  Trachea midline, no masses. Cardiovascular: No clubbing, cyanosis, or edema. Respiratory: Normal respiratory effort, no increased work of breathing.  Skin: No rashes, bruises or suspicious lesions. Neurologic: Grossly intact, no focal deficits, moving all 4 extremities. Psychiatric: Normal mood and affect.  Laboratory Data: Component     Latest Ref Rng & Units 02/17/2016 03/10/2018 09/12/2018 10/25/2018  Prostate Specific Ag, Serum     0.0 - 4.0 ng/mL 3.4 6.5 (H) 7.3 (H) 7.3 (H)   Pertinent Imaging: CLINICAL DATA:  Low risk prostate cancer, Gleason score 6. Follow-up prostate MR.  EXAM: MR PROSTATE WITHOUT AND WITH  CONTRAST  TECHNIQUE: Multiplanar multisequence MRI images were obtained of the pelvis centered about the prostate. Pre and post contrast images were obtained.  CONTRAST:  6mL MULTIHANCE GADOBENATE DIMEGLUMINE 529 MG/ML IV SOLN  COMPARISON:  MR prostate dated 12/25/2018.  FINDINGS: Prostate: No findings specific for high-grade macroscopic prostate cancer on MRI in this patient with known low-grade tumor within the left base of the peripheral zone.  Generalized low T2 signal predominantly in the left mid gland to apex of the peripheral zone (series 10/image 16), without focal T2 lesion. Mild cortical thinning/scarring in the left base of the peripheral zone (series 10/image 13). No early arterial enhancement. No restricted diffusion/low ADC. PI-RADS 1.  Volume: 3.1 x 4.7 x 4.4 cm (volume = 34 mL)  Transcapsular spread:  Absent.  Seminal vesicle involvement: Marked cystic dilatation of the bilateral seminal vesicles, similar to the prior. Layering protein/hemorrhage in the seminal vesicles bilaterally, left greater than right. No nodular enhancement to  suggest tumor. No central obstructing mass.  Neurovascular bundle involvement: Absent.  Pelvic adenopathy: Absent.  Bone metastasis: Absent.  Other findings: Mild sigmoid diverticulosis. Tiny fat containing left inguinal hernia. Bladder is mildly thick-walled/trabeculated, although underdistended.  IMPRESSION: No findings specific for high-grade macroscopic prostate cancer on MRI in this patient with known low-grade tumor within the left base of the peripheral zone.  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 Suspected superimposed post biopsy hemorrhage, grossly unchanged.  Congenital seminal vesicle cysts can be associated with renal abnormalities, although these were not evident on a prior 2014 abdominal ultrasound.   Electronically Signed   By:  Julian Hy M.D.   On: 03/28/2019 13:17   Assessment & Plan:    1. Prostate cancer ([place]) Low risk prostate cancer on active surveillance  Repeat MRI is reassuring without evidence of any microscopic disease -MRI was personally reviewed as well as reviewed with the patient today  Congenital dilation of the vas deferens not likely related to obstruction from tumor as previously suspected based on his MRI as well as low volume low risk cancer.  Plan to follow the patient for every 4 month basis.  PSA repeated today.  Consider repeat biopsy if PSA begins to rise. - PSA  2. Urinary intermittency Minimal bother at this time, patient does have prostamegaly  If symptoms worsen, consider pharmacotherapy versus surgical intervention.    Return in about 4 months (around 08/02/2019) for PSA/ DRE.  [name], MD  [facility] [address] [place], [place] [address] [phone]

## 2019-04-05 LAB — PSA: Prostate Specific Ag, Serum: 9.1 ng/mL — ABNORMAL HIGH (ref 0.0–4.0)

## 2019-04-07 ENCOUNTER — Telehealth: Payer: Self-pay | Admitting: *Deleted

## 2019-04-07 NOTE — Telephone Encounter (Addendum)
  Patient informed, voiced understanding.    ----- Message from [name], MD sent at 04/07/2019  8:47 AM EST ----- Your PSA is up a little bit but given your recent negative biopsy as well as MRI, I think we just keep an eye on this.  We will have you repeat your PSA as scheduled and continue to following closely.  [name], MD

## 2019-04-14 NOTE — Progress Notes (Deleted)
Patient: Dale Gray, Male    DOB: 1955/03/14, 64 y.o.   MRN: [id number] Visit Date: 04/14/2019  Today's Provider: Vernie Murders, PA   No chief complaint on file.  Subjective:  Dale Gray is a 64 y.o. male who presents today for health maintenance and complete physical. He feels {DESC; WELL/FAIRLY WELL/POORLY:18703}. He reports exercising ***. He reports he is sleeping {DESC; WELL/FAIRLY WELL/POORLY:18703}.  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  Review of Systems  Constitutional: Negative.   HENT: Negative.   Eyes: Negative.   Respiratory: Negative.   Cardiovascular: Negative.   Gastrointestinal: Negative.   Endocrine: Negative.   Genitourinary: Negative.   Musculoskeletal: Negative.   Skin: Negative.   Allergic/Immunologic: Negative.   Neurological: Negative.   Hematological: Negative.   Psychiatric/Behavioral: Negative.     Social History   Socioeconomic History  . Marital status: Married    Spouse name: Not on file  . Number of children: Not on file  . Years of education: Not on file  . Highest education level: Not on file  Occupational History  . Not on file  Tobacco Use  . Smoking status: Never Smoker  . Smokeless tobacco: Never Used  Substance and Sexual Activity  . Alcohol use: No  . Drug use: No  . Sexual activity: Not on file  Other Topics Concern  . Not on file  Social History Narrative  . Not on file   Social Determinants of Health   Financial Resource Strain:   . Difficulty of Paying Living Expenses: Not on file  Food Insecurity:   . Worried About Charity fundraiser in the Last Year: Not on file  . Ran Out of Food in the Last Year: Not on file  Transportation Needs:   . Lack of Transportation (Medical): Not on file  . Lack of Transportation (Non-Medical): Not on file  Physical Activity:   . Days of Exercise per Week: Not on file  . Minutes of Exercise per Session:  Not on file  Stress:   . Feeling of Stress : Not on file  Social Connections:   . Frequency of Communication with Friends and Family: Not on file  . Frequency of Social Gatherings with Friends and Family: Not on file  . Attends Religious Services: Not on file  . Active Member of Clubs or Organizations: Not on file  . Attends Archivist Meetings: Not on file  . Marital Status: Not on file  Intimate Partner Violence:   . Fear of Current or Ex-Partner: Not on file  . Emotionally Abused: Not on file  . Physically Abused: Not on file  . Sexually Abused: Not on file    Patient Active Problem List   Diagnosis Date Noted  . Special screening for malignant neoplasms, colon   . Personal history of colonic polyps   . Benign neoplasm of ascending colon   . Peripheral neuropathy 01/24/2015  . Anal fissure 01/22/2015  . Disorder of coccyx 01/22/2015  . History of digestive disease 01/22/2015  . HLD (hyperlipidemia) 01/22/2015  . Anemia, iron deficiency 01/22/2015  . Yeast dermatitis 06/22/2012  . Thrombosed hemorrhoids   . Fam hx-ischem heart disease 07/20/2007  . Acid reflux 05/26/2006  . Cannot sleep 10/12/2005  . Hypercholesterolemia without hypertriglyceridemia 10/12/2005    Past Surgical History:  Procedure Laterality Date  . ANAL FISSURE REPAIR  2014  . COLONOSCOPY WITH PROPOFOL N/A 03/19/2017  Procedure: COLONOSCOPY WITH PROPOFOL;  Surgeon: Virgel Manifold, MD;  Location: [facility] ENDOSCOPY;  Service: Endoscopy;  Laterality: N/A;  . TYMPANOPLASTY Left 04/30/2014  . VASECTOMY  01/06/2002    His family history includes Asthma in his mother; CAD in his father; Diabetes in his maternal aunt and maternal uncle; Healthy in his brother, daughter, daughter, and sister; Heart disease in his father and mother.     Outpatient Encounter Medications as of 04/17/2019  Medication Sig  . aspirin 81 MG tablet Take 81 mg by mouth daily.  Marland Kitchen ezetimibe (ZETIA) 10 MG tablet TAKE 1  TABLET(10 MG) BY MOUTH DAILY  . fish oil-omega-3 fatty acids 1000 MG capsule Take 2 g by mouth daily.  Marland Kitchen omeprazole (PRILOSEC) 40 MG capsule TAKE 1 CAPSULE BY MOUTH EVERY DAY  . pregabalin (LYRICA) 50 MG capsule Take 1 capsule (50 mg total) by mouth 3 (three) times daily. TAKE (1) CAPSULE BY MOUTH THREE TIMES DAILY  . zolpidem (AMBIEN CR) 12.5 MG CR tablet TAKE 1 TABLET BY MOUTH AT BEDTIME AS NEEDED   No facility-administered encounter medications on file as of 04/17/2019.    Patient Care Team: Chrismon, Vickki Muff, PA as PCP - General (Family Medicine) Bary Castilla Forest Gleason, MD as Consulting Physician (General Surgery)      Objective:   Vitals: There were no vitals filed for this visit.  Physical Exam   Depression Screen PHQ 2/9 Scores 03/10/2018 02/18/2017 02/14/2016  PHQ - 2 Score 0 0 0  PHQ- 9 Score 0 1 -      Assessment & Plan:     Routine Health Maintenance and Physical Exam  Exercise Activities and Dietary recommendations Goals   None     Immunization History  Administered Date(s) Administered  . Influenza,inj,Quad PF,6+ Mos 12/13/2015, 11/26/2016, 01/25/2018, 01/03/2019  . Td 02/18/2017  . Tdap 05/26/2006  . Zoster Recombinat (Shingrix) 03/10/2018, 05/09/2018    Health Maintenance  Topic Date Due  . COLONOSCOPY  03/19/2022  . TETANUS/TDAP  02/19/2027  . INFLUENZA VACCINE  Completed  . Hepatitis C Screening  Completed  . HIV Screening  Completed     Discussed health benefits of physical activity, and encouraged him to engage in regular exercise appropriate for his age and condition.

## 2019-04-17 ENCOUNTER — Ambulatory Visit: Payer: BC Managed Care – PPO | Attending: Internal Medicine

## 2019-04-17 ENCOUNTER — Other Ambulatory Visit: Payer: Self-pay

## 2019-04-17 ENCOUNTER — Encounter: Payer: BC Managed Care – PPO | Admitting: Family Medicine

## 2019-04-17 DIAGNOSIS — Z20822 Contact with and (suspected) exposure to covid-19: Secondary | ICD-10-CM | POA: Diagnosis not present

## 2019-04-18 LAB — NOVEL CORONAVIRUS, NAA: SARS-CoV-2, NAA: DETECTED — AB

## 2019-04-18 NOTE — Progress Notes (Signed)
Your test for COVID-19 was positive ("detected"), meaning that you were infected with the novel coronavirus and could give the germ to others.    Please continue isolation at home, for at least 10 days since the start of your fever/cough/breathlessness and until you have had 24 hours without fever (without taking a fever reducer) and with any cough/breathlessness improving. Use over-the-counter medications for symptoms.  If you have had no symptoms, but were exposed to someone who was positive for COVID-19, you will need to quarantine and self-isolate for 14 days from the date of exposure.    Please continue good preventive care measures, including:  frequent hand-washing, avoid touching your face, cover coughs/sneezes, stay out of crowds and keep a 6 foot distance from others.  Clean hard surfaces touched frequently with disinfectant cleaning products.   Please check in with your primary care provider about your positive test result.  Go to the nearest urgent care or ED for assessment if you have severe breathlessness or severe weakness/fatigue (ex needing new help getting out of bed or to the bathroom).  Members of your household will also need to quarantine for 14 days from the date of your positive test. You may be contacted to discuss possible treatment options, and you may also be contacted by the health department for follow up. Please call [place] at [phone] if you have any questions or concerns.

## 2019-04-21 ENCOUNTER — Other Ambulatory Visit: Payer: Self-pay | Admitting: Family Medicine

## 2019-05-04 ENCOUNTER — Other Ambulatory Visit: Payer: Self-pay | Admitting: Family Medicine

## 2019-05-04 DIAGNOSIS — F5101 Primary insomnia: Secondary | ICD-10-CM

## 2019-05-04 NOTE — Telephone Encounter (Signed)
Pt request refill for ZOLPIDEM ER 12.5MG  TABLETS.  Pt also needs an office visit.

## 2019-05-05 ENCOUNTER — Other Ambulatory Visit: Payer: Self-pay

## 2019-05-05 ENCOUNTER — Ambulatory Visit (INDEPENDENT_AMBULATORY_CARE_PROVIDER_SITE_OTHER): Payer: BC Managed Care – PPO | Admitting: Family Medicine

## 2019-05-05 ENCOUNTER — Encounter: Payer: Self-pay | Admitting: Family Medicine

## 2019-05-05 VITALS — BP 122/73 | HR 59 | Temp 96.8°F | Resp 18 | Ht 70.0 in | Wt 172.0 lb

## 2019-05-05 DIAGNOSIS — F5101 Primary insomnia: Secondary | ICD-10-CM | POA: Diagnosis not present

## 2019-05-05 DIAGNOSIS — K219 Gastro-esophageal reflux disease without esophagitis: Secondary | ICD-10-CM | POA: Diagnosis not present

## 2019-05-05 DIAGNOSIS — Z0000 Encounter for general adult medical examination without abnormal findings: Secondary | ICD-10-CM | POA: Diagnosis not present

## 2019-05-05 DIAGNOSIS — E7800 Pure hypercholesterolemia, unspecified: Secondary | ICD-10-CM

## 2019-05-05 DIAGNOSIS — G629 Polyneuropathy, unspecified: Secondary | ICD-10-CM

## 2019-05-05 DIAGNOSIS — R972 Elevated prostate specific antigen [PSA]: Secondary | ICD-10-CM

## 2019-05-05 NOTE — Telephone Encounter (Signed)
Please note patient has appointment today

## 2019-05-05 NOTE — Progress Notes (Signed)
Patient: Dale Gray, Male    DOB: 1955/08/19, 64 y.o.   MRN: [id number] Visit Date: 05/05/2019  Today's Provider: Dortha Kern, PA   Chief Complaint  Patient presents with  . Annual Exam   Subjective:     Annual physical exam Dale Gray is a 64 y.o. male who presents today for health maintenance and complete physical. He feels well. He reports exercising yes. He reports he is sleeping well with Ambien.  -----------------------------------------------------------  Colonoscopy: 03/19/2017  Review of Systems  All other systems reviewed and are negative.   Social History      He  reports that he has never smoked. He has never used smokeless tobacco. He reports that he does not drink alcohol or use drugs.       Social History   Socioeconomic History  . Marital status: Married    Spouse name: Not on file  . Number of children: Not on file  . Years of education: Not on file  . Highest education level: Not on file  Occupational History  . Not on file  Tobacco Use  . Smoking status: Never Smoker  . Smokeless tobacco: Never Used  Substance and Sexual Activity  . Alcohol use: No  . Drug use: No  . Sexual activity: Not on file  Other Topics Concern  . Not on file  Social History Narrative  . Not on file   Social Determinants of Health   Financial Resource Strain:   . Difficulty of Paying Living Expenses: Not on file  Food Insecurity:   . Worried About Programme researcher, broadcasting/film/video in the Last Year: Not on file  . Ran Out of Food in the Last Year: Not on file  Transportation Needs:   . Lack of Transportation (Medical): Not on file  . Lack of Transportation (Non-Medical): Not on file  Physical Activity:   . Days of Exercise per Week: Not on file  . Minutes of Exercise per Session: Not on file  Stress:   . Feeling of Stress : Not on file  Social Connections:   . Frequency of Communication with Friends and Family: Not on file  . Frequency of Social  Gatherings with Friends and Family: Not on file  . Attends Religious Services: Not on file  . Active Member of Clubs or Organizations: Not on file  . Attends Banker Meetings: Not on file  . Marital Status: Not on file    Past Medical History:  Diagnosis Date  . GERD (gastroesophageal reflux disease)   . Hemorrhoid   . Hyperlipidemia   . Joint pain   . Reflux      Patient Active Problem List   Diagnosis Date Noted  . Special screening for malignant neoplasms, colon   . Personal history of colonic polyps   . Benign neoplasm of ascending colon   . Peripheral neuropathy 01/24/2015  . Anal fissure 01/22/2015  . Disorder of coccyx 01/22/2015  . History of digestive disease 01/22/2015  . HLD (hyperlipidemia) 01/22/2015  . Anemia, iron deficiency 01/22/2015  . Yeast dermatitis 06/22/2012  . Thrombosed hemorrhoids   . Fam hx-ischem heart disease 07/20/2007  . Acid reflux 05/26/2006  . Cannot sleep 10/12/2005  . Hypercholesterolemia without hypertriglyceridemia 10/12/2005    Past Surgical History:  Procedure Laterality Date  . ANAL FISSURE REPAIR  2014  . COLONOSCOPY WITH PROPOFOL N/A 03/19/2017   Procedure: COLONOSCOPY WITH PROPOFOL;  Surgeon: Pasty Spillers, MD;  Location: [facility] ENDOSCOPY;  Service: Endoscopy;  Laterality: N/A;  . TYMPANOPLASTY Left 04/30/2014  . VASECTOMY  01/06/2002    Family History        Family Status  Relation Name Status  . Mother  Alive  . Father  Alive  . Sister  Alive  . Brother  Alive  . Daughter  Alive  . Daughter  Alive  . Mat Aunt  (Not Specified)  . Mat Uncle  (Not Specified)  . Neg Hx  (Not Specified)        His family history includes Asthma in his mother; CAD in his father; Diabetes in his maternal aunt and maternal uncle; Healthy in his brother, daughter, daughter, and sister; Heart disease in his father and mother. There is no history of Colon cancer.      Allergies  Allergen Reactions  . Lovastatin      Sleep disturbance and extremity pains    Current Outpatient Medications:  .  aspirin 81 MG tablet, Take 81 mg by mouth daily., Disp: , Rfl:  .  ezetimibe (ZETIA) 10 MG tablet, TAKE 1 TABLET BY MOUTH EVERY DAY, Disp: 30 tablet, Rfl: 0 .  fish oil-omega-3 fatty acids 1000 MG capsule, Take 2 g by mouth daily., Disp: , Rfl:  .  omeprazole (PRILOSEC) 40 MG capsule, TAKE 1 CAPSULE BY MOUTH EVERY DAY, Disp: 90 capsule, Rfl: 3 .  pregabalin (LYRICA) 50 MG capsule, Take 1 capsule (50 mg total) by mouth 3 (three) times daily. TAKE (1) CAPSULE BY MOUTH THREE TIMES DAILY, Disp: 90 capsule, Rfl: 3 .  zolpidem (AMBIEN CR) 12.5 MG CR tablet, TAKE 1 TABLET BY MOUTH AT BEDTIME AS NEEDED, Disp: 30 tablet, Rfl: 0   Patient Care Team: Lezley Bedgood, Jodell Cipro, PA as PCP - General (Family Medicine) [name], MD as Consulting Physician (General Surgery)    Objective:    Vitals: BP 122/73 (BP Location: Right Arm, Patient Position: Sitting, Cuff Size: Large)   Pulse (!) 59   Temp (!) 96.8 F (36 C) (Other (Comment))   Resp 18   Ht 5\' 10"  (1.778 m)   Wt 172 lb (78 kg)   SpO2 97%   BMI 24.68 kg/m    Vitals:   05/05/19 0903  BP: 122/73  Pulse: (!) 59  Resp: 18  Temp: (!) 96.8 F (36 C)  TempSrc: Other (Comment)  SpO2: 97%  Weight: 172 lb (78 kg)  Height: 5\' 10"  (1.778 m)    Physical Exam Constitutional:      Appearance: He is well-developed.  HENT:     Head: Normocephalic and atraumatic.     Right Ear: External ear normal.     Left Ear: External ear normal.     Nose: Nose normal.  Eyes:     General:        Right eye: No discharge.     Conjunctiva/sclera: Conjunctivae normal.     Pupils: Pupils are equal, round, and reactive to light.  Neck:     Thyroid: No thyromegaly.     Trachea: No tracheal deviation.  Cardiovascular:     Rate and Rhythm: Normal rate and regular rhythm.     Heart sounds: Normal heart sounds. No murmur.  Pulmonary:     Effort: Pulmonary effort is normal. No  respiratory distress.     Breath sounds: Normal breath sounds. No wheezing or rales.  Chest:     Chest wall: No tenderness.  Abdominal:     General: There  is no distension.     Palpations: Abdomen is soft. There is no mass.     Tenderness: There is no abdominal tenderness. There is no guarding or rebound.  Genitourinary:    Comments: Deferred to urology follow up in a month. Musculoskeletal:        General: No tenderness. Normal range of motion.     Cervical back: Normal range of motion and neck supple.  Lymphadenopathy:     Cervical: No cervical adenopathy.  Skin:    General: Skin is warm and dry.     Findings: No erythema or rash.  Neurological:     Mental Status: He is alert and oriented to person, place, and time.     Cranial Nerves: No cranial nerve deficit.     Motor: No abnormal muscle tone.     Coordination: Coordination normal.     Deep Tendon Reflexes: Reflexes are normal and symmetric. Reflexes normal.     Comments: Tingling and pins & needles sensation in the arch of both feet. Normal ROM and no vascular deficits.  Psychiatric:        Behavior: Behavior normal.        Thought Content: Thought content normal.        Judgment: Judgment normal.     Depression Screen PHQ 2/9 Scores 05/05/2019 03/10/2018 02/18/2017 02/14/2016  PHQ - 2 Score 0 0 0 0  PHQ- 9 Score 1 0 1 -      Assessment & Plan:     Routine Health Maintenance and Physical Exam  Exercise Activities and Dietary recommendations Goals   None     Immunization History  Administered Date(s) Administered  . Influenza,inj,Quad PF,6+ Mos 12/13/2015, 11/26/2016, 01/25/2018, 01/03/2019  . Td 02/18/2017  . Tdap 05/26/2006  . Zoster Recombinat (Shingrix) 03/10/2018, 05/09/2018    Health Maintenance  Topic Date Due  . COLONOSCOPY  03/19/2022  . TETANUS/TDAP  02/19/2027  . INFLUENZA VACCINE  Completed  . Hepatitis C Screening  Completed  . HIV Screening  Completed     Discussed health benefits of  physical activity, and encouraged him to engage in regular exercise appropriate for his age and condition.    --------------------------------------------------------------------  1. Annual physical exam General health good. Had COVID infection with positive test 04-17-19. Energy level back to normal and he is back at regular work activities now. Sense of taste is slowly returning. Immunizations up to date. Encouraged to consider COVID vaccination in 90 days. Last colonoscopy in 2019 negative for malignancy in 2 polyps. Recheck routine labs. - CBC with Differential/Platelet - Comprehensive metabolic panel - Lipid panel - TSH  2. Hypercholesterolemia without hypertriglyceridemia Tolerating Zetic and Omega-3 daily. Attempt to use statin in the past caused flare of neuropathy and sleep disturbance with muscle and joint pains. Recheck labs and continue low fat diet. - Comprehensive metabolic panel - Lipid panel  3. Primary insomnia Sleeping better with use of Lyrica for peripheral neuropathy and Ambien. Without these medications, he does not sleep much and does not feel rested. Check routine labs. - CBC with Differential/Platelet - Comprehensive metabolic panel - TSH  4. Gastroesophageal reflux disease, unspecified whether esophagitis present Frequent heartburn if he does not take the Omeprazole daily. No hematemesis, melena or hematochezia. Recheck routine labs. May need referral to GI if dyspepsia persists. - CBC with Differential/Platelet  5. Peripheral polyneuropathy Years of pain in feet with numbness got a little worse during COVID infection 3 weeks ago. Lyrica helps control pain. Recheck routine  labs. - CBC with Differential/Platelet - Comprehensive metabolic panel - TSH  6. Elevated PSA Followed by Dr. Apolinar Junes (urologist) with last PSA 9.1 on 04-04-19. Denies decreased stream, nocturia or frequency. PSA has continued to climb but only one core out of 12 showed any adenocarcinoma in  2020. Under active surveillance by urologist.   Dortha Kern, PA  [facility]

## 2019-05-08 DIAGNOSIS — K219 Gastro-esophageal reflux disease without esophagitis: Secondary | ICD-10-CM | POA: Diagnosis not present

## 2019-05-08 DIAGNOSIS — F5101 Primary insomnia: Secondary | ICD-10-CM | POA: Diagnosis not present

## 2019-05-08 DIAGNOSIS — G629 Polyneuropathy, unspecified: Secondary | ICD-10-CM | POA: Diagnosis not present

## 2019-05-08 DIAGNOSIS — Z0000 Encounter for general adult medical examination without abnormal findings: Secondary | ICD-10-CM | POA: Diagnosis not present

## 2019-05-08 DIAGNOSIS — E7800 Pure hypercholesterolemia, unspecified: Secondary | ICD-10-CM | POA: Diagnosis not present

## 2019-05-09 LAB — COMPREHENSIVE METABOLIC PANEL
ALT: 15 IU/L (ref 0–44)
AST: 21 IU/L (ref 0–40)
Albumin/Globulin Ratio: 1.8 (ref 1.2–2.2)
Albumin: 4.4 g/dL (ref 3.8–4.8)
Alkaline Phosphatase: 74 IU/L (ref 39–117)
BUN/Creatinine Ratio: 11 (ref 10–24)
BUN: 12 mg/dL (ref 8–27)
Bilirubin Total: 0.7 mg/dL (ref 0.0–1.2)
CO2: 22 mmol/L (ref 20–29)
Calcium: 9.2 mg/dL (ref 8.6–10.2)
Chloride: 103 mmol/L (ref 96–106)
Creatinine, Ser: 1.09 mg/dL (ref 0.76–1.27)
GFR calc Af Amer: 84 mL/min/{1.73_m2} (ref >59)
GFR calc non Af Amer: 73 mL/min/{1.73_m2} (ref >59)
Globulin, Total: 2.5 g/dL (ref 1.5–4.5)
Glucose: 121 mg/dL — ABNORMAL HIGH (ref 65–99)
Potassium: 4.5 mmol/L (ref 3.5–5.2)
Sodium: 141 mmol/L (ref 134–144)
Total Protein: 6.9 g/dL (ref 6.0–8.5)

## 2019-05-09 LAB — CBC WITH DIFFERENTIAL/PLATELET
Basophils Absolute: 0.1 10*3/uL (ref 0.0–0.2)
Basos: 1 %
EOS (ABSOLUTE): 0.1 10*3/uL (ref 0.0–0.4)
Eos: 1 %
Hematocrit: 39.7 % (ref 37.5–51.0)
Hemoglobin: 13.7 g/dL (ref 13.0–17.7)
Immature Grans (Abs): 0 10*3/uL (ref 0.0–0.1)
Immature Granulocytes: 0 %
Lymphocytes Absolute: 1.2 10*3/uL (ref 0.7–3.1)
Lymphs: 21 %
MCH: 31 pg (ref 26.6–33.0)
MCHC: 34.5 g/dL (ref 31.5–35.7)
MCV: 90 fL (ref 79–97)
Monocytes Absolute: 0.6 10*3/uL (ref 0.1–0.9)
Monocytes: 10 %
Neutrophils Absolute: 4 10*3/uL (ref 1.4–7.0)
Neutrophils: 67 %
Platelets: 335 10*3/uL (ref 150–450)
RBC: 4.42 x10E6/uL (ref 4.14–5.80)
RDW: 12.5 % (ref 11.6–15.4)
WBC: 6 10*3/uL (ref 3.4–10.8)

## 2019-05-09 LAB — LIPID PANEL
Chol/HDL Ratio: 4.2 ratio (ref 0.0–5.0)
Cholesterol, Total: 183 mg/dL (ref 100–199)
HDL: 44 mg/dL (ref >39)
LDL Chol Calc (NIH): 123 mg/dL — ABNORMAL HIGH (ref 0–99)
Triglycerides: 89 mg/dL (ref 0–149)
VLDL Cholesterol Cal: 16 mg/dL (ref 5–40)

## 2019-05-09 LAB — TSH: TSH: 1.6 u[IU]/mL (ref 0.450–4.500)

## 2019-05-12 DIAGNOSIS — Z135 Encounter for screening for eye and ear disorders: Secondary | ICD-10-CM | POA: Diagnosis not present

## 2019-05-12 DIAGNOSIS — T1511XA Foreign body in conjunctival sac, right eye, initial encounter: Secondary | ICD-10-CM | POA: Diagnosis not present

## 2019-05-22 ENCOUNTER — Other Ambulatory Visit: Payer: Self-pay | Admitting: Family Medicine

## 2019-05-22 NOTE — Telephone Encounter (Signed)
Requested Prescriptions  Pending Prescriptions Disp Refills  . ezetimibe (ZETIA) 10 MG tablet [Pharmacy Med Name: EZETIMIBE 10MG  TABLETS] 90 tablet 1    Sig: TAKE 1 TABLET BY MOUTH EVERY DAY     Cardiovascular:  Antilipid - Sterol Transport Inhibitors Failed - 05/22/2019  8:18 AM      Failed - LDL in normal range and within 360 days    LDL Cholesterol (Calc)  Date Value Ref Range Status  02/18/2017 130 (H) mg/dL (calc) Final    Comment:    Reference range: <100 . Desirable range <100 mg/dL for primary prevention;   <70 mg/dL for patients with CHD or diabetic patients  with > or = 2 CHD risk factors. Marland Kitchen LDL-C is now calculated using the Martin-Hopkins  calculation, which is a validated novel method providing  better accuracy than the Friedewald equation in the  estimation of LDL-C.  Cresenciano Genre et al. Annamaria Helling. [id number]): 2061-2068  (http://education.[facility].com/faq/FAQ164)    LDL Chol Calc (NIH)  Date Value Ref Range Status  05/08/2019 123 (H) 0 - 99 mg/dL Final         Passed - Total Cholesterol in normal range and within 360 days    Cholesterol, Total  Date Value Ref Range Status  05/08/2019 183 100 - 199 mg/dL Final         Passed - HDL in normal range and within 360 days    HDL  Date Value Ref Range Status  05/08/2019 44 >39 mg/dL Final         Passed - Triglycerides in normal range and within 360 days    Triglycerides  Date Value Ref Range Status  05/08/2019 89 0 - 149 mg/dL Final         Passed - Valid encounter within last 12 months    Recent Outpatient Visits          2 weeks ago Annual physical exam   [facility], [place], [place]   8 months ago Elevated PSA   [facility], [place], [place]   1 year ago Annual physical exam   [place], [place]   1 year ago Muscle spasm   [place], [place], [place]   2 years ago Paresthesias in right hand   [facility], [place], [place]      Future Appointments            In 2 months [name], MD [place]

## 2019-06-05 ENCOUNTER — Other Ambulatory Visit: Payer: Self-pay | Admitting: Family Medicine

## 2019-06-05 DIAGNOSIS — F5101 Primary insomnia: Secondary | ICD-10-CM

## 2019-06-20 ENCOUNTER — Other Ambulatory Visit: Payer: Self-pay | Admitting: Family Medicine

## 2019-06-20 DIAGNOSIS — G629 Polyneuropathy, unspecified: Secondary | ICD-10-CM

## 2019-06-20 NOTE — Telephone Encounter (Signed)
Requested medications are due for refill today?  Yes - Medication cannot be delegated  Requested medications are on active medication list? yes  Last Refill:   01/30/2019   # 90 with 3 refills  Future visit scheduled?  No  Notes to Clinic:  This refill cannot be delegated.

## 2019-07-02 ENCOUNTER — Other Ambulatory Visit: Payer: Self-pay | Admitting: Family Medicine

## 2019-07-02 DIAGNOSIS — F5101 Primary insomnia: Secondary | ICD-10-CM

## 2019-07-03 NOTE — Telephone Encounter (Signed)
Requested medication (s) are due for refill today:  yes  Requested medication (s) are on the active medication list: yes  Last refill:  06/05/19  Future visit scheduled: no  Notes to clinic:  not delegated; no valid encounter within last 6 months    Requested Prescriptions  Pending Prescriptions Disp Refills   zolpidem (AMBIEN CR) 12.5 MG CR tablet [Pharmacy Med Name: ZOLPIDEM ER 12.5MG  TABLETS] 30 tablet     Sig: TAKE 1 TABLET BY MOUTH AT BEDTIME AS NEEDED      Not Delegated - Psychiatry:  Anxiolytics/Hypnotics Failed - 07/02/2019 10:08 PM      Failed - This refill cannot be delegated      Failed - Urine Drug Screen completed in last 360 days.      Failed - Valid encounter within last 6 months    Recent Outpatient Visits           1 month ago Annual physical exam   [place], [place]   9 months ago Elevated PSA   [place], [place]   1 year ago Annual physical exam   [place], [place]   1 year ago Muscle spasm   [place], Clearnce [place]rel, [place]   2 years ago Paresthesias in right hand   [facility], [place], [place]       Future Appointments             In 1 month [name], MD [place]

## 2019-07-25 ENCOUNTER — Other Ambulatory Visit: Payer: Self-pay | Admitting: Family Medicine

## 2019-07-25 DIAGNOSIS — C61 Malignant neoplasm of prostate: Secondary | ICD-10-CM

## 2019-07-31 ENCOUNTER — Other Ambulatory Visit: Payer: Self-pay

## 2019-07-31 ENCOUNTER — Other Ambulatory Visit: Payer: BC Managed Care – PPO

## 2019-07-31 DIAGNOSIS — C61 Malignant neoplasm of prostate: Secondary | ICD-10-CM | POA: Diagnosis not present

## 2019-08-01 LAB — PSA: Prostate Specific Ag, Serum: 9.2 ng/mL — ABNORMAL HIGH (ref 0.0–4.0)

## 2019-08-01 NOTE — Progress Notes (Signed)
08/02/19 12:48 PM   [place] Doran Noseworthy 02-02-1956 [id number]  Referring provider: Margo Common, [address] [place],  [place] [address] Chief Complaint  Patient presents with  . Prostate Cancer    HPI: Dale Gray is a 64 y.o. M with low risk prostate cancer on active surveillance returns today for f/u.   He presented with a rising PSA up to 7.3 highly concerning for prostate cancer. He had did have induration on rectal exam on the left side. On transrectal ultrasound, he had an abnormal exam with a significantly dilated seminal vesicles, left greater than right with ill-defined lateral border of his prostate which was highly concerning with hypoechoic lesions.  At the time of biopsy 11/2018, I did take 2 biopsies from the left bases this was most suspicious. I attempted to biopsy the seminal vesicle specifically.  Biopsy results indicate 1 of 12 cores positive for malignancy, Gleason 3+3 at the left base involving 100% of the tissue. Per the report, there is no evidence of seminal vesicle tissue. There is also a suspicious area at the left lateral base as well but not diagnostic for prostate cancer.  TRUS vol 51.8.  He underwent MRI shortly after biopsy however this obscured by hemorrhage.  Repeat MRI from 03/28/19 was reassuring with no evidence of macroscopic disease.  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  Kidneys radiographically normal and abdominal ultrasound in 2012 with some fullness of the left renal pelvis.  No urinary complaints today.   PSA 9.2 as of 07/31/19. PSA trend below.   Component     Latest Ref Rng & Units 02/17/2016 03/10/2018 09/12/2018 10/25/2018  Prostate Specific Ag, Serum     0.0 - 4.0 ng/mL 3.4 6.5 (H) 7.3 (H) 7.3 (H)   Component     Latest Ref Rng & Units 04/04/2019 07/31/2019  Prostate Specific Ag, Serum     0.0 - 4.0 ng/mL 9.1 (H)  9.2 (H)   PMH: Past Medical History:  Diagnosis Date  . GERD (gastroesophageal reflux disease)   . Hemorrhoid   . Hyperlipidemia   . Joint pain   . Reflux     Surgical History: Past Surgical History:  Procedure Laterality Date  . ANAL FISSURE REPAIR  2014  . COLONOSCOPY WITH PROPOFOL N/A 03/19/2017   Procedure: COLONOSCOPY WITH PROPOFOL;  Surgeon: Virgel Manifold, MD;  Location: [facility] ENDOSCOPY;  Service: Endoscopy;  Laterality: N/A;  . TYMPANOPLASTY Left 04/30/2014  . VASECTOMY  01/06/2002    Home Medications:  Allergies as of 08/02/2019      Reactions   Lovastatin    Sleep disturbance and extremity pains      Medication List       Accurate as of Aug 02, 2019 12:48 PM. If you have any questions, ask your nurse or doctor.        aspirin 81 MG tablet Take 81 mg by mouth daily.   ezetimibe 10 MG tablet Commonly known as: ZETIA TAKE 1 TABLET BY MOUTH EVERY DAY   fish oil-omega-3 fatty acids 1000 MG capsule Take 2 g by mouth daily.   omeprazole 40 MG capsule Commonly known as: PRILOSEC TAKE 1 CAPSULE BY MOUTH EVERY DAY   pregabalin 50 MG capsule Commonly known as: LYRICA TAKE 1 CAPSULE(50 MG) BY MOUTH THREE TIMES DAILY   zolpidem 12.5 MG CR tablet Commonly known as: AMBIEN CR TAKE 1 TABLET BY MOUTH AT BEDTIME  AS NEEDED       Allergies:  Allergies  Allergen Reactions  . Lovastatin     Sleep disturbance and extremity pains    Family History: Family History  Problem Relation Age of Onset  . Heart disease Mother   . Asthma Mother   . Heart disease Father   . CAD Father   . Healthy Sister   . Healthy Brother   . Healthy Daughter   . Healthy Daughter   . Diabetes Maternal Aunt   . Diabetes Maternal Uncle   . Colon cancer Neg Hx     Social History:  reports that he has never smoked. He has never used smokeless tobacco. He reports that he does not drink alcohol or use drugs.   Physical Exam: BP 135/80   Pulse (!) 54   Ht 5\' 10"  (1.778 m)    Wt 170 lb (77.1 kg)   BMI 24.39 kg/m   Constitutional:  Alert and oriented, No acute distress. HEENT: [place] AT, moist mucus membranes.  Trachea midline, no masses. Cardiovascular: No clubbing, cyanosis, or edema. Respiratory: Normal respiratory effort, no increased work of breathing. Rectal: 40 g prostate w/ no nodules or tenderness. Enlarged vasa bilaterally palpable. Skin: No rashes, bruises or suspicious lesions. Neurologic: Grossly intact, no focal deficits, moving all 4 extremities. Psychiatric: Normal mood and affect.  Laboratory Data:  Lab Results  Component Value Date   CREATININE 1.09 05/08/2019   Assessment & Plan:    1. Prostate cancer   Low risk prostate cancer on active surveillance Repeat MRI is reassuring without evidence of any microscopic disease Most recent PSA stable at 9.2 as of 07/31/19  Plan to follow the patient for every 4 month basis.  Consider repeat biopsy if PSA begins to rise for at 1 year following initial dx (9/20)  No follow-ups on file.  [facility] [address], [place], [place] [address] [phone]  I, Lucas Mallow, am acting as a scribe for Dr. [name],  I have reviewed the above documentation for accuracy and completeness, and I agree with the above.   [name], MD

## 2019-08-02 ENCOUNTER — Ambulatory Visit (INDEPENDENT_AMBULATORY_CARE_PROVIDER_SITE_OTHER): Payer: BC Managed Care – PPO | Admitting: Urology

## 2019-08-02 ENCOUNTER — Other Ambulatory Visit: Payer: Self-pay

## 2019-08-02 VITALS — BP 135/80 | HR 54 | Ht 70.0 in | Wt 170.0 lb

## 2019-08-02 DIAGNOSIS — C61 Malignant neoplasm of prostate: Secondary | ICD-10-CM | POA: Diagnosis not present

## 2019-08-03 ENCOUNTER — Other Ambulatory Visit: Payer: Self-pay | Admitting: Family Medicine

## 2019-08-03 DIAGNOSIS — F5101 Primary insomnia: Secondary | ICD-10-CM

## 2019-08-04 NOTE — Telephone Encounter (Signed)
Requested medication (s) are due for refill today: yes  Requested medication (s) are on the active medication list: yes  Last refill:  07/03/19  Future visit scheduled: no  Notes to clinic:  no valid encounter within last 6 months    Requested Prescriptions  Pending Prescriptions Disp Refills   zolpidem (AMBIEN CR) 12.5 MG CR tablet [Pharmacy Med Name: ZOLPIDEM ER 12.5MG  TABLETS] 30 tablet     Sig: TAKE 1 TABLET BY MOUTH AT BEDTIME AS NEEDED      Not Delegated - Psychiatry:  Anxiolytics/Hypnotics Failed - 08/03/2019  9:57 PM      Failed - This refill cannot be delegated      Failed - Urine Drug Screen completed in last 360 days.      Failed - Valid encounter within last 6 months    Recent Outpatient Visits           3 months ago Annual physical exam   [place], [place]   10 months ago Elevated PSA   [place], [place]   1 year ago Annual physical exam   [place], [place]   1 year ago Muscle spasm   [place], Clearnce [place], [place]   2 years ago Paresthesias in right hand   [facility], [place], [place]       Future Appointments             In 4 months [name], MD [place]

## 2019-08-29 ENCOUNTER — Other Ambulatory Visit: Payer: Self-pay

## 2019-08-29 ENCOUNTER — Ambulatory Visit (INDEPENDENT_AMBULATORY_CARE_PROVIDER_SITE_OTHER): Payer: BC Managed Care – PPO | Admitting: Family Medicine

## 2019-08-29 ENCOUNTER — Encounter: Payer: Self-pay | Admitting: Family Medicine

## 2019-08-29 VITALS — BP 116/70 | HR 57 | Temp 96.2°F | Resp 16 | Wt 171.2 lb

## 2019-08-29 DIAGNOSIS — R5383 Other fatigue: Secondary | ICD-10-CM | POA: Diagnosis not present

## 2019-08-29 DIAGNOSIS — G9331 Postviral fatigue syndrome: Secondary | ICD-10-CM

## 2019-08-29 DIAGNOSIS — G933 Postviral fatigue syndrome: Secondary | ICD-10-CM | POA: Diagnosis not present

## 2019-08-29 NOTE — Patient Instructions (Signed)

## 2019-08-29 NOTE — Progress Notes (Signed)
Established patient visit   Patient: Dale Gray   DOB: 1955-08-31   64 y.o. Male  MRN: [id number] Visit Date: 08/29/2019  Today's healthcare provider: Vernie Murders, PA   Chief Complaint  Patient presents with  . Shortness of Breath   Subjective    Shortness of Breath This is a new problem. The current episode started 1 to 4 weeks ago. The problem occurs constantly. The problem has been gradually worsening. Pertinent negatives include no abdominal pain, chest pain, claudication, coryza, ear pain, fever, headaches, hemoptysis, leg pain, leg swelling, neck pain, orthopnea, PND, rash, rhinorrhea, sore throat, sputum production, swollen glands, syncope or vomiting. The symptoms are aggravated by exercise and any activity. There is no history of aspirin allergies, asthma, CAD, chronic lung disease, COPD, a heart failure, PE or pneumonia.   History of COVID infection 04-17-19 and out of work in isolation at home for 2 weeks. Had fever, severe energy loss and loss of taste and smell. Has been back to work and energy level improved. Had fatigue worsened again 08-25-19 after a "cold" but no fever, cough or sore throat. Slightly stopped up left ear without drainage.  Patient Active Problem List   Diagnosis Date Noted  . Special screening for malignant neoplasms, colon   . Personal history of colonic polyps   . Benign neoplasm of ascending colon   . Peripheral neuropathy 01/24/2015  . Anal fissure 01/22/2015  . Disorder of coccyx 01/22/2015  . History of digestive disease 01/22/2015  . HLD (hyperlipidemia) 01/22/2015  . Anemia, iron deficiency 01/22/2015  . Yeast dermatitis 06/22/2012  . Thrombosed hemorrhoids   . Fam hx-ischem heart disease 07/20/2007  . Acid reflux 05/26/2006  . Cannot sleep 10/12/2005  . Hypercholesterolemia without hypertriglyceridemia 10/12/2005   Past Medical History:  Diagnosis Date  . GERD (gastroesophageal reflux disease)   . Hemorrhoid   .  Hyperlipidemia   . Joint pain   . Reflux    Past Surgical History:  Procedure Laterality Date  . ANAL FISSURE REPAIR  2014  . COLONOSCOPY WITH PROPOFOL N/A 03/19/2017   Procedure: COLONOSCOPY WITH PROPOFOL;  Surgeon: Virgel Manifold, MD;  Location: [facility] ENDOSCOPY;  Service: Endoscopy;  Laterality: N/A;  . TYMPANOPLASTY Left 04/30/2014  . VASECTOMY  01/06/2002   Family History  Problem Relation Age of Onset  . Heart disease Mother   . Asthma Mother   . Heart disease Father   . CAD Father   . Healthy Sister   . Healthy Brother   . Healthy Daughter   . Healthy Daughter   . Diabetes Maternal Aunt   . Diabetes Maternal Uncle   . Colon cancer Neg Hx    Social History   Tobacco Use  . Smoking status: Never Smoker  . Smokeless tobacco: Never Used  Vaping Use  . Vaping Use: Never used  Substance Use Topics  . Alcohol use: No  . Drug use: No   Allergies  Allergen Reactions  . Lovastatin     Sleep disturbance and extremity pains     Medications: Outpatient Medications Prior to Visit  Medication Sig  . aspirin 81 MG tablet Take 81 mg by mouth daily.  Marland Kitchen ezetimibe (ZETIA) 10 MG tablet TAKE 1 TABLET BY MOUTH EVERY DAY  . fish oil-omega-3 fatty acids 1000 MG capsule Take 2 g by mouth daily.  Marland Kitchen omeprazole (PRILOSEC) 40 MG capsule TAKE 1 CAPSULE BY MOUTH EVERY DAY  . pregabalin (LYRICA) 50 MG capsule TAKE  1 CAPSULE(50 MG) BY MOUTH THREE TIMES DAILY  . zolpidem (AMBIEN CR) 12.5 MG CR tablet TAKE 1 TABLET BY MOUTH AT BEDTIME AS NEEDED   No facility-administered medications prior to visit.    Review of Systems  Constitutional: Negative for fever.  HENT: Negative for ear pain, rhinorrhea and sore throat.   Respiratory: Positive for shortness of breath. Negative for hemoptysis and sputum production.   Cardiovascular: Negative for chest pain, orthopnea, claudication, leg swelling, syncope and PND.  Gastrointestinal: Negative for abdominal pain and vomiting.  Musculoskeletal:  Negative for neck pain.  Skin: Negative for rash.  Neurological: Negative for headaches.   Last CBC Lab Results  Component Value Date   WBC 6.0 05/08/2019   HGB 13.7 05/08/2019   HCT 39.7 05/08/2019   MCV 90 05/08/2019   MCH 31.0 05/08/2019   RDW 12.5 05/08/2019   PLT 335 05/08/2019     Objective    BP 116/70   Pulse (!) 57   Temp (!) 96.2 F (35.7 C) (Oral)   Resp 16   Wt 171 lb 3.2 oz (77.7 kg)   SpO2 100%   BMI 24.56 kg/m  Wt Readings from Last 3 Encounters:  08/29/19 171 lb 3.2 oz (77.7 kg)  08/02/19 170 lb (77.1 kg)  05/05/19 172 lb (78 kg)    Physical Exam Constitutional:      Appearance: He is well-developed.  HENT:     Head: Normocephalic and atraumatic.     Right Ear: External ear normal.     Left Ear: External ear normal.     Nose: Nose normal.  Eyes:     General:        Right eye: No discharge.     Conjunctiva/sclera: Conjunctivae normal.     Pupils: Pupils are equal, round, and reactive to light.  Neck:     Thyroid: No thyromegaly.     Trachea: No tracheal deviation.  Cardiovascular:     Rate and Rhythm: Normal rate and regular rhythm.     Heart sounds: Normal heart sounds. No murmur heard.   Pulmonary:     Effort: Pulmonary effort is normal. No respiratory distress.     Breath sounds: Normal breath sounds. No decreased breath sounds, wheezing, rhonchi or rales.  Chest:     Chest wall: No tenderness.  Abdominal:     General: There is no distension.     Palpations: Abdomen is soft. There is no mass.     Tenderness: There is no abdominal tenderness. There is no guarding or rebound.  Musculoskeletal:        General: No tenderness. Normal range of motion.     Cervical back: Normal range of motion and neck supple.  Lymphadenopathy:     Cervical: No cervical adenopathy.  Skin:    General: Skin is warm and dry.     Findings: No erythema or rash.  Neurological:     Mental Status: He is alert and oriented to person, place, and time.      Cranial Nerves: No cranial nerve deficit.     Motor: No abnormal muscle tone.     Coordination: Coordination normal.     Deep Tendon Reflexes: Reflexes are normal and symmetric. Reflexes normal.  Psychiatric:        Behavior: Behavior normal.        Thought Content: Thought content normal.        Judgment: Judgment normal.    No results found for any visits on  08/29/19.  Assessment & Plan     1. Postviral fatigue syndrome History of COVID infection in February 2021 and was out of work 14 days. No infusion therapy needed and only had to use OTC cough and cold medications. No return of taste or smell, yet. No recent fever, body aches or headache. Recheck "cold" last week with a stopped up sensation in the left ear and some fluid noted behind the TM. May use OTC cold medication/decongestant. Check labs. - CBC with Differential/Platelet - Comprehensive metabolic panel - Sedimentation rate  2. Fatigue, unspecified type Flare of fatigue similar to his COVID infection. This episode started the end of last week. EKG normal sinus bradycardia. No fever, cough, sore throat or stuffy head. Will check labs for signs of electrolyte imbalance, infection or anemia. Call or retur prn. May need to recheck COVID test if any fever or signs of infection. - EKG 12-Lead - CBC with Differential/Platelet - Comprehensive metabolic panel - Sedimentation rate   No follow-ups on file.         Vernie Murders, [place] [phone] (phone) [phone] (fax)  [place]

## 2019-08-30 LAB — CBC WITH DIFFERENTIAL/PLATELET
Basophils Absolute: 0.1 10*3/uL (ref 0.0–0.2)
Basos: 1 %
EOS (ABSOLUTE): 0.2 10*3/uL (ref 0.0–0.4)
Eos: 3 %
Hematocrit: 43.6 % (ref 37.5–51.0)
Hemoglobin: 14.4 g/dL (ref 13.0–17.7)
Immature Grans (Abs): 0 10*3/uL (ref 0.0–0.1)
Immature Granulocytes: 0 %
Lymphocytes Absolute: 1.6 10*3/uL (ref 0.7–3.1)
Lymphs: 24 %
MCH: 30.4 pg (ref 26.6–33.0)
MCHC: 33 g/dL (ref 31.5–35.7)
MCV: 92 fL (ref 79–97)
Monocytes Absolute: 0.7 10*3/uL (ref 0.1–0.9)
Monocytes: 10 %
Neutrophils Absolute: 4.4 10*3/uL (ref 1.4–7.0)
Neutrophils: 62 %
Platelets: 310 10*3/uL (ref 150–450)
RBC: 4.74 x10E6/uL (ref 4.14–5.80)
RDW: 13 % (ref 11.6–15.4)
WBC: 7 10*3/uL (ref 3.4–10.8)

## 2019-08-30 LAB — COMPREHENSIVE METABOLIC PANEL
ALT: 12 IU/L (ref 0–44)
AST: 20 IU/L (ref 0–40)
Albumin/Globulin Ratio: 2.4 — ABNORMAL HIGH (ref 1.2–2.2)
Albumin: 5 g/dL — ABNORMAL HIGH (ref 3.8–4.8)
Alkaline Phosphatase: 74 IU/L (ref 48–121)
BUN/Creatinine Ratio: 14 (ref 10–24)
BUN: 17 mg/dL (ref 8–27)
Bilirubin Total: 0.3 mg/dL (ref 0.0–1.2)
CO2: 24 mmol/L (ref 20–29)
Calcium: 9.4 mg/dL (ref 8.6–10.2)
Chloride: 104 mmol/L (ref 96–106)
Creatinine, Ser: 1.25 mg/dL (ref 0.76–1.27)
GFR calc Af Amer: 71 mL/min/{1.73_m2} (ref >59)
GFR calc non Af Amer: 62 mL/min/{1.73_m2} (ref >59)
Globulin, Total: 2.1 g/dL (ref 1.5–4.5)
Glucose: 76 mg/dL (ref 65–99)
Potassium: 4 mmol/L (ref 3.5–5.2)
Sodium: 142 mmol/L (ref 134–144)
Total Protein: 7.1 g/dL (ref 6.0–8.5)

## 2019-08-30 LAB — SEDIMENTATION RATE: Sed Rate: 3 mm/hr (ref 0–30)

## 2019-09-03 ENCOUNTER — Other Ambulatory Visit: Payer: Self-pay | Admitting: Family Medicine

## 2019-09-03 DIAGNOSIS — F5101 Primary insomnia: Secondary | ICD-10-CM

## 2019-09-03 NOTE — Telephone Encounter (Signed)
Requested medication (s) are due for refill today: yes  Requested medication (s) are on the active medication list: yes  Last refill:  08/04/19  Future visit scheduled: no  Notes to clinic:  med not delegated to NT to RF   Requested Prescriptions  Pending Prescriptions Disp Refills   zolpidem (AMBIEN CR) 12.5 MG CR tablet [Pharmacy Med Name: ZOLPIDEM ER 12.5MG  TABLETS] 30 tablet     Sig: TAKE 1 TABLET BY MOUTH AT BEDTIME AS NEEDED      Not Delegated - Psychiatry:  Anxiolytics/Hypnotics Failed - 09/03/2019  9:36 AM      Failed - This refill cannot be delegated      Failed - Urine Drug Screen completed in last 360 days.      Passed - Valid encounter within last 6 months    Recent Outpatient Visits           5 days ago Postviral fatigue syndrome   [place], [place]   4 months ago Annual physical exam   [place], [place]   11 months ago Elevated PSA   [place], [place]   1 year ago Annual physical exam   [place], [place]   1 year ago Muscle spasm   [place], Clearnce Sorrel, [place]       Future Appointments             In 3 months [name], MD [place]

## 2019-10-05 ENCOUNTER — Other Ambulatory Visit: Payer: Self-pay | Admitting: Family Medicine

## 2019-10-05 DIAGNOSIS — F5101 Primary insomnia: Secondary | ICD-10-CM

## 2019-10-05 NOTE — Telephone Encounter (Signed)
Requested medication (s) are due for refill today: yes  Requested medication (s) are on the active medication list: yes  Last refill:  09/04/2019  Future visit scheduled: no  Notes to clinic:  this refill cannot be delegated    Requested Prescriptions  Pending Prescriptions Disp Refills   zolpidem (AMBIEN CR) 12.5 MG CR tablet [Pharmacy Med Name: ZOLPIDEM ER 12.5MG  TABLETS] 30 tablet     Sig: TAKE 1 TABLET BY MOUTH AT BEDTIME AS NEEDED      Not Delegated - Psychiatry:  Anxiolytics/Hypnotics Failed - 10/05/2019  2:19 PM      Failed - This refill cannot be delegated      Failed - Urine Drug Screen completed in last 360 days.      Passed - Valid encounter within last 6 months    Recent Outpatient Visits           1 month ago Postviral fatigue syndrome   [place], [place]   5 months ago Annual physical exam   [place], [place]   1 year ago Elevated PSA   [place], [place]   1 year ago Annual physical exam   [place], [place]   2 years ago Muscle spasm   [place], Clearnce Sorrel, [place]       Future Appointments             In 2 months [name], MD [place]

## 2019-10-25 ENCOUNTER — Other Ambulatory Visit: Payer: Self-pay | Admitting: Family Medicine

## 2019-10-25 NOTE — Telephone Encounter (Signed)
Requested Prescriptions  Pending Prescriptions Disp Refills  . ezetimibe (ZETIA) 10 MG tablet [Pharmacy Med Name: EZETIMIBE 10MG  TABLETS] 90 tablet 1    Sig: TAKE 1 TABLET BY MOUTH EVERY DAY     Cardiovascular:  Antilipid - Sterol Transport Inhibitors Failed - 10/25/2019  3:40 AM      Failed - LDL in normal range and within 360 days    LDL Cholesterol (Calc)  Date Value Ref Range Status  02/18/2017 130 (H) mg/dL (calc) Final    Comment:    Reference range: <100 . Desirable range <100 mg/dL for primary prevention;   <70 mg/dL for patients with CHD or diabetic patients  with > or = 2 CHD risk factors. Marland Kitchen LDL-C is now calculated using the Martin-Hopkins  calculation, which is a validated novel method providing  better accuracy than the Friedewald equation in the  estimation of LDL-C.  Cresenciano Genre et al. Annamaria Helling. 9518;841(66): 2061-2068  (http://education.[employer].com/faq/FAQ164)    LDL Chol Calc (NIH)  Date Value Ref Range Status  05/08/2019 123 (H) 0 - 99 mg/dL Final         Passed - Total Cholesterol in normal range and within 360 days    Cholesterol, Total  Date Value Ref Range Status  05/08/2019 183 100 - 199 mg/dL Final         Passed - HDL in normal range and within 360 days    HDL  Date Value Ref Range Status  05/08/2019 44 >39 mg/dL Final         Passed - Triglycerides in normal range and within 360 days    Triglycerides  Date Value Ref Range Status  05/08/2019 89 0 - 149 mg/dL Final         Passed - Valid encounter within last 12 months    Recent Outpatient Visits          1 month ago Postviral fatigue syndrome   [facility], Vickki Muff, [place]   5 months ago Annual physical exam   [place], [place]   1 year ago Elevated PSA   [facility], Vickki Muff, [place]   1 year ago Annual physical exam   [place], [place]   2 years ago Muscle spasm   [place], Clearnce Sorrel, [place]      Future Appointments            In 1 month [name], MD [place]

## 2019-10-30 ENCOUNTER — Other Ambulatory Visit: Payer: Self-pay | Admitting: Family Medicine

## 2019-10-30 DIAGNOSIS — G629 Polyneuropathy, unspecified: Secondary | ICD-10-CM

## 2019-10-30 NOTE — Telephone Encounter (Signed)
Requested medication (s) are due for refill today:yes  Requested medication (s) are on the active medication list: yes    Last refill: 06/20/19  #90  1 refill  Future visit scheduled {no  Notes to clinic:not delegated  Requested Prescriptions  Pending Prescriptions Disp Refills   pregabalin (LYRICA) 50 MG capsule [Pharmacy Med Name: PREGABALIN 50MG  CAPSULES] 90 capsule     Sig: TAKE 1 CAPSULE(50 MG) BY MOUTH THREE TIMES DAILY      Not Delegated - Neurology:  Anticonvulsants - Controlled Failed - 10/30/2019 12:42 PM      Failed - This refill cannot be delegated      Passed - Valid encounter within last 12 months    Recent Outpatient Visits           2 months ago Postviral fatigue syndrome   [place], [place]   5 months ago Annual physical exam   [place], [place]   1 year ago Elevated PSA   [facility], Vickki Muff, [place]   1 year ago Annual physical exam   [place], [place]   2 years ago Muscle spasm   [facility] [place], Clearnce Sorrel, [place]       Future Appointments             In 1 month [name], MD [place]

## 2019-12-04 ENCOUNTER — Other Ambulatory Visit: Payer: Self-pay

## 2019-12-04 DIAGNOSIS — C61 Malignant neoplasm of prostate: Secondary | ICD-10-CM

## 2019-12-06 ENCOUNTER — Other Ambulatory Visit: Payer: Self-pay

## 2019-12-06 ENCOUNTER — Other Ambulatory Visit: Payer: BC Managed Care – PPO

## 2019-12-06 DIAGNOSIS — C61 Malignant neoplasm of prostate: Secondary | ICD-10-CM

## 2019-12-07 LAB — PSA: Prostate Specific Ag, Serum: 5 ng/mL — ABNORMAL HIGH (ref 0.0–4.0)

## 2019-12-13 NOTE — Progress Notes (Signed)
12/15/2019 11:57 AM   Dale Gray August 17, 1955 [id number]  Referring provider: Margo Common, [place],  [place] [address] Chief Complaint  Patient presents with  . Prostate Cancer    HPI: Dale Gray is a 64 y.o. male who presents for a 5 month follow up of prostate cancer. Patient is accompanied by his wife today present on speaker phone.  He presented with a rising PSA up to 7.3 highly concerning for prostate cancer. He had did have induration on rectal exam on the left side. On transrectal ultrasound, he had an abnormal exam with a significantly dilated seminal vesicles, left greater than right with ill-defined lateral border of his prostate which was highly concerning with hypoechoic lesions.  At the time of biopsy9/2020, I did take 2 biopsies from the left bases this was most suspicious. I attempted to biopsy the seminal vesicle specifically.  Biopsy results indicate 1 of 12 cores positive for malignancy, Gleason 3+3 at the left base involving 100% of the tissue. Per the report, there is no evidence of seminal vesicle tissue. There is also a suspicious area at the left lateral base as well but not diagnostic for prostate cancer.TRUS vol 51.8.  He underwent MRI shortly after biopsy however this obscured by hemorrhage.  Repeat MRI from 03/28/19 was reassuring with no evidence of macroscopic disease. 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  Kidneys radiographically normal and abdominal ultrasound in 2012with some fullness of the left renal pelvis.  PSA 5.0 as of 12/06/2019.   The patient is doing well. He has an occasional weak stream. No hematuria or dysuria. No flank, abdominal or pelvic pain.   PSA trend: Component     Latest Ref Rng & Units 03/10/2018 09/12/2018 10/25/2018 04/04/2019  Prostate Specific Ag, Serum     0.0 - 4.0 ng/mL  6.5 (H) 7.3 (H) 7.3 (H) 9.1 (H)   Component     Latest Ref Rng & Units 07/31/2019 12/06/2019  Prostate Specific Ag, Serum     0.0 - 4.0 ng/mL 9.2 (H) 5.0 (H)      PMH: Past Medical History:  Diagnosis Date  . GERD (gastroesophageal reflux disease)   . Hemorrhoid   . Hyperlipidemia   . Joint pain   . Reflux     Surgical History: Past Surgical History:  Procedure Laterality Date  . ANAL FISSURE REPAIR  2014  . COLONOSCOPY WITH PROPOFOL N/A 03/19/2017   Procedure: COLONOSCOPY WITH PROPOFOL;  Surgeon: Virgel Manifold, MD;  Location: [facility] ENDOSCOPY;  Service: Endoscopy;  Laterality: N/A;  . TYMPANOPLASTY Left 04/30/2014  . VASECTOMY  01/06/2002    Home Medications:  Allergies as of 12/14/2019      Reactions   Lovastatin    Sleep disturbance and extremity pains      Medication List       Accurate as of December 14, 2019 11:57 AM. If you have any questions, ask your nurse or doctor.        aspirin 81 MG tablet Take 81 mg by mouth daily.   ezetimibe 10 MG tablet Commonly known as: ZETIA TAKE 1 TABLET BY MOUTH EVERY DAY   fish oil-omega-3 fatty acids 1000 MG capsule Take 2 g by mouth daily.   omeprazole 40 MG capsule Commonly known as: PRILOSEC TAKE 1 CAPSULE BY MOUTH EVERY DAY   pregabalin 50 MG capsule Commonly known as: LYRICA TAKE 1 CAPSULE(50 MG)  BY MOUTH THREE TIMES DAILY   zolpidem 12.5 MG CR tablet Commonly known as: AMBIEN CR TAKE 1 TABLET BY MOUTH AT BEDTIME AS NEEDED       Allergies:  Allergies  Allergen Reactions  . Lovastatin     Sleep disturbance and extremity pains    Family History: Family History  Problem Relation Age of Onset  . Heart disease Mother   . Asthma Mother   . Heart disease Father   . CAD Father   . Healthy Sister   . Healthy Brother   . Healthy Daughter   . Healthy Daughter   . Diabetes Maternal Aunt   . Diabetes Maternal Uncle   . Colon cancer Neg Hx     Social History:  reports that he has never smoked.  He has never used smokeless tobacco. He reports that he does not drink alcohol and does not use drugs.   Physical Exam: BP (!) 145/80   Pulse (!) 58   Ht 5\' 10"  (1.778 m)   Wt 175 lb (79.4 kg)   BMI 25.11 kg/m   Constitutional:  Alert and oriented, No acute distress. HEENT: [place] AT, moist mucus membranes.  Trachea midline, no masses. Cardiovascular: No clubbing, cyanosis, or edema. Respiratory: Normal respiratory effort, no increased work of breathing. Skin: No rashes, bruises or suspicious lesions. Neurologic: Grossly intact, no focal deficits, moving all 4 extremities. Psychiatric: Normal mood and affect.  Laboratory Data:  Lab Results  Component Value Date   CREATININE 1.25 08/29/2019     Assessment & Plan:    1. Prostate cancer Low risk prostate cancer on active surveillance Repeat MRI is reassuring without evidence of any microscopic disease PSA has trended down from 9.2 to 5.0 as of 12/06/2019, very reassuring RTC in 6 months with PSA prior and DRE.   [place] [address], [place], [place] [address] [phone]  I, Selena Batten, am acting as a scribe for Dr. [name].  I have reviewed the above documentation for accuracy and completeness, and I agree with the above.   [name], MD

## 2019-12-14 ENCOUNTER — Encounter: Payer: Self-pay | Admitting: Urology

## 2019-12-14 ENCOUNTER — Other Ambulatory Visit: Payer: Self-pay

## 2019-12-14 ENCOUNTER — Ambulatory Visit (INDEPENDENT_AMBULATORY_CARE_PROVIDER_SITE_OTHER): Payer: BC Managed Care – PPO | Admitting: Urology

## 2019-12-14 VITALS — BP 145/80 | HR 58 | Ht 70.0 in | Wt 175.0 lb

## 2019-12-14 DIAGNOSIS — C61 Malignant neoplasm of prostate: Secondary | ICD-10-CM

## 2020-01-04 ENCOUNTER — Ambulatory Visit (INDEPENDENT_AMBULATORY_CARE_PROVIDER_SITE_OTHER): Payer: BC Managed Care – PPO | Admitting: Adult Health

## 2020-01-04 ENCOUNTER — Other Ambulatory Visit: Payer: Self-pay

## 2020-01-04 DIAGNOSIS — Z23 Encounter for immunization: Secondary | ICD-10-CM | POA: Diagnosis not present

## 2020-02-01 ENCOUNTER — Other Ambulatory Visit: Payer: Self-pay | Admitting: Family Medicine

## 2020-02-01 DIAGNOSIS — F5101 Primary insomnia: Secondary | ICD-10-CM

## 2020-02-02 NOTE — Telephone Encounter (Signed)
Requested medication (s) are due for refill today: yes   Requested medication (s) are on the active medication list: yes   Last refill:  01/02/2020  Future visit scheduled: no  Notes to clinic:  this refill cannot be delegated    Requested Prescriptions  Pending Prescriptions Disp Refills   zolpidem (AMBIEN CR) 12.5 MG CR tablet [Pharmacy Med Name: ZOLPIDEM ER 12.5MG  TABLETS] 30 tablet     Sig: TAKE 1 TABLET BY MOUTH AT BEDTIME AS NEEDED      Not Delegated - Psychiatry:  Anxiolytics/Hypnotics Failed - 02/01/2020  8:55 PM      Failed - This refill cannot be delegated      Failed - Urine Drug Screen completed in last 360 days      Passed - Valid encounter within last 6 months    Recent Outpatient Visits           5 months ago Postviral fatigue syndrome   [place], [place]   9 months ago Annual physical exam   [place], [place]   1 year ago Elevated PSA   [facility], Vickki Muff, [place]   1 year ago Annual physical exam   [place], [place]   2 years ago Muscle spasm   [facility] [place], Clearnce Sorrel, [place]       Future Appointments             In 4 months [name], MD [place]

## 2020-03-02 ENCOUNTER — Other Ambulatory Visit: Payer: Self-pay | Admitting: Family Medicine

## 2020-03-03 NOTE — Telephone Encounter (Signed)
Requested Prescriptions  Pending Prescriptions Disp Refills  . omeprazole (PRILOSEC) 40 MG capsule [Pharmacy Med Name: OMEPRAZOLE 40MG  CAPSULES] 90 capsule 3    Sig: TAKE 1 CAPSULE BY MOUTH EVERY DAY     Gastroenterology: Proton Pump Inhibitors Passed - 03/02/2020  3:41 AM      Passed - Valid encounter within last 12 months    Recent Outpatient Visits          6 months ago Postviral fatigue syndrome   [facility], Vickki Muff, [place]   10 months ago Annual physical exam   [place], PA-C   1 year ago Elevated PSA   [facility], Vickki Muff, PA-C   1 year ago Annual physical exam   [place], PA-C   2 years ago Muscle spasm   [place], Clearnce Sorrel, [place]      Future Appointments            In 3 months [name], MD [place]

## 2020-04-27 ENCOUNTER — Other Ambulatory Visit: Payer: Self-pay | Admitting: Family Medicine

## 2020-04-29 ENCOUNTER — Other Ambulatory Visit: Payer: Self-pay | Admitting: Family Medicine

## 2020-04-29 DIAGNOSIS — G629 Polyneuropathy, unspecified: Secondary | ICD-10-CM

## 2020-04-29 NOTE — Telephone Encounter (Signed)
Requested medication (s) are due for refill today:   Provider to review  Requested medication (s) are on the active medication list:   Yes  Future visit scheduled:   Yes   Last ordered: 10/31/2019 #90, 3 refills  Clinic note:  Returned since this is a non delegated refill   Requested Prescriptions  Pending Prescriptions Disp Refills   pregabalin (LYRICA) 50 MG capsule [Pharmacy Med Name: PREGABALIN 50MG  CAPSULES] 90 capsule     Sig: TAKE 1 CAPSULE(50 MG) BY MOUTH THREE TIMES DAILY      Not Delegated - Neurology:  Anticonvulsants - Controlled Failed - 04/29/2020  8:05 AM      Failed - This refill cannot be delegated      Passed - Valid encounter within last 12 months    Recent Outpatient Visits           8 months ago Postviral fatigue syndrome   [facility], Vickki Muff, PA-C   12 months ago Annual physical exam   [place], PA-C   1 year ago Elevated PSA   [facility], Vickki Muff, PA-C   2 years ago Annual physical exam   [place], PA-C   2 years ago Muscle spasm   [place], Clearnce Sorrel, [place]       Future Appointments             In 1 week Fisher, Kirstie Peri, MD [facility], [place]   In 1 month [name], [place]

## 2020-05-10 ENCOUNTER — Other Ambulatory Visit: Payer: Self-pay

## 2020-05-10 ENCOUNTER — Ambulatory Visit (INDEPENDENT_AMBULATORY_CARE_PROVIDER_SITE_OTHER): Payer: BC Managed Care – PPO | Admitting: Family Medicine

## 2020-05-10 ENCOUNTER — Telehealth: Payer: Self-pay

## 2020-05-10 VITALS — BP 127/72 | HR 54 | Temp 97.4°F | Ht 73.0 in | Wt 172.0 lb

## 2020-05-10 DIAGNOSIS — F5101 Primary insomnia: Secondary | ICD-10-CM

## 2020-05-10 DIAGNOSIS — Z0000 Encounter for general adult medical examination without abnormal findings: Secondary | ICD-10-CM

## 2020-05-10 DIAGNOSIS — E7800 Pure hypercholesterolemia, unspecified: Secondary | ICD-10-CM | POA: Diagnosis not present

## 2020-05-10 DIAGNOSIS — R972 Elevated prostate specific antigen [PSA]: Secondary | ICD-10-CM | POA: Diagnosis not present

## 2020-05-10 DIAGNOSIS — L989 Disorder of the skin and subcutaneous tissue, unspecified: Secondary | ICD-10-CM

## 2020-05-10 DIAGNOSIS — G629 Polyneuropathy, unspecified: Secondary | ICD-10-CM

## 2020-05-10 MED ORDER — EZETIMIBE 10 MG PO TABS: 10.0000 mg | ORAL_TABLET | Freq: Every day | ORAL | 4 refills | Status: DC

## 2020-05-10 MED ORDER — PREGABALIN 50 MG PO CAPS
50.0000 mg | ORAL_CAPSULE | Freq: Three times a day (TID) | ORAL | 1 refills | Status: DC
Start: 2020-05-10 — End: 2021-03-13

## 2020-05-10 MED ORDER — OMEPRAZOLE 40 MG PO CPDR: 40.0000 mg | DELAYED_RELEASE_CAPSULE | Freq: Every day | ORAL | 4 refills | Status: DC

## 2020-05-10 MED ORDER — ZOLPIDEM TARTRATE ER 12.5 MG PO TBCR
12.5000 mg | EXTENDED_RELEASE_TABLET | Freq: Every evening | ORAL | 1 refills | Status: DC | PRN
Start: 2020-05-10 — End: 2020-09-02

## 2020-05-10 NOTE — Telephone Encounter (Signed)
Patient's wife wants him to be referred to [place], Dr. Nehemiah Massed for some spots on his head that needs checked.      Also he is now using [facility] "mail in" for his medications.   He needs ALL of his meds sent for 90 tablets with 3 refills.

## 2020-05-10 NOTE — Progress Notes (Signed)
Complete physical exam   Patient: Dale Gray   DOB: 1955-03-19   65 y.o. Male  MRN: [id number] Visit Date: 05/10/2020  Today's healthcare provider: Lelon Huh, MD   Chief Complaint  Patient presents with  . Annual Exam   Subjective    Dale Gray is a 65 y.o. male who presents today for a complete physical exam.  He reports consuming a general diet. The patient has a physically strenuous job, but has no regular exercise apart from work.  He generally feels well. He reports sleeping well. He does have additional problems to discuss today.      Past Medical History:  Diagnosis Date  . GERD (gastroesophageal reflux disease)   . Hemorrhoid   . Hyperlipidemia   . Joint pain   . Reflux    Past Surgical History:  Procedure Laterality Date  . ANAL FISSURE REPAIR  2014  . COLONOSCOPY WITH PROPOFOL N/A 03/19/2017   Procedure: COLONOSCOPY WITH PROPOFOL;  Surgeon: Virgel Manifold, MD;  Location: [facility] ENDOSCOPY;  Service: Endoscopy;  Laterality: N/A;  . TYMPANOPLASTY Left 04/30/2014  . VASECTOMY  01/06/2002   Social History   Socioeconomic History  . Marital status: Married    Spouse name: Not on file  . Number of children: Not on file  . Years of education: Not on file  . Highest education level: Not on file  Occupational History  . Not on file  Tobacco Use  . Smoking status: Never Smoker  . Smokeless tobacco: Never Used  Vaping Use  . Vaping Use: Never used  Substance and Sexual Activity  . Alcohol use: No  . Drug use: No  . Sexual activity: Not on file  Other Topics Concern  . Not on file  Social History Narrative  . Not on file   Social Determinants of Health   Financial Resource Strain: Not on file  Food Insecurity: Not on file  Transportation Needs: Not on file  Physical Activity: Not on file  Stress: Not on file  Social Connections: Not on file  Intimate Partner Violence: Not on file   Family Status  Relation Name Status  .  Mother  Alive  . Father  Alive  . Sister  Alive  . Brother  Alive  . Daughter  Alive  . Daughter  Alive  . Mat Aunt  (Not Specified)  . Mat Uncle  (Not Specified)  . Neg Hx  (Not Specified)   Family History  Problem Relation Age of Onset  . Heart disease Mother   . Asthma Mother   . Heart disease Father   . CAD Father   . Healthy Sister   . Healthy Brother   . Healthy Daughter   . Healthy Daughter   . Diabetes Maternal Aunt   . Diabetes Maternal Uncle   . Colon cancer Neg Hx    Allergies  Allergen Reactions  . Lovastatin     Sleep disturbance and extremity pains    Patient Care Team: Chrismon, Vickki Muff, PA-C as PCP - General (Family Medicine) Bary Castilla, Forest Gleason, MD as Consulting Physician (General Surgery)   Medications: Outpatient Medications Prior to Visit  Medication Sig  . aspirin 81 MG tablet Take 81 mg by mouth daily.  Marland Kitchen ezetimibe (ZETIA) 10 MG tablet TAKE 1 TABLET BY MOUTH EVERY DAY  . fish oil-omega-3 fatty acids 1000 MG capsule Take 2 g by mouth daily.  Marland Kitchen omeprazole (PRILOSEC) 40 MG capsule TAKE 1 CAPSULE  BY MOUTH EVERY DAY  . pregabalin (LYRICA) 50 MG capsule TAKE 1 CAPSULE(50 MG) BY MOUTH THREE TIMES DAILY  . zolpidem (AMBIEN CR) 12.5 MG CR tablet TAKE 1 TABLET BY MOUTH AT BEDTIME AS NEEDED   No facility-administered medications prior to visit.    Review of Systems  HENT: Positive for hearing loss.   Respiratory: Positive for shortness of breath.   Musculoskeletal: Positive for arthralgias.      Objective    BP 127/72 (BP Location: Right Arm, Patient Position: Sitting, Cuff Size: Large)   Pulse (!) 54   Temp (!) 97.4 F (36.3 C) (Oral)   Ht 6\' 1"  (1.854 m)   Wt 172 lb (78 kg)   SpO2 100%   BMI 22.69 kg/m    Physical Exam    General Appearance:    Well developed, well nourished male. Alert, cooperative, in no acute distress, appears stated age  Head:    Normocephalic, without obvious abnormality, atraumatic  Eyes:    PERRL,  conjunctiva/corneas clear, EOM's intact, fundi    benign, both eyes       Ears:    Normal TM's and external ear canals, both ears  Neck:   Supple, symmetrical, trachea midline, no adenopathy;       thyroid:  No enlargement/tenderness/nodules; no carotid   bruit or JVD  Back:     Symmetric, no curvature, ROM normal, no CVA tenderness  Lungs:     Clear to auscultation bilaterally, respirations unlabored  Chest wall:    No tenderness or deformity  Heart:    Bradycardic. Normal rhythm. No murmurs, rubs, or gallops.  S1 and S2 normal  Abdomen:     Soft, non-tender, bowel sounds active all four quadrants,    no masses, no organomegaly  Genitalia:    deferred  Rectal:    deferred  Extremities:   All extremities are intact. No cyanosis or edema  Pulses:   2+ and symmetric all extremities  Skin:   Skin color, texture, turgor normal, no rashes or lesions  Lymph nodes:   Cervical, supraclavicular, and axillary nodes normal  Neurologic:   CNII-XII intact. Normal strength, sensation and reflexes      throughout     Last depression screening scores PHQ 2/9 Scores 05/10/2020 05/05/2019 03/10/2018  PHQ - 2 Score 0 0 0  PHQ- 9 Score 3 1 0   Last fall risk screening Fall Risk  05/10/2020  Falls in the past year? 1  Number falls in past yr: (No Data)  Comment Pt did not specifiy  Injury with Fall? 1   Last Audit-C alcohol use screening Alcohol Use Disorder Test (AUDIT) 05/10/2020  1. How often do you have a drink containing alcohol? 0  2. How many drinks containing alcohol do you have on a typical day when you are drinking? 0  3. How often do you have six or more drinks on one occasion? 0  AUDIT-C Score 0  Alcohol Brief Interventions/Follow-up AUDIT Score <7 follow-up not indicated   A score of 3 or more in women, and 4 or more in men indicates increased risk for alcohol abuse, EXCEPT if all of the points are from question 1   No results found for any visits on 05/10/20.  Assessment & Plan     Routine Health Maintenance and Physical Exam  Exercise Activities and Dietary recommendations Goals   None     Immunization History  Administered Date(s) Administered  . Influenza,inj,Quad PF,6+ Mos 12/13/2015, 11/26/2016,  01/25/2018, 01/03/2019, 01/04/2020  . Td 02/18/2017  . Tdap 05/26/2006  . Zoster Recombinat (Shingrix) 03/10/2018, 05/09/2018    Health Maintenance  Topic Date Due  . COVID-19 Vaccine (1) Never done  . COLONOSCOPY (Pts 45-68yrs Insurance coverage will need to be confirmed)  03/19/2022  . TETANUS/TDAP  02/19/2027  . INFLUENZA VACCINE  Completed  . Hepatitis C Screening  Completed  . HIV Screening  Completed  . HPV VACCINES  Aged Out    Discussed health benefits of physical activity, and encouraged him to engage in regular exercise appropriate for his age and condition.  1. Annual physical exam  - Lipid panel - Comprehensive metabolic panel  2. Pure hypercholesterolemia  - Lipid panel - Comprehensive metabolic panel  3. Elevated PSA Has follow up Dr. [name] in April. He would like to have PSA done today.  - PSA      The entirety of the information documented in the History of Present Illness, Review of Systems and Physical Exam were personally obtained by me. Portions of this information were initially documented by the CMA and reviewed by me for thoroughness and accuracy.      Lelon Huh, MD  [facility] [phone] (phone) [phone] (fax)  [place]

## 2020-05-10 NOTE — Telephone Encounter (Signed)
Please review. Medications pended. Ok to place order for referral?

## 2020-05-11 LAB — COMPREHENSIVE METABOLIC PANEL
ALT: 14 IU/L (ref 0–44)
AST: 16 IU/L (ref 0–40)
Albumin/Globulin Ratio: 2.1 (ref 1.2–2.2)
Albumin: 4.6 g/dL (ref 3.8–4.8)
Alkaline Phosphatase: 65 IU/L (ref 44–121)
BUN/Creatinine Ratio: 17 (ref 10–24)
BUN: 18 mg/dL (ref 8–27)
Bilirubin Total: 0.4 mg/dL (ref 0.0–1.2)
CO2: 22 mmol/L (ref 20–29)
Calcium: 9.6 mg/dL (ref 8.6–10.2)
Chloride: 106 mmol/L (ref 96–106)
Creatinine, Ser: 1.07 mg/dL (ref 0.76–1.27)
Globulin, Total: 2.2 g/dL (ref 1.5–4.5)
Glucose: 64 mg/dL — ABNORMAL LOW (ref 65–99)
Potassium: 4.6 mmol/L (ref 3.5–5.2)
Sodium: 144 mmol/L (ref 134–144)
Total Protein: 6.8 g/dL (ref 6.0–8.5)
eGFR: 78 mL/min/{1.73_m2} (ref >59)

## 2020-05-11 LAB — LIPID PANEL
Chol/HDL Ratio: 4.9 ratio (ref 0.0–5.0)
Cholesterol, Total: 207 mg/dL — ABNORMAL HIGH (ref 100–199)
HDL: 42 mg/dL (ref >39)
LDL Chol Calc (NIH): 127 mg/dL — ABNORMAL HIGH (ref 0–99)
Triglycerides: 216 mg/dL — ABNORMAL HIGH (ref 0–149)
VLDL Cholesterol Cal: 38 mg/dL (ref 5–40)

## 2020-05-11 LAB — PSA: Prostate Specific Ag, Serum: 6.1 ng/mL — ABNORMAL HIGH (ref 0.0–4.0)

## 2020-06-10 ENCOUNTER — Other Ambulatory Visit: Payer: Self-pay

## 2020-06-10 DIAGNOSIS — C61 Malignant neoplasm of prostate: Secondary | ICD-10-CM

## 2020-06-11 ENCOUNTER — Other Ambulatory Visit: Payer: Self-pay

## 2020-06-11 ENCOUNTER — Other Ambulatory Visit: Payer: BC Managed Care – PPO

## 2020-06-11 DIAGNOSIS — C61 Malignant neoplasm of prostate: Secondary | ICD-10-CM

## 2020-06-11 NOTE — Progress Notes (Signed)
06/12/2020  1:02 PM   Dale Gray 01-17-1956 [id number]  Referring provider: Margo Common, PA-C [address] [place],  [place] [address] Chief Complaint  Patient presents with  . Prostate Cancer    66mo follow up w/PSA    HPI: Dale Gray is a 65 y.o. male who presents today for a 6 month follow up with a DRE and discussion of recent PSA results.  Recent PSA value was 9.3 ng/mL on 06/12/2019, which has increased from 6.1 ng/mL on 05/10/2020.  Details from the previous physical exam revealed induration on rectal exam on the left side. On transrectal ultrasound, he had an abnormal exam with a significantly dilated seminal vesicles, left greater than right with ill-defined lateral border of his prostate which was highly concerning with hypoechoic lesions.  Two biopsies from 11/2018 showed 1 of 12 cores positive for malignancy, Gleason 3+3 at the left base involving 100% of the tissue.Per the report, there is no evidence of seminal vesicle tissue.There is also a suspicious area at the left lateral base as well but not diagnostic for prostate cancer.TRUS vol 51.8.  Today the patient complains of appetite loss x3 months. Denies weight loss. Patient has recently had COVID around December and this might be when his appetite decreased.    He denies hematuria, dysuria, flank, abdominal or flank pain.   PSA trend: Component     Latest Ref Rng & Units 03/10/2018 09/12/2018 10/25/2018 04/04/2019  Prostate Specific Ag, Serum     0.0 - 4.0 ng/mL 6.5 (H) 7.3 (H) 7.3 (H) 9.1 (H)   Component Latest Ref Rng & Units      07/31/2019 12/06/2019 05/10/2020 06/11/2020  Prostate Specific Ag, Serum     0.0 - 4.0 ng/mL 9.2 (H) 5.0 (H) 6.1 (H) 9.3 (H)       PMH: Past Medical History:  Diagnosis Date  . GERD (gastroesophageal reflux disease)   . Hemorrhoid   . Hyperlipidemia   . Joint pain   . Reflux     Surgical History: Past Surgical History:  Procedure Laterality Date  . ANAL  FISSURE REPAIR  2014  . COLONOSCOPY WITH PROPOFOL N/A 03/19/2017   Procedure: COLONOSCOPY WITH PROPOFOL;  Surgeon: Virgel Manifold, MD;  Location: [facility] ENDOSCOPY;  Service: Endoscopy;  Laterality: N/A;  . TYMPANOPLASTY Left 04/30/2014  . VASECTOMY  01/06/2002    Home Medications:  Allergies as of 06/12/2020      Reactions   Lovastatin    Sleep disturbance and extremity pains      Medication List       Accurate as of June 12, 2020  1:02 PM. If you have any questions, ask your nurse or doctor.        aspirin 81 MG tablet Take 81 mg by mouth daily.   ezetimibe 10 MG tablet Commonly known as: ZETIA Take 1 tablet (10 mg total) by mouth daily.   fish oil-omega-3 fatty acids 1000 MG capsule Take 2 g by mouth daily.   omeprazole 40 MG capsule Commonly known as: PRILOSEC Take 1 capsule (40 mg total) by mouth daily.   pregabalin 50 MG capsule Commonly known as: LYRICA Take 1 capsule (50 mg total) by mouth 3 (three) times daily.   zolpidem 12.5 MG CR tablet Commonly known as: AMBIEN CR Take 1 tablet (12.5 mg total) by mouth at bedtime as needed.       Allergies: Allergies  Allergen Reactions  . Lovastatin     Sleep disturbance  and extremity pains    Family History: Family History  Problem Relation Age of Onset  . Heart disease Mother   . Asthma Mother   . Heart disease Father   . CAD Father   . Healthy Sister   . Healthy Brother   . Healthy Daughter   . Healthy Daughter   . Diabetes Maternal Aunt   . Diabetes Maternal Uncle   . Colon cancer Neg Hx     Social History:   reports that he has never smoked. He has never used smokeless tobacco. He reports that he does not drink alcohol and does not use drugs.   Physical Exam: BP 139/77   Pulse (!) 56   Ht 5\' 10"  (1.778 m)   Wt 172 lb (78 kg)   BMI 24.68 kg/m   Constitutional:  Alert and oriented, No acute distress. HEENT: [place] AT, moist mucus membranes.  Trachea midline, no masses. Cardiovascular: No  clubbing, cyanosis, or edema. Respiratory: Normal respiratory effort, no increased work of breathing. Rectal: Prostate is 40 grams, noted hard nodule on mid-left side (pearl sized and shaped), no tenderness, with normal sphincter tone. Skin: No rashes, bruises or suspicious lesions. Neurologic: Grossly intact, no focal deficits, moving all 4 extremities. Psychiatric: Normal mood and affect.   Assessment & Plan:    1. Prostate cancer on active surveillance Personal history of PSA fluctuation and current PSA is more elevated today but still within own personal range  2. Abnormal rectal exam Marked change in prostate exam today and suspicious; Previously firm on the left, and now there is a distinct discrete nodule.  In light of recent PSA rise as well as change in rectal exam, we discussed prostate MRI versus proceeding with repeat biopsy.  Favor repeating biopsy especially given palpable disease as this will likely be the highest yield intervention.  He is agreeable this plan.  We discussed prostate biopsy in detail including the procedure itself, the risks of blood in the urine, stool, and ejaculate, serious infection, and discomfort. He is willing to proceed with this as discussed.    Follow Up: Return for prostate biopsy.   [name], MD   I, Ardyth Gal, am acting as a scribe for Dr. [name].   [place] [address], [place], [place] [address] [phone]

## 2020-06-12 ENCOUNTER — Encounter: Payer: Self-pay | Admitting: Urology

## 2020-06-12 ENCOUNTER — Ambulatory Visit: Payer: BC Managed Care – PPO | Admitting: Urology

## 2020-06-12 VITALS — BP 139/77 | HR 56 | Ht 70.0 in | Wt 172.0 lb

## 2020-06-12 DIAGNOSIS — C61 Malignant neoplasm of prostate: Secondary | ICD-10-CM

## 2020-06-12 LAB — PSA: Prostate Specific Ag, Serum: 9.3 ng/mL — ABNORMAL HIGH (ref 0.0–4.0)

## 2020-06-12 NOTE — Patient Instructions (Signed)

## 2020-06-13 ENCOUNTER — Ambulatory Visit: Payer: Self-pay | Admitting: Urology

## 2020-07-08 NOTE — Progress Notes (Signed)
   07/11/20  CC:  Chief Complaint  Patient presents with  . Prostate Biopsy    HPI: 65 year old male with a personal history of elevated/fluctuating PSA, low risk prostate cancer with rising PSA.  Blood pressure 134/85, pulse (!) 55. NED. A&Ox3.   No respiratory distress   Abd soft, NT, ND Normal sphincter tone  Prostate Biopsy Procedure   Informed consent was obtained after discussing risks/benefits of the procedure.  A time out was performed to ensure correct patient identity.  Pre-Procedure: - Ceftriaxone given prophylactically - Levaquin 500 mg administered PO -Transrectal Ultrasound performed revealing a 57.3 gm prostate -No significant hypoechoic or median lobe noted -Atypical appearing massively dilated seminal vesicles, left greater than right as previously appreciated  Procedure: - Prostate block performed using 10 cc 1% lidocaine and biopsies taken from sextant areas, a total of 12 under ultrasound guidance.  Post-Procedure: - Patient tolerated the procedure well - He was counseled to seek immediate medical attention if experiences any severe pain, significant bleeding, or fevers - Return in one week to discuss biopsy results   [name], MD

## 2020-07-10 ENCOUNTER — Ambulatory Visit: Payer: BC Managed Care – PPO | Admitting: Urology

## 2020-07-10 ENCOUNTER — Other Ambulatory Visit: Payer: Self-pay

## 2020-07-10 VITALS — BP 134/85 | HR 55

## 2020-07-10 DIAGNOSIS — C61 Malignant neoplasm of prostate: Secondary | ICD-10-CM

## 2020-07-10 MED ORDER — LEVOFLOXACIN 500 MG PO TABS
500.0000 mg | ORAL_TABLET | Freq: Once | ORAL | Status: AC
  Administered 2020-07-10: 500 mg via ORAL

## 2020-07-10 MED ORDER — CEFTRIAXONE SODIUM 1 G IJ SOLR
1.0000 g | Freq: Once | INTRAMUSCULAR | Status: AC
  Administered 2020-07-10: 1 g via INTRAMUSCULAR

## 2020-07-12 LAB — SURGICAL PATHOLOGY

## 2020-07-16 ENCOUNTER — Encounter: Payer: Self-pay | Admitting: Urology

## 2020-07-16 ENCOUNTER — Ambulatory Visit: Payer: BC Managed Care – PPO | Admitting: Urology

## 2020-07-16 ENCOUNTER — Other Ambulatory Visit: Payer: Self-pay

## 2020-07-16 VITALS — BP 145/85 | HR 50 | Ht 70.0 in | Wt 168.0 lb

## 2020-07-16 DIAGNOSIS — C61 Malignant neoplasm of prostate: Secondary | ICD-10-CM

## 2020-07-16 NOTE — Progress Notes (Signed)
07/16/2020 12:53 PM   Dale Gray 1955-12-11 [id number]  Referring provider: Margo Common, PA-C [address] [place],  [place] [address]  Chief Complaint  Patient presents with  . Prostate Cancer    Prostate Biopsy results    HPI: 65 year old male with a personal history of elevated/rising PSA/ low risk prostate cancer now with newly diagnosed unfavorable intermediate risk prostate cancer.   Most recent PSA up to 9.3.  No voiding issues.    No [facility] ED.  Newest bx results below.   TRUS vol 57.3 cc.  Rectal exam with nodule at the left mid gland.  Congential dilation of the vasa.       PMH: Past Medical History:  Diagnosis Date  . GERD (gastroesophageal reflux disease)   . Hemorrhoid   . Hyperlipidemia   . Joint pain   . Reflux     Surgical History: Past Surgical History:  Procedure Laterality Date  . ANAL FISSURE REPAIR  2014  . COLONOSCOPY WITH PROPOFOL N/A 03/19/2017   Procedure: COLONOSCOPY WITH PROPOFOL;  Surgeon: Virgel Manifold, MD;  Location: [facility] ENDOSCOPY;  Service: Endoscopy;  Laterality: N/A;  . TYMPANOPLASTY Left 04/30/2014  . VASECTOMY  01/06/2002    Home Medications:  Allergies as of 07/16/2020      Reactions   Lovastatin    Sleep disturbance and extremity pains      Medication List       Accurate as of Jul 16, 2020 12:53 PM. If you have any questions, ask your nurse or doctor.        aspirin 81 MG tablet Take 81 mg by mouth daily.   ezetimibe 10 MG tablet Commonly known as: ZETIA Take 1 tablet (10 mg total) by mouth daily.   fish oil-omega-3 fatty acids 1000 MG capsule Take 2 g by mouth daily.   omeprazole 40 MG capsule Commonly known as: PRILOSEC Take 1 capsule (40 mg total) by mouth daily.   pregabalin 50 MG capsule Commonly known as: LYRICA Take 1 capsule (50 mg total) by mouth 3 (three) times daily.   zolpidem 12.5 MG CR tablet Commonly known as: AMBIEN CR Take 1 tablet (12.5 mg total) by  mouth at bedtime as needed.       Allergies:  Allergies  Allergen Reactions  . Lovastatin     Sleep disturbance and extremity pains    Family History: Family History  Problem Relation Age of Onset  . Heart disease Mother   . Asthma Mother   . Heart disease Father   . CAD Father   . Healthy Sister   . Healthy Brother   . Healthy Daughter   . Healthy Daughter   . Diabetes Maternal Aunt   . Diabetes Maternal Uncle   . Colon cancer Neg Hx     Social History:  reports that he has never smoked. He has never used smokeless tobacco. He reports that he does not drink alcohol and does not use drugs.   Physical Exam: BP (!) 145/85   Pulse (!) 50   Ht 5\' 10"  (1.778 m)   Wt 168 lb (76.2 kg)   BMI 24.11 kg/m   Constitutional:  Alert and oriented, No acute distress. HEENT: [place] AT, moist mucus membranes.  Trachea midline, no masses. Cardiovascular: No clubbing, cyanosis, or edema. Respiratory: Normal respiratory effort, no increased work of breathing. Abdomen: No abdominal pathology or hernia. Skin: No rashes, bruises or suspicious lesions. Neurologic: Grossly intact, no focal deficits, moving all  4 extremities. Psychiatric: Normal mood and affect.  Laboratory Data: Lab Results  Component Value Date   WBC 7.0 08/29/2019   HGB 14.4 08/29/2019   HCT 43.6 08/29/2019   MCV 92 08/29/2019   PLT 310 08/29/2019    Lab Results  Component Value Date   CREATININE 1.07 05/10/2020    Assessment & Plan:    1. Prostate cancer ([place]) Newly diagnosed unfavorable intermediate risk prostate cancer  Given his slightly unusual anatomy with bilateral congenital as dilation as well as unfavorable intermediate risk cancer, I recommended further staging with abdominal CT of the abdomen pelvis as well as bone scan.    The patient was counseled about the natural history of prostate cancer and the standard treatment options that are available for prostate cancer. It was explained to him how  his age and life expectancy, clinical stage, Gleason score, and PSA affect his prognosis, the decision to proceed with additional staging studies, as well as how that information influences recommended treatment strategies. We discussed the roles for active surveillance, radiation therapy, surgical therapy, androgen deprivation, as well as ablative therapy options for the treatment of prostate cancer as appropriate to his individual cancer situation. We discussed the risks and benefits of these options with regard to their impact on cancer control and also in terms of potential adverse events, complications, and impact on quality of life particularly related to urinary, bowel, and sexual function. The patient was encouraged to ask questions throughout the discussion today and all questions were answered to his stated satisfaction. In addition, the patient was provided with and/or directed to appropriate resources and literature for further education about prostate cancer treatment options.  We discussed surgical therapy for prostate cancer including the different available surgical approaches.  Specifically, we discussed robotic prostatectomy with pelvic lymph node dissection based on his restratification.  We discussed, in detail, the risks and expectations of surgery with regard to cancer control, urinary control, and erectile dysfunction as well as expected post operative recovery processed. Additional risks of surgery including but not limited to bleeding, infection, hernia formation, nerve damage, fistula formation, bowel/rectal injury, potentially necessitating colostomy, damage to the urinary tract resulting in urinary leakage, urethral stricture, and cardiopulmonary risk such as myocardial infarction, stroke, death, thromboembolism etc. were explained.   He is most interested in surgery as long as his staging is negative.  He was offered radiation oncology consult today in addition to our discussion but  declined.  We will have him follow-up after his staging for final decision on how would like to proceed pending the results.  - NM Bone Scan Multiple; Future - CT Abdomen Pelvis W Contrast; Future   [name], MD  [facility] [address], [place], [place] [address] [phone]  I spent 40 total minutes on the day of the encounter including pre-visit review of the medical record, face-to-face time with the patient, and post visit ordering of labs/imaging/tests.

## 2020-07-26 ENCOUNTER — Other Ambulatory Visit: Payer: Self-pay | Admitting: Family Medicine

## 2020-08-02 ENCOUNTER — Encounter
Admission: RE | Admit: 2020-08-02 | Discharge: 2020-08-02 | Disposition: A | Discharge status: Home / Self Care | Payer: BC Managed Care – PPO | Source: Ambulatory Visit | Attending: Urology | Admitting: Urology

## 2020-08-02 ENCOUNTER — Other Ambulatory Visit: Payer: Self-pay

## 2020-08-02 DIAGNOSIS — C61 Malignant neoplasm of prostate: Secondary | ICD-10-CM | POA: Diagnosis not present

## 2020-08-02 DIAGNOSIS — Z125 Encounter for screening for malignant neoplasm of prostate: Secondary | ICD-10-CM | POA: Diagnosis not present

## 2020-08-02 MED ORDER — TECHNETIUM TC 99M MEDRONATE IV KIT
20.0000 | PACK | Freq: Once | INTRAVENOUS | Status: AC | PRN
  Administered 2020-08-02: 20.47 via INTRAVENOUS

## 2020-08-07 ENCOUNTER — Encounter: Payer: Self-pay | Admitting: Urology

## 2020-08-07 ENCOUNTER — Ambulatory Visit
Admission: RE | Admit: 2020-08-07 | Discharge: 2020-08-07 | Disposition: A | Discharge status: Home / Self Care | Payer: BC Managed Care – PPO | Source: Ambulatory Visit | Attending: Urology | Admitting: Urology

## 2020-08-07 ENCOUNTER — Other Ambulatory Visit: Payer: Self-pay

## 2020-08-07 ENCOUNTER — Ambulatory Visit (INDEPENDENT_AMBULATORY_CARE_PROVIDER_SITE_OTHER): Payer: BC Managed Care – PPO | Admitting: Urology

## 2020-08-07 VITALS — BP 142/78 | HR 88

## 2020-08-07 DIAGNOSIS — N400 Enlarged prostate without lower urinary tract symptoms: Secondary | ICD-10-CM | POA: Diagnosis not present

## 2020-08-07 DIAGNOSIS — C61 Malignant neoplasm of prostate: Secondary | ICD-10-CM | POA: Diagnosis not present

## 2020-08-07 DIAGNOSIS — N5089 Other specified disorders of the male genital organs: Secondary | ICD-10-CM | POA: Diagnosis not present

## 2020-08-07 DIAGNOSIS — K3189 Other diseases of stomach and duodenum: Secondary | ICD-10-CM | POA: Diagnosis not present

## 2020-08-07 LAB — POCT I-STAT CREATININE: Creatinine, Ser: 1.1 mg/dL (ref 0.61–1.24)

## 2020-08-07 MED ORDER — IOHEXOL 300 MG/ML  SOLN
100.0000 mL | Freq: Once | INTRAMUSCULAR | Status: AC | PRN
  Administered 2020-08-07: 100 mL via INTRAVENOUS

## 2020-08-07 NOTE — Patient Instructions (Signed)
Robot-Assisted Laparoscopic Prostatectomy  Robot-assisted laparoscopic prostatectomy is a minimally invasive procedure to remove the entire prostate, or prostate gland, and the seminal vesicles. The seminal vesicles are near the bladder and the prostate. This procedure is done to treat prostate cancer that has not spread to other parts of the body (has not metastasized). The goals of the procedure are to remove all cancer cells and to prevent prostate cancer from metastasizing. This procedure involves use of a thin, lighted tube with a tiny camera on the end (laparoscope). The laparoscope will allow your surgeon to do the surgery with several small incisions in your abdomen instead of a large incision. Your surgeon will also use robotic arms during the procedure that will be controlled through a computer. During your procedure, the lymph nodes in your pelvis may be removed. Lymph nodes, also called lymph glands, are part of your body's disease-fighting system (immune system). The lymph nodes in the pelvis may be the first place that is affected by the spreading of prostate cancer. If your pelvic lymph nodes are removed, tissue from the nodes will be tested for cancer cells. Tell a health care provider about:  Any allergies you have.  All medicines you are taking, including vitamins, herbs, eye drops, creams, and over-the-counter medicines.  Any problems you or family members have had with anesthetic medicines.  Any blood disorders you have.  Any surgeries you have had.  Any medical conditions you have.  Any prostate infections you have had. What are the risks? Generally, this is a safe procedure. However, problems may occur, including:  Infection.  Bleeding.  Allergic reactions to medicines.  Problems that affect urination or sexual function. These may include: ? Inability to control when you urinate (incontinence). This is usually temporary. ? Narrowing or scarring (stricture) of the  urethra, which is the part that drains urine from your bladder. Stricture may block the flow of urine. ? Inability to get or keep an erection (erectile dysfunction). ? Dry ejaculation. This is when no semen is released during orgasm.  Blockage (obstruction) of the large intestine or small intestine.  Blood clots in the legs.  The formation of a lymphocele. This is a sac (cyst) in the pelvis that is filled with fluid from the lymph nodes.  Damage to nearby structures or organs. What happens before the procedure? Staying hydrated Follow instructions from your health care provider about hydration, which may include:  Up to 2 hours before the procedure - you may continue to drink clear liquids, such as water, clear fruit juice, black coffee, and plain tea.   Medicines Ask your health care provider about:  Changing or stopping your regular medicines. This is especially important if you are taking diabetes medicines or blood thinners.  Taking medicines such as aspirin and ibuprofen. These medicines can thin your blood. Do not take these medicines unless your health care provider tells you to take them.  Taking over-the-counter medicines, vitamins, herbs, and supplements. General instructions  Do not use any products that contain nicotine or tobacco for at least 4 weeks before the procedure. These products include cigarettes, e-cigarettes, and chewing tobacco. If you need help quitting, ask your health care provider.  Plan to have someone take you home from the hospital or clinic.  If you will be going home right after the procedure, plan to have someone with you for 24 hours.  You may have an exam or testing. This may include a CT scan or an MRI.  You  may have a blood or urine sample taken.  Ask your health care provider: ? How your surgery site will be marked. ? What steps will be taken to help prevent infection. These may include:  Removing hair at the surgery site.  Washing  skin with a germ-killing soap.  Taking antibiotic medicine. What happens during the procedure?  An IV will be inserted into one of your veins.  You will be given one or more of the following: ? A medicine to help you relax (sedative). ? A medicine to make you fall asleep (general anesthetic).  A thin, flexible tube (catheter) will be inserted through your urethra and into your bladder. The catheter will drain urine from your bladder during the procedure and while you heal.  Four or five small incisions will be made in your abdomen.  The laparoscope and other surgical instruments will be put through the incisions. Your surgeon will use the laparoscope and a robotic arm to help control the surgical instruments.  Your urethra will be cut and separated from your bladder, and your prostate and seminal vesicles will be removed.  Your pelvic lymph nodes may also be removed for testing.  Your urethra will be reconnected to your bladder. This will be done so your catheter is still in place inside of your urethra.  A small tube (drain) may be placed in one or more of your incisions to help drain extra fluid from your surgical site.  Your incisions will be closed with stitches (sutures), skin glue, or adhesive strips.  Medicine may be applied to your incisions.  Bandages (dressings) will be placed over your incisions. The procedure may vary among health care providers and hospitals. What happens after the procedure?  Your blood pressure, heart rate, breathing rate, and blood oxygen level will be monitored until you leave the hospital or clinic.  You may continue to receive fluids and medicines through an IV.  You will have some pain. You may receive medicines for pain.  Your catheter will remain in place to drain urine from your bladder.  You may have fluid coming from a drain in any incision.  You may have to wear compression stockings. These stockings help to prevent blood clots and  reduce swelling in your legs.  You will be encouraged to move around as much as you can.  If you were given a sedative during the procedure, it can affect you for several hours. Do not drive or operate machinery until your health care provider says that it is safe. Summary  Robot-assisted laparoscopic prostatectomy is a procedure to remove the prostate and other tissue.  Follow instructions from your health care provider about eating and drinking before your surgery.  You will have a catheter in your bladder after your surgery to drain urine. This information is not intended to replace advice given to you by your health care provider. Make sure you discuss any questions you have with your health care provider. Document Revised: 12/29/2018 Document Reviewed: 12/29/2018 Elsevier Patient Education  2021 [employer].

## 2020-08-07 NOTE — Progress Notes (Signed)
08/07/2020 5:31 PM   Royal Vandevoort Kopischke 06/26/1955 [id number]  Referring provider: Margo Common, PA-C [address] [place],  [place] [address]  Chief Complaint  Patient presents with  . Prostate Cancer    HPI: 65 year old male with unfavorable intermediate risk prostate cancer who returns today accompanied by his wife to discuss bone scan and management options.  Please see previous notes for details.  In the interim, he is undergone a bone scan which shows no evidence of metastatic disease.  A CT abdomen pelvis with contrast was ordered but however due to some miscommunication as well as contrast shortage, this has not been performed.   PMH: Past Medical History:  Diagnosis Date  . GERD (gastroesophageal reflux disease)   . Hemorrhoid   . Hyperlipidemia   . Joint pain   . Reflux     Surgical History: Past Surgical History:  Procedure Laterality Date  . ANAL FISSURE REPAIR  2014  . COLONOSCOPY WITH PROPOFOL N/A 03/19/2017   Procedure: COLONOSCOPY WITH PROPOFOL;  Surgeon: Virgel Manifold, MD;  Location: [facility] ENDOSCOPY;  Service: Endoscopy;  Laterality: N/A;  . TYMPANOPLASTY Left 04/30/2014  . VASECTOMY  01/06/2002    Home Medications:  Allergies as of 08/07/2020      Reactions   Lovastatin    Sleep disturbance and extremity pains      Medication List       Accurate as of August 07, 2020  5:31 PM. If you have any questions, ask your nurse or doctor.        aspirin 81 MG tablet Take 81 mg by mouth daily.   ezetimibe 10 MG tablet Commonly known as: ZETIA Take 1 tablet (10 mg total) by mouth daily.   fish oil-omega-3 fatty acids 1000 MG capsule Take 2 g by mouth daily.   omeprazole 40 MG capsule Commonly known as: PRILOSEC Take 1 capsule (40 mg total) by mouth daily.   pregabalin 50 MG capsule Commonly known as: LYRICA Take 1 capsule (50 mg total) by mouth 3 (three) times daily.   zolpidem 12.5 MG CR tablet Commonly known as: AMBIEN  CR Take 1 tablet (12.5 mg total) by mouth at bedtime as needed.       Allergies:  Allergies  Allergen Reactions  . Lovastatin     Sleep disturbance and extremity pains    Family History: Family History  Problem Relation Age of Onset  . Heart disease Mother   . Asthma Mother   . Heart disease Father   . CAD Father   . Healthy Sister   . Healthy Brother   . Healthy Daughter   . Healthy Daughter   . Diabetes Maternal Aunt   . Diabetes Maternal Uncle   . Colon cancer Neg Hx     Social History:  reports that he has never smoked. He has never used smokeless tobacco. He reports that he does not drink alcohol and does not use drugs.   Physical Exam: BP (!) 142/78   Pulse 88   Constitutional:  Alert and oriented, No acute distress. HEENT: [place] AT, moist mucus membranes.  Trachea midline, no masses. Cardiovascular: No clubbing, cyanosis, or edema. Skin: No rashes, bruises or suspicious lesions. Neurologic: Grossly intact, no focal deficits, moving all 4 extremities. Psychiatric: Normal mood and affect.  Pertinent Imaging: CLINICAL DATA:  Prostate cancer screening.  EXAM: NUCLEAR MEDICINE WHOLE BODY BONE SCAN  TECHNIQUE: Whole body anterior and posterior images were obtained approximately 3 hours after intravenous injection  of radiopharmaceutical.  RADIOPHARMACEUTICALS:  20.47 mCi Technetium-32m MDP IV  COMPARISON:  None.  FINDINGS: There are no areas of radiotracer localization to suggest metastatic disease to bone.  Mild degenerative uptake noted at the sternoclavicular joints, knees, wrists and feet/ankles. Renal uptake is symmetric.  IMPRESSION: No evidence of metastatic disease to bone.   Electronically Signed   By: Lajean Manes M.D.   On: 08/04/2020 17:20  Assessment & Plan:    1. Prostate cancer ([place]) Unfavorable intermediate risk prostate cancer, no evidence of metastatic disease on bone scan  Radiology was contacted today and is agreed  to do the CT abdomen pelvis with contrast ASAP later today.  Arrangements were made.  I will call him with these results as I anticipate they will likely be negative based on his statistical probability of metastatic disease.  We again reviewed treatment options.  These were outlined on my previous note, please refer to this.  He previously declined radiation oncology consult and is most interested in surgery.  This continues to be his preference today.  Again, reviewed risks of robotic radical prostatectomy as outlined in previous note.  We discussed the preoperative, postoperative, and intraoperative complications.  He would like to go ahead and book this procedure, radical prostatectomy with bilateral pelvic lymph node dissection.  As long as his CT scan is negative for metastatic disease, we will move forward this surgery.  All questions were answered today.    [name], MD  [facility] [address] [place], [place] [address] [phone]

## 2020-08-08 ENCOUNTER — Telehealth (INDEPENDENT_AMBULATORY_CARE_PROVIDER_SITE_OTHER): Payer: BC Managed Care – PPO | Admitting: Urology

## 2020-08-08 DIAGNOSIS — K3189 Other diseases of stomach and duodenum: Secondary | ICD-10-CM

## 2020-08-08 DIAGNOSIS — C61 Malignant neoplasm of prostate: Secondary | ICD-10-CM

## 2020-08-08 NOTE — Telephone Encounter (Signed)
LMOM, will try to call again tomorrow re: CT scan  [name], MD

## 2020-08-13 ENCOUNTER — Telehealth: Payer: Self-pay | Admitting: Urology

## 2020-08-13 NOTE — Telephone Encounter (Signed)
Pt called asking to speak directly to Dr. [name], pt states he has been told to see a GI specialist but wants to discuss further with Dr. [name] first. Pt would like to know if you would call him

## 2020-08-13 NOTE — Telephone Encounter (Signed)
Spoke with patient regarding GI referral-already a patient of Dr. Allen Norris wanted to verify ok to follow up with him.

## 2020-08-13 NOTE — Telephone Encounter (Signed)
I was able to touch base with the patient this Friday by telephone to discuss CT scan results findings.  Has questionable borderline pelvic lymph node which may present reactive change versus cannot rule out metastatic disease.  Additionally, he has a rectal mass with a fluid type attenuation which likely represents hematoma from biopsy based on his clinical history, location as well as radiographic appearance of the rectal mass.  He also likely has hemorrhage into one of his seminal vesicle cyst but again unable to rule out direct extension of the tumor to the structure albeit unlikely especially given his MRI in 2021 which is normal.  In light of above findings, I do think that based on his age and relatively good health, he remains a good candidate for surgery although may have a slightly higher risk of needing adjuvant therapy such as radiation down the road depending on pathologic findings.  I have also recommended holding off on prostatectomy for about 3 months, repeating restaging imaging just prior to surgery to ensure that the hematoma has resolved and reassess the pelvic lymph node.  He is agreeable with this plan.  Additional findings include incidental gastric thickening.  The patient does note that he has been having slight increase in epigastric pain and loss of appetite.  He is on a PPI.  I recommended gastroenterology referral as he may require endoscopy.

## 2020-08-14 ENCOUNTER — Encounter: Payer: Self-pay | Admitting: *Deleted

## 2020-08-14 ENCOUNTER — Other Ambulatory Visit: Payer: Self-pay

## 2020-08-14 DIAGNOSIS — C61 Malignant neoplasm of prostate: Secondary | ICD-10-CM

## 2020-08-14 DIAGNOSIS — R933 Abnormal findings on diagnostic imaging of other parts of digestive tract: Secondary | ICD-10-CM

## 2020-08-14 MED ORDER — PEG 3350-KCL-NA BICARB-NACL 420 G PO SOLR: ORAL | 0 refills | Status: DC

## 2020-08-19 ENCOUNTER — Encounter: Payer: Self-pay | Admitting: Gastroenterology

## 2020-08-20 ENCOUNTER — Ambulatory Visit: Payer: BC Managed Care – PPO | Admitting: Registered Nurse

## 2020-08-20 ENCOUNTER — Encounter
Admission: RE | Disposition: A | Discharge status: Home / Self Care | Payer: Self-pay | Source: Home / Self Care | Attending: Gastroenterology

## 2020-08-20 ENCOUNTER — Other Ambulatory Visit: Payer: Self-pay

## 2020-08-20 ENCOUNTER — Ambulatory Visit
Admission: RE | Admit: 2020-08-20 | Discharge: 2020-08-20 | Disposition: A | Discharge status: Home / Self Care | Payer: BC Managed Care – PPO | Attending: Gastroenterology | Admitting: Gastroenterology

## 2020-08-20 ENCOUNTER — Encounter: Payer: Self-pay | Admitting: Gastroenterology

## 2020-08-20 DIAGNOSIS — K317 Polyp of stomach and duodenum: Secondary | ICD-10-CM | POA: Diagnosis not present

## 2020-08-20 DIAGNOSIS — Z7982 Long term (current) use of aspirin: Secondary | ICD-10-CM | POA: Insufficient documentation

## 2020-08-20 DIAGNOSIS — Z79899 Other long term (current) drug therapy: Secondary | ICD-10-CM | POA: Insufficient documentation

## 2020-08-20 DIAGNOSIS — D131 Benign neoplasm of stomach: Secondary | ICD-10-CM | POA: Diagnosis not present

## 2020-08-20 DIAGNOSIS — R933 Abnormal findings on diagnostic imaging of other parts of digestive tract: Secondary | ICD-10-CM

## 2020-08-20 DIAGNOSIS — Z888 Allergy status to other drugs, medicaments and biological substances status: Secondary | ICD-10-CM | POA: Insufficient documentation

## 2020-08-20 DIAGNOSIS — C61 Malignant neoplasm of prostate: Secondary | ICD-10-CM | POA: Diagnosis not present

## 2020-08-20 HISTORY — PX: COLONOSCOPY WITH PROPOFOL: SHX5780

## 2020-08-20 HISTORY — PX: ESOPHAGOGASTRODUODENOSCOPY (EGD) WITH PROPOFOL: SHX5813

## 2020-08-20 SURGERY — COLONOSCOPY WITH PROPOFOL
Anesthesia: General

## 2020-08-20 MED ORDER — ONDANSETRON HCL 4 MG/2ML IJ SOLN
4.0000 mg | Freq: Once | INTRAMUSCULAR | Status: AC
  Administered 2020-08-20: 4 mg via INTRAVENOUS

## 2020-08-20 MED ORDER — FENTANYL CITRATE (PF) 100 MCG/2ML IJ SOLN
INTRAMUSCULAR | Status: AC
  Administered 2020-08-20: 50 ug
  Filled 2020-08-20: qty 2

## 2020-08-20 MED ORDER — PROPOFOL 500 MG/50ML IV EMUL
INTRAVENOUS | Status: DC | PRN
  Administered 2020-08-20: 150 ug/kg/min via INTRAVENOUS

## 2020-08-20 MED ORDER — SODIUM CHLORIDE 0.9 % IV SOLN: INTRAVENOUS | Status: DC

## 2020-08-20 MED ORDER — PROPOFOL 10 MG/ML IV BOLUS
INTRAVENOUS | Status: DC | PRN
  Administered 2020-08-20: 70 mg via INTRAVENOUS

## 2020-08-20 MED ORDER — DEXMEDETOMIDINE HCL 200 MCG/2ML IV SOLN
INTRAVENOUS | Status: DC | PRN
  Administered 2020-08-20: 12 ug via INTRAVENOUS

## 2020-08-20 MED ORDER — GLYCOPYRROLATE 0.2 MG/ML IJ SOLN
INTRAMUSCULAR | Status: DC | PRN
  Administered 2020-08-20: .2 mg via INTRAVENOUS

## 2020-08-20 MED ORDER — LIDOCAINE HCL (CARDIAC) PF 100 MG/5ML IV SOSY
PREFILLED_SYRINGE | INTRAVENOUS | Status: DC | PRN
  Administered 2020-08-20: 100 mg via INTRAVENOUS

## 2020-08-20 MED ORDER — SODIUM CHLORIDE (PF) 0.9 % IJ SOLN
PREFILLED_SYRINGE | INTRAMUSCULAR | Status: DC | PRN
  Administered 2020-08-20: 2 mL

## 2020-08-20 MED ORDER — ONDANSETRON HCL 4 MG/2ML IJ SOLN
INTRAMUSCULAR | Status: AC
  Filled 2020-08-20: qty 2

## 2020-08-20 NOTE — H&P (Signed)
Lucilla Lame, MD [address] [place], [place] [address] Phone:[phone] Fax : [phone]  Primary Care Physician:  Margo Common, PA-C Primary Gastroenterologist:  Dr. Allen Norris  Pre-Procedure History & Physical: HPI:  Dale Gray is a 65 y.o. male is here for an endoscopy and colonoscopy.   Past Medical History:  Diagnosis Date   GERD (gastroesophageal reflux disease)    Hemorrhoid    Hyperlipidemia    Joint pain    Reflux     Past Surgical History:  Procedure Laterality Date   ANAL FISSURE REPAIR  2014   COLONOSCOPY WITH PROPOFOL N/A 03/19/2017   Procedure: COLONOSCOPY WITH PROPOFOL;  Surgeon: Virgel Manifold, MD;  Location: [facility] ENDOSCOPY;  Service: Endoscopy;  Laterality: N/A;   TYMPANOPLASTY Left 04/30/2014   VASECTOMY  01/06/2002    Prior to Admission medications   Medication Sig Start Date End Date Taking? Authorizing Provider  aspirin 81 MG tablet Take 81 mg by mouth daily.   Yes [provider]  ezetimibe (ZETIA) 10 MG tablet Take 1 tablet (10 mg total) by mouth daily. 05/10/20  Yes Birdie Sons, MD  fish oil-omega-3 fatty acids 1000 MG capsule Take 2 g by mouth daily.   Yes [provider]  omeprazole (PRILOSEC) 40 MG capsule Take 1 capsule (40 mg total) by mouth daily. 05/10/20  Yes Birdie Sons, MD  polyethylene glycol-electrolytes (GAVILYTE-N WITH FLAVOR PACK) 420 g solution Drink one 8 oz glass every 20 mins until entire container is finished starting at 5:00pm on 08/19/20 08/14/20  Yes Lucilla Lame, MD  pregabalin (LYRICA) 50 MG capsule Take 1 capsule (50 mg total) by mouth 3 (three) times daily. 05/10/20  Yes Birdie Sons, MD  zolpidem (AMBIEN CR) 12.5 MG CR tablet Take 1 tablet (12.5 mg total) by mouth at bedtime as needed. 05/10/20  Yes Birdie Sons, MD    Allergies as of 08/14/2020 - Review Complete 08/07/2020  Allergen Reaction Noted   Lovastatin  01/22/2015    Family History  Problem Relation  Age of Onset   Heart disease Mother    Asthma Mother    Heart disease Father    CAD Father    Healthy Sister    Healthy Brother    Healthy Daughter    Healthy Daughter    Diabetes Maternal Aunt    Diabetes Maternal Uncle    Colon cancer Neg Hx     Social History   Socioeconomic History   Marital status: Married    Spouse name: Not on file   Number of children: Not on file   Years of education: Not on file   Highest education level: Not on file  Occupational History   Not on file  Tobacco Use   Smoking status: Never   Smokeless tobacco: Never  Vaping Use   Vaping Use: Never used  Substance and Sexual Activity   Alcohol use: No   Drug use: No   Sexual activity: Not on file  Other Topics Concern   Not on file  Social History Narrative   Not on file   Social Determinants of Health   Financial Resource Strain: Not on file  Food Insecurity: Not on file  Transportation Needs: Not on file  Physical Activity: Not on file  Stress: Not on file  Social Connections: Not on file  Intimate Partner Violence: Not on file    Review of Systems: See HPI, otherwise negative ROS  Physical Exam: BP  125/79   Pulse (!) 51   Temp 97.6 F (36.4 C) (Temporal)   Resp 17   Ht 5\' 10"  (1.778 m)   Wt 74.4 kg   SpO2 100%   BMI 23.53 kg/m  General:   Alert,  pleasant and cooperative in NAD Head:  Normocephalic and atraumatic. Neck:  Supple; no masses or thyromegaly. Lungs:  Clear throughout to auscultation.    Heart:  Regular rate and rhythm. Abdomen:  Soft, nontender and nondistended. Normal bowel sounds, without guarding, and without rebound.   Neurologic:  Alert and  oriented x4;  grossly normal neurologically.  Impression/Plan: Dale Gray is here for an endoscopy and colonoscopy to be performed for abnormal CT scan  Risks, benefits, limitations, and alternatives regarding  endoscopy and colonoscopy have been reviewed with the patient.  Questions have been answered.   All parties agreeable.   Lucilla Lame, MD  08/20/2020, 10:31 AM

## 2020-08-20 NOTE — Anesthesia Preprocedure Evaluation (Signed)
Anesthesia Evaluation  Patient identified by MRN, date of birth, ID band Patient awake    Reviewed: Allergy & Precautions, NPO status , Patient's Chart, lab work & pertinent test results  History of Anesthesia Complications Negative for: history of anesthetic complications  Airway Mallampati: II  TM Distance: >3 FB Neck ROM: Full    Dental no notable dental hx. (+) Teeth Intact   Pulmonary neg pulmonary ROS, neg sleep apnea, neg COPD, Patient abstained from smoking.Not current smoker,    Pulmonary exam normal breath sounds clear to auscultation       Cardiovascular Exercise Tolerance: Good METS(-) hypertension(-) CAD and (-) Past MI negative cardio ROS  (-) dysrhythmias  Rhythm:Regular Rate:Normal - Systolic murmurs    Neuro/Psych  Neuromuscular disease negative psych ROS   GI/Hepatic GERD  ,(+)     (-) substance abuse  ,   Endo/Other  neg diabetes  Renal/GU negative Renal ROS     Musculoskeletal   Abdominal   Peds  Hematology   Anesthesia Other Findings Past Medical History: No date: GERD (gastroesophageal reflux disease) No date: Hemorrhoid No date: Hyperlipidemia No date: Joint pain No date: Reflux  Reproductive/Obstetrics                             Anesthesia Physical Anesthesia Plan  ASA: 2  Anesthesia Plan: General   Post-op Pain Management:    Induction: Intravenous  PONV Risk Score and Plan: 2 and Ondansetron, Propofol infusion and TIVA  Airway Management Planned: Nasal Cannula  Additional Equipment: None  Intra-op Plan:   Post-operative Plan:   Informed Consent: I have reviewed the patients History and Physical, chart, labs and discussed the procedure including the risks, benefits and alternatives for the proposed anesthesia with the patient or authorized representative who has indicated his/her understanding and acceptance.     Dental advisory  given  Plan Discussed with: CRNA and Surgeon  Anesthesia Plan Comments: (Discussed risks of anesthesia with patient, including possibility of difficulty with spontaneous ventilation under anesthesia necessitating airway intervention, PONV, and rare risks such as cardiac or respiratory or neurological events. Patient understands.)        Anesthesia Quick Evaluation

## 2020-08-20 NOTE — Anesthesia Postprocedure Evaluation (Signed)
Anesthesia Post Note  Patient: Dale Gray  Procedure(s) Performed: COLONOSCOPY WITH PROPOFOL ESOPHAGOGASTRODUODENOSCOPY (EGD) WITH PROPOFOL  Patient location during evaluation: Endoscopy Anesthesia Type: General Level of consciousness: awake and alert Pain management: pain level controlled Vital Signs Assessment: post-procedure vital signs reviewed and stable Respiratory status: spontaneous breathing, nonlabored ventilation, respiratory function stable and patient connected to nasal cannula oxygen Cardiovascular status: blood pressure returned to baseline and stable Postop Assessment: no apparent nausea or vomiting Anesthetic complications: no   No notable events documented.   Last Vitals:  Vitals:   08/20/20 1110 08/20/20 1120  BP: (!) 143/94 (!) 144/92  Pulse: (!) 52 (!) 46  Resp: 14 14  Temp:    SpO2: 99% 100%    Last Pain:  Vitals:   08/20/20 1100  TempSrc: Temporal  PainSc:                  Arita Miss

## 2020-08-20 NOTE — OR Nursing (Signed)
PT WITH ABDOMINAL PAIN 7.POST EGD. RN SPOKE TO DR Bertell Maria WHO ORDERED FENTANYL 50MG  IV ONCE. RN ADM FENTANYL 50 IV ONCE SLOWLY. O2 PROVIDED AT 2LITERS. PT NOW ASLEEP

## 2020-08-20 NOTE — Transfer of Care (Signed)
Immediate Anesthesia Transfer of Care Note  Patient: Dale Gray  Procedure(s) Performed: COLONOSCOPY WITH PROPOFOL ESOPHAGOGASTRODUODENOSCOPY (EGD) WITH PROPOFOL  Patient Location: Endoscopy Unit  Anesthesia Type:General  Level of Consciousness: drowsy  Airway & Oxygen Therapy: Patient Spontanous Breathing  Post-op Assessment: Report given to RN and Post -op Vital signs reviewed and stable  Post vital signs: Reviewed and stable  Last Vitals:  Vitals Value Taken Time  BP 139/89 08/20/20 1105  Temp    Pulse 55 08/20/20 1106  Resp 13 08/20/20 1106  SpO2 99 % 08/20/20 1106  Vitals shown include unvalidated device data.  Last Pain:  Vitals:   08/20/20 0957  TempSrc: Temporal  PainSc: 0-No pain         Complications: No notable events documented.

## 2020-08-20 NOTE — Op Note (Addendum)
[facility] Gastroenterology Patient Name: Jobie Popp Procedure Date: 08/20/2020 10:36 AM MRN: [id number] Account #: [id number] Date of Birth: [birth year] Admit Type: Outpatient Age: 65 Room: [facility] ENDO ROOM 4 Gender: Male Note Status: Finalized Procedure:             Colonoscopy Indications:           Abnormal CT of the GI tract Providers:             Lucilla Lame MD, MD Referring MD:          No Local Md, MD (Referring MD) Medicines:             Propofol per Anesthesia Complications:         No immediate complications. Procedure:             Pre-Anesthesia Assessment:                        - Prior to the procedure, a History and Physical was                         performed, and patient medications and allergies were                         reviewed. The patient's tolerance of previous                         anesthesia was also reviewed. The risks and benefits                         of the procedure and the sedation options and risks                         were discussed with the patient. All questions were                         answered, and informed consent was obtained. Prior                         Anticoagulants: The patient has taken no previous                         anticoagulant or antiplatelet agents. ASA Grade                         Assessment: II - A patient with mild systemic disease.                         After reviewing the risks and benefits, the patient                         was deemed in satisfactory condition to undergo the                         procedure.                        After obtaining informed consent, the colonoscope was  passed under direct vision. Throughout the procedure,                         the patient's blood pressure, pulse, and oxygen                         saturations were monitored continuously. The                         Colonoscope was introduced through the anus and                          advanced to the the cecum, identified by appendiceal                         orifice and ileocecal valve. The colonoscopy was                         performed without difficulty. The patient tolerated                         the procedure well. The quality of the bowel                         preparation was excellent. Findings:      The perianal and digital rectal examinations were normal.      The colon (entire examined portion) appeared normal. Impression:            - The entire examined colon is normal.                        - No specimens collected. Recommendation:        - Discharge patient to home.                        - Resume previous diet.                        - Continue present medications.                        - Repeat colonoscopy in 10 years for surveillance. Procedure Code(s):     --- Professional ---                        [phone], Colonoscopy, flexible; diagnostic, including                         collection of specimen(s) by brushing or washing, when                         performed (separate procedure) Diagnosis Code(s):     --- Professional ---                        R93.3, Abnormal findings on diagnostic imaging of                         other parts of digestive tract CPT copyright 2019 American Medical Association. All rights reserved. The codes documented in this report  are preliminary and upon coder review may  be revised to meet current compliance requirements. Lucilla Lame MD, MD 08/20/2020 11:03:13 AM This report has been signed electronically. Number of Addenda: 0 Note Initiated On: 08/20/2020 10:36 AM Scope Withdrawal Time: 0 hours 5 minutes 24 seconds  Total Procedure Duration: 0 hours 7 minutes 33 seconds  Estimated Blood Loss:  Estimated blood loss: none.      [facility]

## 2020-08-20 NOTE — OR Nursing (Signed)
PT OOB TO BATHROOM FOR 20 MINUTES. FINALLY PASSING A SMALL AMOUNT OF GAS. I/S TO DRINK FLUIDS FOR NEXT 4 HOURS ,EAT SALTINES ONLY AND TAKE GAS X OR TUMS TO START PASSING GAS. D/C TO HOME VIA W/C AT 1315

## 2020-08-20 NOTE — Op Note (Signed)
[facility] Gastroenterology Patient Name: Dale Gray Procedure Date: 08/20/2020 10:37 AM MRN: [id number] Account #: [id number] Date of Birth: 1955-06-03 Admit Type: Outpatient Age: 65 Room: [facility] ENDO ROOM 4 Gender: Male Note Status: Finalized Procedure:             Upper GI endoscopy Indications:           Abnormal CT of the GI tract Providers:             Lucilla Lame MD, MD Referring MD:          No Local Md, MD (Referring MD) Medicines:             Propofol per Anesthesia Complications:         No immediate complications. Procedure:             Pre-Anesthesia Assessment:                        - Prior to the procedure, a History and Physical was                         performed, and patient medications and allergies were                         reviewed. The patient's tolerance of previous                         anesthesia was also reviewed. The risks and benefits                         of the procedure and the sedation options and risks                         were discussed with the patient. All questions were                         answered, and informed consent was obtained. Prior                         Anticoagulants: The patient has taken no previous                         anticoagulant or antiplatelet agents. ASA Grade                         Assessment: II - A patient with mild systemic disease.                         After reviewing the risks and benefits, the patient                         was deemed in satisfactory condition to undergo the                         procedure.                        After obtaining informed consent, the endoscope was  passed under direct vision. Throughout the procedure,                         the patient's blood pressure, pulse, and oxygen                         saturations were monitored continuously. The Endoscope                         was introduced through the mouth, and  advanced to the                         second part of duodenum. The upper GI endoscopy was                         accomplished without difficulty. The patient tolerated                         the procedure well. Findings:      The examined esophagus was normal.      Multiple pedunculated and sessile polyps with no bleeding and no       stigmata of recent bleeding were found in the gastric fundus and in the       gastric body. Area was successfully injected with 2 mL of a 1:10,000       solution of epinephrine for hemostasis. The polyp was removed with a hot       snare. Resection and retrieval were complete. To prevent bleeding       post-intervention, one hemostatic clip was successfully placed (MR       conditional). There was no bleeding at the end of the procedure.      The examined duodenum was normal. Impression:            - Normal esophagus.                        - Multiple gastric polyps. Resected and retrieved.                         Injected. Clip (MR conditional) was placed.                        - Normal examined duodenum. Recommendation:        - Discharge patient to home.                        - Resume previous diet.                        - Continue present medications.                        - Perform a colonoscopy today.                        - Await pathology results. Procedure Code(s):     --- Professional ---                        [phone], Esophagogastroduodenoscopy, flexible,  transoral; with removal of tumor(s), polyp(s), or                         other lesion(s) by snare technique Diagnosis Code(s):     --- Professional ---                        R93.3, Abnormal findings on diagnostic imaging of                         other parts of digestive tract                        K31.7, Polyp of stomach and duodenum CPT copyright 2019 American Medical Association. All rights reserved. The codes documented in this report are preliminary  and upon coder review may  be revised to meet current compliance requirements. Lucilla Lame MD, MD 08/20/2020 10:51:56 AM This report has been signed electronically. Number of Addenda: 0 Note Initiated On: 08/20/2020 10:37 AM Estimated Blood Loss:  Estimated blood loss: none.      [facility]

## 2020-08-21 ENCOUNTER — Encounter: Payer: Self-pay | Admitting: Gastroenterology

## 2020-08-22 LAB — SURGICAL PATHOLOGY

## 2020-08-26 ENCOUNTER — Encounter: Payer: Self-pay | Admitting: Gastroenterology

## 2020-08-30 ENCOUNTER — Telehealth: Payer: Self-pay | Admitting: Urology

## 2020-08-30 NOTE — Telephone Encounter (Signed)
Just to clarify, CT to be done like ~2 weeks before surgery  [name], MD

## 2020-08-30 NOTE — Telephone Encounter (Signed)
Patient called the office this morning and left a voice mail message requesting a call back from Dr. Cherrie Gauze CMA, Adria Devon.    I returned the patient's call to see if the need was urgent.  Patient stated that it is not urgent, just needs to talk to [facility].  Please contact patient to discuss.

## 2020-08-30 NOTE — Telephone Encounter (Signed)
Patient called to inform he had upper endoscopy and colonoscopy done on 08/20/20. He would like to get imaging completed and surgery booked ASAP

## 2020-08-30 NOTE — Telephone Encounter (Signed)
Please see previous phone notes.    As per my recommendations in early June (see phone notes) I want a wait at least 3 months to do his surgery.  I ordered a CT of the pelvis and then surgery should follow in mid September as long as the CT looks okay.  I believe I discussed this with Leah as well but I do not see that the surgery is booked.  Leah, with the status of this?

## 2020-09-02 ENCOUNTER — Telehealth: Payer: Self-pay

## 2020-09-02 ENCOUNTER — Other Ambulatory Visit: Payer: Self-pay | Admitting: Family Medicine

## 2020-09-02 DIAGNOSIS — F5101 Primary insomnia: Secondary | ICD-10-CM

## 2020-09-02 MED ORDER — ZOLPIDEM TARTRATE ER 12.5 MG PO TBCR: 12.5000 mg | EXTENDED_RELEASE_TABLET | Freq: Every evening | ORAL | 0 refills | Status: DC | PRN

## 2020-09-02 NOTE — Telephone Encounter (Signed)
Please advise 

## 2020-09-02 NOTE — Telephone Encounter (Signed)
Requesting one week supply of zolpidem 12.5 called in to [facility] due to medication changed over to mail in and will not receive medication for another week.    Patient is out of medication

## 2020-09-02 NOTE — Progress Notes (Signed)
Given 10 day supply to local pharmacy since mail order will take a couple weeks to get his prescription to him.

## 2020-09-05 ENCOUNTER — Other Ambulatory Visit: Payer: Self-pay | Admitting: *Deleted

## 2020-09-05 DIAGNOSIS — C61 Malignant neoplasm of prostate: Secondary | ICD-10-CM

## 2020-10-14 ENCOUNTER — Other Ambulatory Visit: Payer: Self-pay | Admitting: Urology

## 2020-10-14 DIAGNOSIS — C61 Malignant neoplasm of prostate: Secondary | ICD-10-CM

## 2020-10-15 ENCOUNTER — Ambulatory Visit: Payer: BC Managed Care – PPO | Admitting: Gastroenterology

## 2020-10-23 ENCOUNTER — Other Ambulatory Visit: Payer: Self-pay

## 2020-10-23 ENCOUNTER — Encounter: Payer: Self-pay | Admitting: Physical Therapy

## 2020-10-23 ENCOUNTER — Ambulatory Visit: Payer: BC Managed Care – PPO | Attending: Urology | Admitting: Physical Therapy

## 2020-10-23 DIAGNOSIS — R293 Abnormal posture: Secondary | ICD-10-CM | POA: Insufficient documentation

## 2020-10-23 DIAGNOSIS — R278 Other lack of coordination: Secondary | ICD-10-CM | POA: Diagnosis not present

## 2020-10-23 NOTE — Therapy (Signed)
[facility] [address] [place], [place], [address] Phone: [phone]   Fax:  [phone]  Physical Therapy Evaluation  Patient Details  Name: Dale Gray MRN: [id number] Date of Birth: 1955/09/28 Referring Provider (PT): [name]   Encounter Date: 10/23/2020   PT End of Session - 10/23/20 1623     Visit Number 1    Number of Visits 6    Date for PT Re-Evaluation 12/04/20    PT Start Time 1630    PT Stop Time 1710    PT Time Calculation (min) 40 min    Activity Tolerance Patient tolerated treatment well    Behavior During Therapy [facility] for tasks assessed/performed             Past Medical History:  Diagnosis Date   GERD (gastroesophageal reflux disease)    Hemorrhoid    Hyperlipidemia    Joint pain    Reflux     Past Surgical History:  Procedure Laterality Date   ANAL FISSURE REPAIR  2014   COLONOSCOPY WITH PROPOFOL N/A 03/19/2017   Procedure: COLONOSCOPY WITH PROPOFOL;  Surgeon: Virgel Manifold, MD;  Location: [facility] ENDOSCOPY;  Service: Endoscopy;  Laterality: N/A;   COLONOSCOPY WITH PROPOFOL N/A 08/20/2020   Procedure: COLONOSCOPY WITH PROPOFOL;  Surgeon: Lucilla Lame, MD;  Location: [facility] ENDOSCOPY;  Service: Endoscopy;  Laterality: N/A;   ESOPHAGOGASTRODUODENOSCOPY (EGD) WITH PROPOFOL N/A 08/20/2020   Procedure: ESOPHAGOGASTRODUODENOSCOPY (EGD) WITH PROPOFOL;  Surgeon: Lucilla Lame, MD;  Location: [facility] ENDOSCOPY;  Service: Endoscopy;  Laterality: N/A;   TYMPANOPLASTY Left 04/30/2014   TYMPANOPLASTY  10/2014   VASECTOMY  01/06/2002    There were no vitals filed for this visit.        [facility] PT Assessment - 10/23/20 1829       Assessment   Medical Diagnosis Prostate Cancer    Referring Provider (PT) [name]    Onset Date/Surgical Date 12/02/20    Hand Dominance Right    Prior Therapy None for this dx      Balance Screen   Has the patient fallen in the past 6 months No             [facility] PHYSICAL THERAPY EVALUATION  Precautions: Prostate Cancer   SUBJECTIVE  Chief Complaint: Patient denies any urinary symptoms; notes cancer was caught via PSA levels from bloodwork. He has no complaints. Later notes slow stream with occasional post-micturition dribble.  Pertinent History:   Falls Negative. Scoliosis Negative. Pulmonary disease/dysfunction Negative.  Surgical history: Positive for see above.  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  Urological History: Prostatectomy: Yes Date: Scheduled for 12/02/2020  HOLEP: Negative  TURP: Negative  PSA: 9.3; last measured 06/11/2020   Urinary History: Incontinence: Negative.  Fluid Intake: 4 x16.9 oz H20, 1 Dr Malachi Bonds, body armor, pomegranate juice.  Nocturia: 0x/night Frequency of urination: every 3 hours Pain with urination: Negative  Difficulty initiating urination: Negative ; notes slow stream Intermittent stream: Positive  Post-micturition dribble: Positive for sometimes (< 25%).   Frequent UTI: Negative.  Gastrointestinal History: Patient denies any concerns about bowel movements and notes no changes from baseline.  Location of pain: not applicable  Current activities: farm; horses  Patient Goals: does not state any specific goals; postural control and  bladder control for horseback riding   OBJECTIVE  Mental Status Patient is oriented to person, place and time. Recent memory is intact. Remote memory is intact. Attention span and concentration are intact. Expressive speech is intact. Patient's fund of knowledge is within normal limits for educational level.  POSTURE/OBSERVATIONS:  Lumbar lordosis: diminished Thoracic kyphosis: WNL Iliac  crest height: equal bilaterally  Lumbar lateral shift: negative  Pelvic obliquity: negative Leg length discrepancy: negative  GAIT: Limited pelvic rotation with stride; generally stiff posture.  SENSATION: Grossly intact to light touch bilateral LEs as determined by testing dermatomes L2-S2 Proprioception and hot/cold testing deferred on this date  RANGE OF MOTION:    LEFT RIGHT  Lumbar forward flexion (65):  WNL    Lumbar extension (30): WNL    Lumbar lateral flexion (25):  WNL WNL  Thoracic and Lumbar rotation (30 degrees):    WNL WNL  Hip Flexion (0-125):   WNL WNL  Hip IR (0-45):  WNL WNL  Hip ER (0-45):  WNL WNL  Hip Abduction (0-40):  WNL WNL    STRENGTH: MMT   RLE LLE  Hip Flexion 5 5  Hip Extension 5 5  Hip Abduction  5 5  Hip Adduction  5 5  Hip ER  5 5  Hip IR  5 5  Knee Extension 5 5  Knee Flexion 5 5  Dorsiflexion  5 5  Plantarflexion (seated) 5 5   ABDOMINAL:  Palpation: no TTP Diastasis: < 2 fingers throughout lineal alba, depth to DIP Rib flare: none noted  SPECIAL TESTS: SLR (SN 92, -LR 0.29): R: Negative L: Negative FABER (SN 81): R: Negative L: Negative  FADDIR (SN 94): R: Negative L: Negative    PHYSICAL PERFORMANCE MEASURES:  STS: WNL  EXTERNAL PELVIC EXAM: deferred 2/2 to time constraints  Palpation: Breath coordination: Perineal Mobility Testing: Voluntary Contraction: present/absent Relaxation: present/delayed/ non-relaxing Perineal movement with sustained increase in IAP ("bear down"):  Perineal movement with rapid increase in IAP ("cough"):  DIGITAL RECTAL EXAM: deferred 2/2 to time constraints  Strength (PERF): Symmetry: Palpation: Prolapse:  OUTCOME MEASURES: IPSS (12, QoL 3)  ASSESSMENT Patient is a 65 year old presenting to clinic with chief complaints of intermittent stream and straining to urinate in the context of prostate cancer diagnosis. Patient is tentatively scheduled for prostatectomy 12/02/2020 and was referred  for assessment of PFM strength and function. Today's evaluation is suggestive of deficits in posture, IAP management, PFM strength, PFM coordination as evidenced by intermittent stream "almost always" as well as weak stream and straining to urinate, diminished lumbar lordosis, and generally stiff lower spine during gait. Due to the high physical demand of patient's occupation, a thorough assessment of his body mechanics and pressure management strategies will be necessary at upcoming visit and prior to return to work s/p surgical intervention. Patient's responses on IPSS outcome measures (12, QoL 3) indicate moderate symptoms. Patient's progress may be limited due to the high physical demand of his occupation; however, patient's motivation is advantageous. Patient was able to achieve basic understanding of deep core and PFM functions during today's evaluation and responded positively to educational interventions. Patient will benefit from continued skilled therapeutic intervention to address deficits in posture, IAP management, PFM strength, PFM coordination in order to increase function and improve overall QOL.  EDUCATION Patient educated on prognosis, POC, and provided with HEP including: diaphragmatic breathing, TrA bracing. Patient articulated understanding and returned demonstration. Patient will benefit from further education in order to maximize compliance  and understanding for long-term therapeutic gains.  TREATMENT  Neuromuscular Re-education: Patient educated on primary functions of the pelvic floor including: posture/balance, sexual pleasure, storage and elimination of waste from the body, abdominal cavity closure, and breath coordination.     Objective measurements completed on examination: See above findings.         PT Long Term Goals - 10/24/20 1620       PT LONG TERM GOAL #1   Title Patient will demonstrate independence with HEP in order to maximize therapeutic gains and  improve carryover from physical therapy sessions to ADLs in the home and community.    Baseline IE: provided    Time 6    Period Weeks    Status New    Target Date 12/04/20      PT LONG TERM GOAL #2   Title Patient will demonstrate independent and coordinated diaphragmatic breathing in supine with a 1:2 breathing pattern for improved down-regulation of the nervous system and improved management of intra-abdominal pressures in order to increase function at home and in the community.    Baseline IE: not demonstrated    Time 6    Period Weeks    Status New    Target Date 12/04/20      PT LONG TERM GOAL #3   Title Patient will demonstrate improved strength and coordination of TrA as evidenced by ability to perform [facility] exercises through phase 5 as well as proper lifting mechanics for object weighing 25 lbs to minimize SUI s/p surgical intervention.    Baseline IE: not demonstrated    Time 6    Period Weeks    Status New    Target Date 12/04/20      PT LONG TERM GOAL #4   Title Patient will demonstrate circumferential and sequential contraction of >4/5 MMT, > 6 sec hold x10 and 5 consecutive quick flicks with </= 10 min rest between testing bouts, and relaxation of the PFM coordinated with breath for improved management of intra-abdominal pressure and normal bowel and bladder function without the presence of pain nor incontinence in order to improve participation at home and in the community.    Baseline IE: not demonstrated    Time 6    Period Weeks    Status New    Target Date 12/04/20                    Plan - 10/23/20 1623     Clinical Impression Statement Patient is a 65 year old presenting to clinic with chief complaints of intermittent stream and straining to urinate in the context of prostate cancer diagnosis. Patient is tentatively scheduled for prostatectomy 12/02/2020 and was referred for assessment of PFM strength and function. Today's  evaluation is suggestive of deficits in posture, IAP management, PFM strength, PFM coordination as evidenced by intermittent stream "almost always" as well as weak stream and straining to urinate, diminished lumbar lordosis, and generally stiff lower spine during gait. Due to the high physical demand of patient's occupation, a thorough assessment of his body mechanics and pressure management strategies will be necessary at upcoming visit and prior to return to work s/p surgical intervention. Patient's responses on IPSS outcome measures (12, QoL 3) indicate moderate symptoms. Patient's progress may be limited due to the high physical demand of his occupation; however, patient's motivation is advantageous. Patient was able to achieve basic understanding of deep core and PFM functions during today's evaluation and responded positively to  educational interventions. Patient will benefit from continued skilled therapeutic intervention to address deficits in posture, IAP management, PFM strength, PFM coordination in order to increase function and improve overall QOL.    Personal Factors and Comorbidities Comorbidity 3+;Age;Behavior Pattern    Comorbidities GERD, prostate cancer, anal fissure, insomnia, anemia, neuropathy    Examination-Activity Limitations Toileting    Examination-Participation Restrictions Occupation    Stability/Clinical Decision Making Evolving/Moderate complexity    Clinical Decision Making Moderate    Rehab Potential Good    PT Frequency 1x / week    PT Duration 6 weeks    PT Treatment/Interventions Cryotherapy;Electrical Stimulation;Moist Heat;Therapeutic exercise;Neuromuscular re-education;Patient/family education;Manual techniques;Taping;Joint Manipulations;Spinal Manipulations    PT Next Visit Plan deep core, PFM basics    PT Home Exercise Plan Access Code: [email or web address]    Consulted and Agree with Plan of Care Patient             Patient will benefit from skilled therapeutic  intervention in order to improve the following deficits and impairments:  Postural dysfunction, Improper body mechanics, Decreased coordination, Decreased strength, Decreased endurance  Visit Diagnosis: Abnormal posture  Other lack of coordination     Problem List Patient Active Problem List   Diagnosis Date Noted   Abnormal findings on diagnostic imaging of digestive system    Gastric polyp    Neck pain 12/29/2017   Cervical radiculitis 09/23/2017   DDD (degenerative disc disease), cervical 09/23/2017   Special screening for malignant neoplasms, colon    Personal history of colonic polyps    Benign neoplasm of ascending colon    Peripheral neuropathy 01/24/2015   Anal fissure 01/22/2015   Disorder of coccyx 01/22/2015   History of digestive disease 01/22/2015   HLD (hyperlipidemia) 01/22/2015   Anemia, iron deficiency 01/22/2015   Yeast dermatitis 06/22/2012   Thrombosed hemorrhoids    Fam hx-ischem heart disease 07/20/2007   Acid reflux 05/26/2006   Cannot sleep 10/12/2005   Hypercholesterolemia without hypertriglyceridemia 10/12/2005    Myles Gip PT, DPT [phone]  10/24/2020, 4:22 PM  [place] [facility] [address] [place], [place], [address] Phone: [phone]   Fax:  [phone]  Name: Dale Gray MRN: [id number] Date of Birth: 1955/08/02

## 2020-10-30 ENCOUNTER — Ambulatory Visit: Payer: BC Managed Care – PPO | Admitting: Physical Therapy

## 2020-10-30 ENCOUNTER — Other Ambulatory Visit: Payer: Self-pay

## 2020-10-30 ENCOUNTER — Encounter: Payer: Self-pay | Admitting: Physical Therapy

## 2020-10-30 DIAGNOSIS — R293 Abnormal posture: Secondary | ICD-10-CM | POA: Diagnosis not present

## 2020-10-30 DIAGNOSIS — R278 Other lack of coordination: Secondary | ICD-10-CM | POA: Diagnosis not present

## 2020-10-31 NOTE — Therapy (Signed)
[facility] [facility] [address] [place], [place], [address] Phone: [phone]   Fax:  [phone]  Physical Therapy Treatment  Patient Details  Name: Dale Gray MRN: [id number] Date of Birth: June 27, 1955 Referring Provider (PT): [name]   Encounter Date: 10/30/2020   PT End of Session - 10/30/20 1758     Visit Number 2    Number of Visits 6    Date for PT Re-Evaluation 12/04/20    PT Start Time 1800    PT Stop Time 1830    PT Time Calculation (min) 30 min    Activity Tolerance Patient tolerated treatment well    Behavior During Therapy [facility] for tasks assessed/performed             Past Medical History:  Diagnosis Date   GERD (gastroesophageal reflux disease)    Hemorrhoid    Hyperlipidemia    Joint pain    Reflux     Past Surgical History:  Procedure Laterality Date   ANAL FISSURE REPAIR  2014   COLONOSCOPY WITH PROPOFOL N/A 03/19/2017   Procedure: COLONOSCOPY WITH PROPOFOL;  Surgeon: Virgel Manifold, MD;  Location: [facility] ENDOSCOPY;  Service: Endoscopy;  Laterality: N/A;   COLONOSCOPY WITH PROPOFOL N/A 08/20/2020   Procedure: COLONOSCOPY WITH PROPOFOL;  Surgeon: Lucilla Lame, MD;  Location: [facility] ENDOSCOPY;  Service: Endoscopy;  Laterality: N/A;   ESOPHAGOGASTRODUODENOSCOPY (EGD) WITH PROPOFOL N/A 08/20/2020   Procedure: ESOPHAGOGASTRODUODENOSCOPY (EGD) WITH PROPOFOL;  Surgeon: Lucilla Lame, MD;  Location: [facility] ENDOSCOPY;  Service: Endoscopy;  Laterality: N/A;   TYMPANOPLASTY Left 04/30/2014   TYMPANOPLASTY  10/2014   VASECTOMY  01/06/2002    There were no vitals filed for this visit.   Subjective Assessment - 10/30/20 1757     Subjective Patient notes that he has continued to work long hours, but has fit in deep core exercises with driving. Denies any other concerns or changes.    Currently in Pain? No/denies            TREATMENT Pre-treatment assessment: EXTERNAL PELVIC EXAM:  Breath  coordination: present Perineal Mobility Testing: Voluntary Contraction: present; palpable squeeze and lift Relaxation: present Perineal movement with sustained increase in IAP ("bear down"): perineal descent Perineal movement with rapid increase in IAP ("cough"): no perineal movement  Neuromuscular Re-education: Patient education on male pelvic anatomy, PFM function, and basics of PFM strengthening and progression.  Sidelying diaphragmatic breathing with VCs and TCs for downregulation of the nervous system and improved management of IAP Sidelying, PFM contractions (endurance 2 sec before TrA dominant pattern; fast twitch x3). VCs and TCs to decrease compensatory patterns and encourage activation of the PFM. Seated TrA activation with PFM contraction. VCs and TCs to decrease compensatory patterns.    Patient educated throughout session on appropriate technique and form using multi-modal cueing, HEP, and activity modification. Patient articulated understanding and returned demonstration.  Patient Response to interventions: Comfortable to work independently on deep core and PFM until prior to surgery.  ASSESSMENT Patient presents to clinic with excellent motivation to participate in therapy. Patient demonstrates deficits in posture, IAP management, PFM strength, PFM coordination. Patient able to achieve strong PFM contraction with minimal cueing for 3 consecutive reps during today's session and responded positively to educational interventions. Patient will benefit from continued skilled therapeutic intervention to address remaining deficits in posture, IAP management, PFM strength, PFM coordination in order to increase function and improve overall QOL.          PT  Long Term Goals - 10/24/20 1620       PT LONG TERM GOAL #1   Title Patient will demonstrate independence with HEP in order to maximize therapeutic gains and improve carryover from physical therapy sessions to ADLs in the  home and community.    Baseline IE: provided    Time 6    Period Weeks    Status New    Target Date 12/04/20      PT LONG TERM GOAL #2   Title Patient will demonstrate independent and coordinated diaphragmatic breathing in supine with a 1:2 breathing pattern for improved down-regulation of the nervous system and improved management of intra-abdominal pressures in order to increase function at home and in the community.    Baseline IE: not demonstrated    Time 6    Period Weeks    Status New    Target Date 12/04/20      PT LONG TERM GOAL #3   Title Patient will demonstrate improved strength and coordination of TrA as evidenced by ability to perform Sahrmann Abdominal Rehabilitation exercises through phase 5 as well as proper lifting mechanics for object weighing 25 lbs to minimize SUI s/p surgical intervention.    Baseline IE: not demonstrated    Time 6    Period Weeks    Status New    Target Date 12/04/20      PT LONG TERM GOAL #4   Title Patient will demonstrate circumferential and sequential contraction of >4/5 MMT, > 6 sec hold x10 and 5 consecutive quick flicks with </= 10 min rest between testing bouts, and relaxation of the PFM coordinated with breath for improved management of intra-abdominal pressure and normal bowel and bladder function without the presence of pain nor incontinence in order to improve participation at home and in the community.    Baseline IE: not demonstrated    Time 6    Period Weeks    Status New    Target Date 12/04/20                   Plan - 10/30/20 1759     Clinical Impression Statement Patient presents to clinic with excellent motivation to participate in therapy. Patient demonstrates deficits in posture, IAP management, PFM strength, PFM coordination. Patient able to achieve strong PFM contraction with minimal cueing for 3 consecutive reps during today's session and responded positively to educational interventions. Patient will benefit  from continued skilled therapeutic intervention to address remaining deficits in posture, IAP management, PFM strength, PFM coordination in order to increase function and improve overall QOL.    Personal Factors and Comorbidities Comorbidity 3+;Age;Behavior Pattern    Comorbidities GERD, prostate cancer, anal fissure, insomnia, anemia, neuropathy    Examination-Activity Limitations Toileting    Examination-Participation Restrictions Occupation    Stability/Clinical Decision Making Evolving/Moderate complexity    Rehab Potential Good    PT Frequency 1x / week    PT Duration 6 weeks    PT Treatment/Interventions Cryotherapy;Electrical Stimulation;Moist Heat;Therapeutic exercise;Neuromuscular re-education;Patient/family education;Manual techniques;Taping;Joint Manipulations;Spinal Manipulations    PT Next Visit Plan deep core, PFM basics    PT Home Exercise Plan Access Code: [email or web address]    Consulted and Agree with Plan of Care Patient             Patient will benefit from skilled therapeutic intervention in order to improve the following deficits and impairments:  Postural dysfunction, Improper body mechanics, Decreased coordination, Decreased strength, Decreased endurance  Visit Diagnosis: Abnormal posture  Other lack of coordination     Problem List Patient Active Problem List   Diagnosis Date Noted   Abnormal findings on diagnostic imaging of digestive system    Gastric polyp    Neck pain 12/29/2017   Cervical radiculitis 09/23/2017   DDD (degenerative disc disease), cervical 09/23/2017   Special screening for malignant neoplasms, colon    Personal history of colonic polyps    Benign neoplasm of ascending colon    Peripheral neuropathy 01/24/2015   Anal fissure 01/22/2015   Disorder of coccyx 01/22/2015   History of digestive disease 01/22/2015   HLD (hyperlipidemia) 01/22/2015   Anemia, iron deficiency 01/22/2015   Yeast dermatitis 06/22/2012   Thrombosed hemorrhoids     Fam hx-ischem heart disease 07/20/2007   Acid reflux 05/26/2006   Cannot sleep 10/12/2005   Hypercholesterolemia without hypertriglyceridemia 10/12/2005    Myles Gip PT, DPT [phone]  10/31/2020, 1:56 PM  [place] [address] [place], [place], [address] Phone: [phone]   Fax:  [phone]  Name: JHADIEL EGGETT MRN: [id number] Date of Birth: 1955-09-01

## 2020-11-18 ENCOUNTER — Other Ambulatory Visit: Payer: Self-pay

## 2020-11-18 ENCOUNTER — Ambulatory Visit
Admission: RE | Admit: 2020-11-18 | Discharge: 2020-11-18 | Disposition: A | Discharge status: Home / Self Care | Payer: BC Managed Care – PPO | Source: Ambulatory Visit | Attending: Urology | Admitting: Urology

## 2020-11-18 DIAGNOSIS — N5089 Other specified disorders of the male genital organs: Secondary | ICD-10-CM | POA: Diagnosis not present

## 2020-11-18 DIAGNOSIS — N402 Nodular prostate without lower urinary tract symptoms: Secondary | ICD-10-CM | POA: Diagnosis not present

## 2020-11-18 DIAGNOSIS — C61 Malignant neoplasm of prostate: Secondary | ICD-10-CM | POA: Diagnosis not present

## 2020-11-18 LAB — POCT I-STAT CREATININE: Creatinine, Ser: 1.1 mg/dL (ref 0.61–1.24)

## 2020-11-18 MED ORDER — IOHEXOL 350 MG/ML SOLN
100.0000 mL | Freq: Once | INTRAVENOUS | Status: AC | PRN
  Administered 2020-11-18: 100 mL via INTRAVENOUS

## 2020-11-19 ENCOUNTER — Telehealth: Payer: Self-pay | Admitting: Urology

## 2020-11-19 NOTE — Telephone Encounter (Signed)
CT pelvis reviewed.  Also reviewed with radiologist who feels that there is no rectal pathology and no lymphadenopathy which is reassuring.  Colonoscopy also negative.   Call the patient to discuss with him.  Plan to move forward as scheduled for prostatectomy.  All questions answered.  [name], MD

## 2020-11-21 ENCOUNTER — Other Ambulatory Visit: Payer: Self-pay

## 2020-11-21 ENCOUNTER — Other Ambulatory Visit: Payer: BC Managed Care – PPO

## 2020-11-21 ENCOUNTER — Encounter
Admission: RE | Admit: 2020-11-21 | Discharge: 2020-11-21 | Disposition: A | Discharge status: Home / Self Care | Payer: BC Managed Care – PPO | Source: Ambulatory Visit | Attending: Urology | Admitting: Urology

## 2020-11-21 DIAGNOSIS — C61 Malignant neoplasm of prostate: Secondary | ICD-10-CM

## 2020-11-21 HISTORY — DX: Nausea with vomiting, unspecified: R11.2

## 2020-11-21 HISTORY — DX: Malignant (primary) neoplasm, unspecified: C80.1

## 2020-11-21 HISTORY — DX: Other specified postprocedural states: Z98.890

## 2020-11-21 HISTORY — DX: Other complications of anesthesia, initial encounter: T88.59XA

## 2020-11-21 NOTE — Patient Instructions (Signed)
Your procedure is scheduled on:12-02-20 Monday Report to the Registration Desk on the 1st floor of the [facility].Then proceed to the 2nd floor Surgery Desk in the [facility] To find out your arrival time, please call [phone] between 1PM - 3PM on:11-29-20 Friday  REMEMBER: Instructions that are not followed completely may result in serious medical risk, up to and including death; or upon the discretion of your surgeon and anesthesiologist your surgery may need to be rescheduled.  Do not eat food OR drink any liquids after midnight the night before surgery.  No gum chewing, lozengers or hard candies.  TAKE THESE MEDICATIONS THE MORNING OF SURGERY WITH A SIP OF WATER: -Ezetimibe (Zetia) -Pregabalin (Lyrica) -Omeprazole (Prilosec)-take one the night before and one on the morning of surgery - helps to prevent nausea after surgery.)  81 mg Aspirin is to be stopped 7 days prior to surgery as instructed by Dr Cherrie Gauze office-Last dose on 11-24-20 Sunday  One week prior to surgery: Stop Anti-inflammatories (NSAIDS) such as Advil, Aleve, Ibuprofen, Motrin, Naproxen, Naprosyn and Aspirin based products such as Excedrin, Goodys Powder, BC Powder.You may however, continue to take Tylenol if needed for pain up until the day of surgery.  Stop ANY OVER THE COUNTER supplements/vitamins 7 days prior to surgery (Multivitamin and Fish Oil)  No Alcohol for 24 hours before or after surgery.  No Smoking including e-cigarettes for 24 hours prior to surgery.  No chewable tobacco products for at least 6 hours prior to surgery.  No nicotine patches on the day of surgery.  Do not use any "recreational" drugs for at least a week prior to your surgery.  Please be advised that the combination of cocaine and anesthesia may have negative outcomes, up to and including death. If you test positive for cocaine, your surgery will be cancelled.  On the morning of surgery brush your teeth with toothpaste and  water, you may rinse your mouth with mouthwash if you wish. Do not swallow any toothpaste or mouthwash.  Use CHG Soap as directed on instruction sheet.  Do not wear jewelry, make-up, hairpins, clips or nail polish.  Do not wear lotions, powders, or perfumes.   Do not shave body from the neck down 48 hours prior to surgery just in case you cut yourself which could leave a site for infection.  Also, freshly shaved skin may become irritated if using the CHG soap.  Contact lenses, hearing aids and dentures may not be worn into surgery.  Do not bring valuables to the hospital. [facility] is not responsible for any missing/lost belongings or valuables.   Notify your doctor if there is any change in your medical condition (cold, fever, infection).  Wear comfortable clothing (specific to your surgery type) to the hospital.  After surgery, you can help prevent lung complications by doing breathing exercises.  Take deep breaths and cough every 1-2 hours. Your doctor may order a device called an Incentive Spirometer to help you take deep breaths. When coughing or sneezing, hold a pillow firmly against your incision with both hands. This is called "splinting." Doing this helps protect your incision. It also decreases belly discomfort.  If you are being admitted to the hospital overnight, leave your suitcase in the car. After surgery it may be brought to your room.  If you are being discharged the day of surgery, you will not be allowed to drive home. You will need a responsible adult (18 years or older) to drive you home and  stay with you that night.   If you are taking public transportation, you will need to have a responsible adult (18 years or older) with you. Please confirm with your physician that it is acceptable to use public transportation.   Please call the [facility] at [phone] if you have any questions about these instructions.  Surgery Visitation  Policy:  Patients undergoing a surgery or procedure may have one family member or support person with them as long as that person is not COVID-19 positive or experiencing its symptoms.  That person may remain in the waiting area during the procedure.  Inpatient Visitation:    Visiting hours are 7 a.m. to 8 p.m. Inpatients will be allowed two visitors daily. The visitors may change each day during the patient's stay. No visitors under the age of 55. Any visitor under the age of 34 must be accompanied by an adult. The visitor must pass COVID-19 screenings, use hand sanitizer when entering and exiting the patient's room and wear a mask at all times, including in the patient's room. Patients must also wear a mask when staff or their visitor are in the room. Masking is required regardless of vaccination status.

## 2020-11-22 ENCOUNTER — Other Ambulatory Visit: Payer: BC Managed Care – PPO

## 2020-11-25 ENCOUNTER — Ambulatory Visit: Payer: BC Managed Care – PPO | Admitting: Dermatology

## 2020-11-27 ENCOUNTER — Other Ambulatory Visit: Payer: Self-pay

## 2020-11-27 ENCOUNTER — Ambulatory Visit: Payer: BC Managed Care – PPO | Attending: Urology | Admitting: Physical Therapy

## 2020-11-27 DIAGNOSIS — R293 Abnormal posture: Secondary | ICD-10-CM | POA: Diagnosis not present

## 2020-11-27 DIAGNOSIS — R278 Other lack of coordination: Secondary | ICD-10-CM | POA: Insufficient documentation

## 2020-11-28 ENCOUNTER — Encounter
Admission: RE | Admit: 2020-11-28 | Discharge: 2020-11-28 | Disposition: A | Discharge status: Home / Self Care | Payer: BC Managed Care – PPO | Source: Ambulatory Visit | Attending: Urology | Admitting: Urology

## 2020-11-28 ENCOUNTER — Other Ambulatory Visit: Payer: BC Managed Care – PPO

## 2020-11-28 DIAGNOSIS — Z01812 Encounter for preprocedural laboratory examination: Secondary | ICD-10-CM | POA: Insufficient documentation

## 2020-11-28 DIAGNOSIS — Z20822 Contact with and (suspected) exposure to covid-19: Secondary | ICD-10-CM | POA: Insufficient documentation

## 2020-11-28 LAB — BASIC METABOLIC PANEL
Anion gap: 11 (ref 5–15)
BUN: 18 mg/dL (ref 8–23)
CO2: 26 mmol/L (ref 22–32)
Calcium: 9.4 mg/dL (ref 8.9–10.3)
Chloride: 101 mmol/L (ref 98–111)
Creatinine, Ser: 1.24 mg/dL (ref 0.61–1.24)
GFR, Estimated: >60 mL/min (ref >60)
Glucose, Bld: 139 mg/dL — ABNORMAL HIGH (ref 70–99)
Potassium: 3.6 mmol/L (ref 3.5–5.1)
Sodium: 138 mmol/L (ref 135–145)

## 2020-11-28 LAB — TYPE AND SCREEN
ABO/RH(D): O POS
Antibody Screen: NEGATIVE

## 2020-11-28 LAB — CBC
HCT: 43.1 % (ref 39.0–52.0)
Hemoglobin: 14.5 g/dL (ref 13.0–17.0)
MCH: 30.3 pg (ref 26.0–34.0)
MCHC: 33.6 g/dL (ref 30.0–36.0)
MCV: 90.2 fL (ref 80.0–100.0)
Platelets: 304 10*3/uL (ref 150–400)
RBC: 4.78 MIL/uL (ref 4.22–5.81)
RDW: 13 % (ref 11.5–15.5)
WBC: 5.6 10*3/uL (ref 4.0–10.5)
nRBC: 0 % (ref 0.0–0.2)

## 2020-11-28 LAB — SARS CORONAVIRUS 2 (TAT 6-24 HRS): SARS Coronavirus 2: NEGATIVE

## 2020-11-28 LAB — PROTIME-INR
INR: 1 (ref 0.8–1.2)
Prothrombin Time: 13.4 seconds (ref 11.4–15.2)

## 2020-11-28 NOTE — Patient Instructions (Signed)
After Surgery: Prostatectomy  Water Intake You should be drinking at least 8 glasses of water to decrease the irritants in the bladder and to promote healing. Reduce the number of bladder irritants (for example: caffeinated beverages, carbonated beverages, and acidic juices) 0-1 glasses per day.  Catheter After surgery a catheter will stay in for 7-10 days. During this time, it is important to practice your belly breathing. Do not perform pelvic floor muscle contractions with the catheter in place.  When to call the doctor !! Blood in urine!! !! Increased pain in right leg at night!!  Bed positioning Use pillows to support right hip and knee in sidelying.  Tips for managing pain Use an ice pack over the perineum for 10 minutes, 3 times per day. Be sure to use fabric like a washcloth between the ice pack and your skin to prevent skin irritation.  Incontinence Products Adult briefs (disposable)  Penile Clamp- clamp you place on the penis that is cushioned to stop the flow of urine  Incontinence pads (disposable)  Men's acticuf- pouch that goes around penis to manage light to moderate incontinence  When to start exercises After the catheter is removed, you can begin to perform the attached exercises as prescribed. The first phase is fast-twitch muscle fiber and will last initially about 3 weeks. The second phase will also incorporate slow-twitch muscle fibers and will be practiced in more upright postures.   Once you are training both fast and slow, you will continue to practice daily pelvic floor muscle contractions to decrease the risk of persistent incontinence. Research shows that men participating in consistent pelvic floor muscle strengthening after prostatectomy have significantly improved continence at 12 weeks post-op compared to those that did not participate (Milios, et al. 2019). It is reasonable to suspect that men consistently practicing pelvic floor muscle contractions  beyond 12 weeks post-op have continued and maintained improvement in urinary leakage and overall activity tolerance.  Resources Conquering Incontinence: A New and Physical Approach to a Freer Lifestyle by Chancy Hurter

## 2020-11-28 NOTE — Therapy (Signed)
[facility] [address] [place], [place], [address] Phone: [phone]   Fax:  [phone]  Physical Therapy Treatment  Patient Details  Name: Dale Gray MRN: [id number] Date of Birth: December 13, 1955 Referring Provider (PT): [name]   Encounter Date: 11/27/2020   PT End of Session - 11/28/20 1007     Visit Number 3    Number of Visits 6    Date for PT Re-Evaluation 12/04/20    PT Start Time 1750    PT Stop Time 0626    PT Time Calculation (min) 40 min    Activity Tolerance Patient tolerated treatment well    Behavior During Therapy [facility] for tasks assessed/performed             Past Medical History:  Diagnosis Date   Cancer ([place])    prostate   Complication of anesthesia    hard time waking up   GERD (gastroesophageal reflux disease)    Hemorrhoid    Hyperlipidemia    Joint pain    PONV (postoperative nausea and vomiting)    after 1st ear surgery   Reflux     Past Surgical History:  Procedure Laterality Date   ANAL FISSURE REPAIR  2014   CARPAL TUNNEL RELEASE Right    and elbow surgery   COLONOSCOPY WITH PROPOFOL N/A 03/19/2017   Procedure: COLONOSCOPY WITH PROPOFOL;  Surgeon: Virgel Manifold, MD;  Location: [facility] ENDOSCOPY;  Service: Endoscopy;  Laterality: N/A;   COLONOSCOPY WITH PROPOFOL N/A 08/20/2020   Procedure: COLONOSCOPY WITH PROPOFOL;  Surgeon: Lucilla Lame, MD;  Location: [facility] ENDOSCOPY;  Service: Endoscopy;  Laterality: N/A;   ESOPHAGOGASTRODUODENOSCOPY (EGD) WITH PROPOFOL N/A 08/20/2020   Procedure: ESOPHAGOGASTRODUODENOSCOPY (EGD) WITH PROPOFOL;  Surgeon: Lucilla Lame, MD;  Location: [facility] ENDOSCOPY;  Service: Endoscopy;  Laterality: N/A;   TYMPANOPLASTY Left 04/30/2014   TYMPANOPLASTY  10/2014   VASECTOMY  01/06/2002    There were no vitals filed for this visit.   Subjective Assessment - 11/28/20 1006     Subjective Patient reports that he has continued to work on his home  exercise program daily. He has been doing both deep core and pelvic floor contractions. Denies any significant concerns, but does note that he has been feeling more fatigued atthe end of the day.    Currently in Pain? No/denies            TREATMENT Neuromuscular Re-education: Patient education on bed positioning, water intake, bladder irritants, safe initial exercises when catheter is present, and phase 1 strengthening exercises once catheter is removed.    Patient educated throughout session on appropriate technique and form using multi-modal cueing, HEP, and activity modification. Patient articulated understanding and returned demonstration.  Patient Response to interventions: Comfortable to follow up after catheter is removed.  ASSESSMENT Patient presents to clinic with excellent motivation to participate in therapy. Patient demonstrates deficits in posture, IAP management, PFM strength, PFM coordination. Patient able to achieve understanding of post-surgical course of care with respect to PFPT during today's session and responded positively to educational interventions. Patient will benefit from continued skilled therapeutic intervention s/p RP to address remaining deficits in posture, IAP management, PFM strength, PFM coordination in order to increase function and improve overall QOL.    PT Long Term Goals - 10/24/20 1620       PT LONG TERM GOAL #1   Title Patient will demonstrate independence with HEP in order to maximize therapeutic gains and improve carryover from  physical therapy sessions to ADLs in the home and community.    Baseline IE: provided    Time 6    Period Weeks    Status New    Target Date 12/04/20      PT LONG TERM GOAL #2   Title Patient will demonstrate independent and coordinated diaphragmatic breathing in supine with a 1:2 breathing pattern for improved down-regulation of the nervous system and improved management of intra-abdominal pressures in order to  increase function at home and in the community.    Baseline IE: not demonstrated    Time 6    Period Weeks    Status New    Target Date 12/04/20      PT LONG TERM GOAL #3   Title Patient will demonstrate improved strength and coordination of TrA as evidenced by ability to perform Sahrmann Abdominal Rehabilitation exercises through phase 5 as well as proper lifting mechanics for object weighing 25 lbs to minimize SUI s/p surgical intervention.    Baseline IE: not demonstrated    Time 6    Period Weeks    Status New    Target Date 12/04/20      PT LONG TERM GOAL #4   Title Patient will demonstrate circumferential and sequential contraction of >4/5 MMT, > 6 sec hold x10 and 5 consecutive quick flicks with </= 10 min rest between testing bouts, and relaxation of the PFM coordinated with breath for improved management of intra-abdominal pressure and normal bowel and bladder function without the presence of pain nor incontinence in order to improve participation at home and in the community.    Baseline IE: not demonstrated    Time 6    Period Weeks    Status New    Target Date 12/04/20                   Plan - 11/28/20 1008     Clinical Impression Statement Patient presents to clinic with excellent motivation to participate in therapy. Patient demonstrates deficits in posture, IAP management, PFM strength, PFM coordination. Patient able to achieve understanding of post-surgical course of care with respect to PFPT during today's session and responded positively to educational interventions. Patient will benefit from continued skilled therapeutic intervention s/p RP to address remaining deficits in posture, IAP management, PFM strength, PFM coordination in order to increase function and improve overall QOL.    Personal Factors and Comorbidities Comorbidity 3+;Age;Behavior Pattern    Comorbidities GERD, prostate cancer, anal fissure, insomnia, anemia, neuropathy    Examination-Activity  Limitations Toileting    Examination-Participation Restrictions Occupation    Stability/Clinical Decision Making Evolving/Moderate complexity    Rehab Potential Good    PT Frequency 1x / week    PT Duration 6 weeks    PT Treatment/Interventions Cryotherapy;Electrical Stimulation;Moist Heat;Therapeutic exercise;Neuromuscular re-education;Patient/family education;Manual techniques;Taping;Joint Manipulations;Spinal Manipulations    PT Next Visit Plan deep core, PFM basics    PT Home Exercise Plan Access Code: [email or web address]    Consulted and Agree with Plan of Care Patient             Patient will benefit from skilled therapeutic intervention in order to improve the following deficits and impairments:  Postural dysfunction, Improper body mechanics, Decreased coordination, Decreased strength, Decreased endurance  Visit Diagnosis: Abnormal posture  Other lack of coordination     Problem List Patient Active Problem List   Diagnosis Date Noted   Abnormal findings on diagnostic imaging of digestive system    Gastric polyp  Neck pain 12/29/2017   Cervical radiculitis 09/23/2017   DDD (degenerative disc disease), cervical 09/23/2017   Special screening for malignant neoplasms, colon    Personal history of colonic polyps    Benign neoplasm of ascending colon    Peripheral neuropathy 01/24/2015   Anal fissure 01/22/2015   Disorder of coccyx 01/22/2015   History of digestive disease 01/22/2015   HLD (hyperlipidemia) 01/22/2015   Anemia, iron deficiency 01/22/2015   Yeast dermatitis 06/22/2012   Thrombosed hemorrhoids    Fam hx-ischem heart disease 07/20/2007   Acid reflux 05/26/2006   Cannot sleep 10/12/2005   Hypercholesterolemia without hypertriglyceridemia 10/12/2005    Myles Gip PT, DPT [id number]  11/28/2020, 10:19 AM  [facility] [address] [place], [place], [address] Phone: [phone]   Fax:  [phone]  Name:  Dale Gray MRN: [id number] Date of Birth: [birth year]

## 2020-11-29 ENCOUNTER — Telehealth: Payer: Self-pay | Admitting: *Deleted

## 2020-11-29 ENCOUNTER — Other Ambulatory Visit: Payer: Self-pay | Admitting: Family Medicine

## 2020-11-29 DIAGNOSIS — F5101 Primary insomnia: Secondary | ICD-10-CM

## 2020-11-29 LAB — CULTURE, URINE COMPREHENSIVE

## 2020-11-29 MED ORDER — CEPHALEXIN 500 MG PO CAPS: 500.0000 mg | ORAL_CAPSULE | Freq: Three times a day (TID) | ORAL | 0 refills | Status: DC

## 2020-11-29 MED ORDER — ZOLPIDEM TARTRATE ER 12.5 MG PO TBCR: 12.5000 mg | EXTENDED_RELEASE_TABLET | Freq: Every evening | ORAL | 0 refills | Status: DC | PRN

## 2020-11-29 NOTE — Telephone Encounter (Addendum)
Left patient a VM with details, sent in RX to [facility].   ----- Message from [name], MD sent at 11/29/2020  1:43 PM EDT ----- This patient grow very low colony count of bacteria but I like to treat this just as a precaution.  Please start him on Keflex 3 times a day and give him a 5-day course to start today.  [name], MD

## 2020-12-02 ENCOUNTER — Encounter: Payer: Self-pay | Admitting: Urology

## 2020-12-02 ENCOUNTER — Observation Stay
Admission: RE | Admit: 2020-12-02 | Discharge: 2020-12-03 | Disposition: A | Discharge status: Home / Self Care | Payer: BC Managed Care – PPO | Attending: Urology | Admitting: Urology

## 2020-12-02 ENCOUNTER — Ambulatory Visit: Payer: BC Managed Care – PPO | Admitting: Anesthesiology

## 2020-12-02 ENCOUNTER — Other Ambulatory Visit: Payer: Self-pay

## 2020-12-02 ENCOUNTER — Encounter
Admission: RE | Disposition: A | Discharge status: Home / Self Care | Payer: Self-pay | Source: Home / Self Care | Attending: Urology

## 2020-12-02 DIAGNOSIS — Z419 Encounter for procedure for purposes other than remedying health state, unspecified: Secondary | ICD-10-CM

## 2020-12-02 DIAGNOSIS — Z23 Encounter for immunization: Secondary | ICD-10-CM | POA: Insufficient documentation

## 2020-12-02 DIAGNOSIS — C61 Malignant neoplasm of prostate: Secondary | ICD-10-CM | POA: Diagnosis not present

## 2020-12-02 DIAGNOSIS — Z7982 Long term (current) use of aspirin: Secondary | ICD-10-CM | POA: Diagnosis not present

## 2020-12-02 DIAGNOSIS — N5089 Other specified disorders of the male genital organs: Secondary | ICD-10-CM | POA: Diagnosis not present

## 2020-12-02 HISTORY — PX: ROBOT ASSISTED LAPAROSCOPIC RADICAL PROSTATECTOMY: SHX5141

## 2020-12-02 HISTORY — PX: PELVIC LYMPH NODE DISSECTION: SHX6543

## 2020-12-02 LAB — ABO/RH: ABO/RH(D): O POS

## 2020-12-02 SURGERY — PROSTATECTOMY, RADICAL, ROBOT-ASSISTED, LAPAROSCOPIC
Anesthesia: General

## 2020-12-02 MED ORDER — APREPITANT 40 MG PO CAPS
ORAL_CAPSULE | ORAL | Status: AC
  Administered 2020-12-02: 40 mg via ORAL
  Filled 2020-12-02: qty 1

## 2020-12-02 MED ORDER — FENTANYL CITRATE (PF) 250 MCG/5ML IJ SOLN
INTRAMUSCULAR | Status: AC
  Filled 2020-12-02: qty 5

## 2020-12-02 MED ORDER — HEPARIN SODIUM (PORCINE) 5000 UNIT/ML IJ SOLN
5000.0000 [IU] | Freq: Three times a day (TID) | INTRAMUSCULAR | Status: DC
  Administered 2020-12-02 – 2020-12-03 (×3): 5000 [IU] via SUBCUTANEOUS
  Filled 2020-12-02 (×3): qty 1

## 2020-12-02 MED ORDER — DOCUSATE SODIUM 100 MG PO CAPS
100.0000 mg | ORAL_CAPSULE | Freq: Two times a day (BID) | ORAL | Status: DC
  Administered 2020-12-02 – 2020-12-03 (×3): 100 mg via ORAL
  Filled 2020-12-02 (×3): qty 1

## 2020-12-02 MED ORDER — OXYCODONE HCL 5 MG/5ML PO SOLN: 5.0000 mg | Freq: Once | ORAL | Status: AC | PRN

## 2020-12-02 MED ORDER — PROPOFOL 10 MG/ML IV BOLUS
INTRAVENOUS | Status: DC | PRN
  Administered 2020-12-02: 200 mg via INTRAVENOUS

## 2020-12-02 MED ORDER — APREPITANT 40 MG PO CAPS: 40.0000 mg | ORAL_CAPSULE | Freq: Once | ORAL | Status: AC

## 2020-12-02 MED ORDER — CHLORHEXIDINE GLUCONATE 0.12 % MT SOLN
OROMUCOSAL | Status: AC
  Filled 2020-12-02: qty 15

## 2020-12-02 MED ORDER — OXYBUTYNIN CHLORIDE 5 MG PO TABS: 5.0000 mg | ORAL_TABLET | Freq: Three times a day (TID) | ORAL | Status: DC | PRN

## 2020-12-02 MED ORDER — EZETIMIBE 10 MG PO TABS
10.0000 mg | ORAL_TABLET | Freq: Every day | ORAL | Status: DC
  Administered 2020-12-02 – 2020-12-03 (×2): 10 mg via ORAL
  Filled 2020-12-02 (×2): qty 1

## 2020-12-02 MED ORDER — BELLADONNA ALKALOIDS-OPIUM 16.2-60 MG RE SUPP: 1.0000 | Freq: Four times a day (QID) | RECTAL | Status: DC | PRN

## 2020-12-02 MED ORDER — CEFAZOLIN SODIUM-DEXTROSE 2-4 GM/100ML-% IV SOLN
INTRAVENOUS | Status: AC
  Filled 2020-12-02: qty 100

## 2020-12-02 MED ORDER — SEVOFLURANE IN SOLN
RESPIRATORY_TRACT | Status: AC
  Filled 2020-12-02: qty 250

## 2020-12-02 MED ORDER — PROPOFOL 10 MG/ML IV BOLUS
INTRAVENOUS | Status: AC
  Filled 2020-12-02: qty 60

## 2020-12-02 MED ORDER — INFLUENZA VAC SPLIT QUAD 0.5 ML IM SUSY
0.5000 mL | PREFILLED_SYRINGE | INTRAMUSCULAR | Status: AC
  Administered 2020-12-03: 0.5 mL via INTRAMUSCULAR
  Filled 2020-12-02: qty 0.5

## 2020-12-02 MED ORDER — GLYCOPYRROLATE 0.2 MG/ML IJ SOLN
INTRAMUSCULAR | Status: AC
  Filled 2020-12-02: qty 1

## 2020-12-02 MED ORDER — FENTANYL CITRATE (PF) 100 MCG/2ML IJ SOLN
INTRAMUSCULAR | Status: AC
  Administered 2020-12-02: 25 ug via INTRAVENOUS
  Filled 2020-12-02: qty 2

## 2020-12-02 MED ORDER — SODIUM CHLORIDE 0.9 % IV SOLN: INTRAVENOUS | Status: DC

## 2020-12-02 MED ORDER — DIPHENHYDRAMINE HCL 12.5 MG/5ML PO ELIX
12.5000 mg | ORAL_SOLUTION | Freq: Four times a day (QID) | ORAL | Status: DC | PRN
  Filled 2020-12-02: qty 5

## 2020-12-02 MED ORDER — THROMBIN 5000 UNITS EX SOLR
CUTANEOUS | Status: DC | PRN
  Administered 2020-12-02: 5000 [IU] via TOPICAL

## 2020-12-02 MED ORDER — ASPIRIN EC 81 MG PO TBEC
81.0000 mg | DELAYED_RELEASE_TABLET | Freq: Every day | ORAL | Status: DC
  Administered 2020-12-02 – 2020-12-03 (×2): 81 mg via ORAL
  Filled 2020-12-02 (×2): qty 1

## 2020-12-02 MED ORDER — MIDAZOLAM HCL 2 MG/2ML IJ SOLN
INTRAMUSCULAR | Status: AC
  Filled 2020-12-02: qty 2

## 2020-12-02 MED ORDER — DIPHENHYDRAMINE HCL 50 MG/ML IJ SOLN: 12.5000 mg | Freq: Four times a day (QID) | INTRAMUSCULAR | Status: DC | PRN

## 2020-12-02 MED ORDER — ACETAMINOPHEN 10 MG/ML IV SOLN
INTRAVENOUS | Status: AC
  Filled 2020-12-02: qty 100

## 2020-12-02 MED ORDER — LIDOCAINE HCL (PF) 2 % IJ SOLN
INTRAMUSCULAR | Status: AC
  Filled 2020-12-02: qty 5

## 2020-12-02 MED ORDER — CEPHALEXIN 500 MG PO CAPS
500.0000 mg | ORAL_CAPSULE | Freq: Three times a day (TID) | ORAL | Status: DC
  Administered 2020-12-02 – 2020-12-03 (×4): 500 mg via ORAL
  Filled 2020-12-02 (×4): qty 1

## 2020-12-02 MED ORDER — PROMETHAZINE HCL 25 MG/ML IJ SOLN
6.2500 mg | INTRAMUSCULAR | Status: DC | PRN
Start: 2020-12-02 — End: 2020-12-02

## 2020-12-02 MED ORDER — BUPIVACAINE HCL 0.5 % IJ SOLN
INTRAMUSCULAR | Status: DC | PRN
  Administered 2020-12-02: 20 mL
  Administered 2020-12-02: 10 mL

## 2020-12-02 MED ORDER — MORPHINE SULFATE (PF) 2 MG/ML IV SOLN: 2.0000 mg | INTRAVENOUS | Status: DC | PRN

## 2020-12-02 MED ORDER — HEMOSTATIC AGENTS (NO CHARGE) OPTIME
TOPICAL | Status: DC | PRN
  Administered 2020-12-02: 1 via TOPICAL

## 2020-12-02 MED ORDER — ONDANSETRON HCL 4 MG/2ML IJ SOLN
4.0000 mg | INTRAMUSCULAR | Status: DC | PRN
  Administered 2020-12-02: 4 mg via INTRAVENOUS
  Filled 2020-12-02: qty 2

## 2020-12-02 MED ORDER — DEXAMETHASONE SODIUM PHOSPHATE 10 MG/ML IJ SOLN
INTRAMUSCULAR | Status: DC | PRN
  Administered 2020-12-02: 10 mg via INTRAVENOUS

## 2020-12-02 MED ORDER — STERILE WATER FOR IRRIGATION IR SOLN
Status: DC | PRN
  Administered 2020-12-02: 250 mL

## 2020-12-02 MED ORDER — DROPERIDOL 2.5 MG/ML IJ SOLN
0.6250 mg | Freq: Once | INTRAMUSCULAR | Status: DC | PRN
  Filled 2020-12-02: qty 2

## 2020-12-02 MED ORDER — BUPIVACAINE HCL (PF) 0.5 % IJ SOLN
INTRAMUSCULAR | Status: AC
  Filled 2020-12-02: qty 30

## 2020-12-02 MED ORDER — ORAL CARE MOUTH RINSE: 15.0000 mL | Freq: Once | OROMUCOSAL | Status: AC

## 2020-12-02 MED ORDER — OXYCODONE HCL 5 MG PO TABS
5.0000 mg | ORAL_TABLET | Freq: Once | ORAL | Status: AC | PRN
Start: 2020-12-02 — End: 2020-12-02
  Administered 2020-12-02: 5 mg via ORAL

## 2020-12-02 MED ORDER — CEFAZOLIN SODIUM-DEXTROSE 2-4 GM/100ML-% IV SOLN
2.0000 g | INTRAVENOUS | Status: AC
  Administered 2020-12-02: 2 g via INTRAVENOUS

## 2020-12-02 MED ORDER — OXYCODONE HCL 5 MG PO TABS
ORAL_TABLET | ORAL | Status: AC
  Filled 2020-12-02: qty 1

## 2020-12-02 MED ORDER — ACETAMINOPHEN 10 MG/ML IV SOLN
1000.0000 mg | Freq: Once | INTRAVENOUS | Status: DC | PRN
  Administered 2020-12-02: 1000 mg via INTRAVENOUS

## 2020-12-02 MED ORDER — ACETAMINOPHEN 325 MG PO TABS: 650.0000 mg | ORAL_TABLET | ORAL | Status: DC | PRN

## 2020-12-02 MED ORDER — ONDANSETRON HCL 4 MG/2ML IJ SOLN
INTRAMUSCULAR | Status: DC | PRN
  Administered 2020-12-02: 4 mg via INTRAVENOUS

## 2020-12-02 MED ORDER — EPHEDRINE 5 MG/ML INJ
INTRAVENOUS | Status: AC
  Filled 2020-12-02: qty 5

## 2020-12-02 MED ORDER — ROCURONIUM BROMIDE 100 MG/10ML IV SOLN
INTRAVENOUS | Status: DC | PRN
  Administered 2020-12-02 (×2): 20 mg via INTRAVENOUS
  Administered 2020-12-02: 60 mg via INTRAVENOUS
  Administered 2020-12-02: 20 mg via INTRAVENOUS

## 2020-12-02 MED ORDER — EPHEDRINE SULFATE 50 MG/ML IJ SOLN
INTRAMUSCULAR | Status: DC | PRN
  Administered 2020-12-02: 10 mg via INTRAVENOUS
  Administered 2020-12-02: 5 mg via INTRAVENOUS
  Administered 2020-12-02: 10 mg via INTRAVENOUS

## 2020-12-02 MED ORDER — PANTOPRAZOLE SODIUM 40 MG PO TBEC
80.0000 mg | DELAYED_RELEASE_TABLET | Freq: Every day | ORAL | Status: DC
  Administered 2020-12-03: 80 mg via ORAL
  Filled 2020-12-02: qty 2

## 2020-12-02 MED ORDER — LACTATED RINGERS IR SOLN
Status: DC | PRN
  Administered 2020-12-02: 1000 mL

## 2020-12-02 MED ORDER — PREGABALIN 50 MG PO CAPS
50.0000 mg | ORAL_CAPSULE | Freq: Two times a day (BID) | ORAL | Status: DC
  Administered 2020-12-02 – 2020-12-03 (×3): 50 mg via ORAL
  Filled 2020-12-02 (×3): qty 1

## 2020-12-02 MED ORDER — PHENYLEPHRINE HCL (PRESSORS) 10 MG/ML IV SOLN
INTRAVENOUS | Status: AC
  Filled 2020-12-02: qty 1

## 2020-12-02 MED ORDER — ROCURONIUM BROMIDE 10 MG/ML (PF) SYRINGE
PREFILLED_SYRINGE | INTRAVENOUS | Status: AC
  Filled 2020-12-02: qty 10

## 2020-12-02 MED ORDER — 0.9 % SODIUM CHLORIDE (POUR BTL) OPTIME
TOPICAL | Status: DC | PRN
  Administered 2020-12-02 (×2): 500 mL

## 2020-12-02 MED ORDER — MIDAZOLAM HCL 2 MG/2ML IJ SOLN
INTRAMUSCULAR | Status: DC | PRN
Start: 2020-12-02 — End: 2020-12-02
  Administered 2020-12-02: 2 mg via INTRAVENOUS

## 2020-12-02 MED ORDER — FENTANYL CITRATE (PF) 100 MCG/2ML IJ SOLN
INTRAMUSCULAR | Status: DC | PRN
  Administered 2020-12-02: 100 ug via INTRAVENOUS

## 2020-12-02 MED ORDER — LIDOCAINE HCL (CARDIAC) PF 100 MG/5ML IV SOSY
PREFILLED_SYRINGE | INTRAVENOUS | Status: DC | PRN
  Administered 2020-12-02: 100 mg via INTRAVENOUS

## 2020-12-02 MED ORDER — LACTATED RINGERS IV SOLN: INTRAVENOUS | Status: DC

## 2020-12-02 MED ORDER — FENTANYL CITRATE (PF) 100 MCG/2ML IJ SOLN
25.0000 ug | INTRAMUSCULAR | Status: DC | PRN
  Administered 2020-12-02 (×3): 25 ug via INTRAVENOUS

## 2020-12-02 MED ORDER — SUGAMMADEX SODIUM 200 MG/2ML IV SOLN
INTRAVENOUS | Status: DC | PRN
  Administered 2020-12-02: 200 mg via INTRAVENOUS

## 2020-12-02 MED ORDER — OXYCODONE-ACETAMINOPHEN 5-325 MG PO TABS
1.0000 | ORAL_TABLET | ORAL | Status: DC | PRN
  Administered 2020-12-02 – 2020-12-03 (×3): 2 via ORAL
  Filled 2020-12-02 (×3): qty 2

## 2020-12-02 MED ORDER — CHLORHEXIDINE GLUCONATE 0.12 % MT SOLN
15.0000 mL | Freq: Once | OROMUCOSAL | Status: AC
  Administered 2020-12-02: 15 mL via OROMUCOSAL

## 2020-12-02 MED ORDER — BELLADONNA ALKALOIDS-OPIUM 16.2-60 MG RE SUPP
RECTAL | Status: AC
  Administered 2020-12-02: 1 via RECTAL
  Filled 2020-12-02: qty 1

## 2020-12-02 MED ORDER — ZOLPIDEM TARTRATE 5 MG PO TABS
5.0000 mg | ORAL_TABLET | Freq: Every evening | ORAL | Status: DC | PRN
  Administered 2020-12-02: 5 mg via ORAL
  Filled 2020-12-02: qty 1

## 2020-12-02 SURGICAL SUPPLY — 99 items
ADH SKN CLS APL DERMABOND .7 (GAUZE/BANDAGES/DRESSINGS) ×2
AGENT HMST KT MTR STRL THRMB (HEMOSTASIS) ×2
ANCHOR TIS RET SYS 235ML (MISCELLANEOUS) ×3 IMPLANT
APL ESCP 34 STRL LF DISP (HEMOSTASIS) ×2
APL PRP STRL LF DISP 70% ISPRP (MISCELLANEOUS) ×4
APPLICATOR SURGIFLO ENDO (HEMOSTASIS) ×3 IMPLANT
APPLIER CLIP LOGIC TI 5 (MISCELLANEOUS) IMPLANT
APR CLP MED LRG 33X5 (MISCELLANEOUS)
BAG DRN RND TRDRP ANRFLXCHMBR (UROLOGICAL SUPPLIES) ×2
BAG TISS RTRVL C235 10X14 (MISCELLANEOUS) ×2
BAG URINE DRAIN 2000ML AR STRL (UROLOGICAL SUPPLIES) ×3 IMPLANT
BLADE CLIPPER SURG (BLADE) ×3 IMPLANT
BULB RESERV EVAC DRAIN JP 100C (MISCELLANEOUS) IMPLANT
CATH DRAINAGE MALECOT 26FR (CATHETERS) ×2 IMPLANT
CATH FOL 2WAY LX 18X5 (CATHETERS) ×6 IMPLANT
CATH MALECOT (CATHETERS) ×3
CHLORAPREP W/TINT 26 (MISCELLANEOUS) ×6 IMPLANT
CLIP VESOLOCK LG 6/CT PURPLE (CLIP) ×8 IMPLANT
COVER TIP SHEARS 8 DVNC (MISCELLANEOUS) ×2 IMPLANT
COVER TIP SHEARS 8MM DA VINCI (MISCELLANEOUS) ×3
DEFOGGER SCOPE WARMER CLEARIFY (MISCELLANEOUS) ×3 IMPLANT
DERMABOND ADVANCED (GAUZE/BANDAGES/DRESSINGS) ×1
DERMABOND ADVANCED .7 DNX12 (GAUZE/BANDAGES/DRESSINGS) ×2 IMPLANT
DRAIN CHANNEL JP 15F RND 16 (MISCELLANEOUS) IMPLANT
DRAIN CHANNEL JP 19F (MISCELLANEOUS) IMPLANT
DRAPE 3/4 80X56 (DRAPES) ×3 IMPLANT
DRAPE ARM DVNC X/XI (DISPOSABLE) ×8 IMPLANT
DRAPE COLUMN DVNC XI (DISPOSABLE) ×2 IMPLANT
DRAPE DA VINCI XI ARM (DISPOSABLE) ×12
DRAPE DA VINCI XI COLUMN (DISPOSABLE) ×3
DRAPE LEGGINS SURG 28X43 STRL (DRAPES) ×3 IMPLANT
DRAPE SURG 17X11 SM STRL (DRAPES) ×12 IMPLANT
DRAPE UNDER BUTTOCK W/FLU (DRAPES) ×3 IMPLANT
DRSG TELFA 3X8 NADH (GAUZE/BANDAGES/DRESSINGS) ×3 IMPLANT
ELECT REM PT RETURN 9FT ADLT (ELECTROSURGICAL) ×3
ELECTRODE REM PT RTRN 9FT ADLT (ELECTROSURGICAL) ×2 IMPLANT
GAUZE 4X4 16PLY ~~LOC~~+RFID DBL (SPONGE) ×3 IMPLANT
GLOVE SURG ENC MOIS LTX SZ6.5 (GLOVE) ×6 IMPLANT
GLOVE SURG UNDER POLY LF SZ7.5 (GLOVE) ×3 IMPLANT
GOWN STRL REUS W/ TWL LRG LVL3 (GOWN DISPOSABLE) ×4 IMPLANT
GOWN STRL REUS W/ TWL XL LVL3 (GOWN DISPOSABLE) ×2 IMPLANT
GOWN STRL REUS W/TWL LRG LVL3 (GOWN DISPOSABLE) ×6
GOWN STRL REUS W/TWL XL LVL3 (GOWN DISPOSABLE) ×3
GRASPER SUT TROCAR 14GX15 (MISCELLANEOUS) ×3 IMPLANT
HEMOSTAT SURGICEL 2X14 (HEMOSTASIS) ×2 IMPLANT
HOLDER FOLEY CATH W/STRAP (MISCELLANEOUS) ×3 IMPLANT
IRRIGATION STRYKERFLOW (MISCELLANEOUS) ×2 IMPLANT
IRRIGATOR STRYKERFLOW (MISCELLANEOUS) ×3
IV LACTATED RINGERS 1000ML (IV SOLUTION) ×3 IMPLANT
KIT PINK PAD W/HEAD ARE REST (MISCELLANEOUS) ×3
KIT PINK PAD W/HEAD ARM REST (MISCELLANEOUS) ×2 IMPLANT
LABEL OR SOLS (LABEL) ×3 IMPLANT
MANIFOLD NEPTUNE II (INSTRUMENTS) ×3 IMPLANT
MARKER SKIN DUAL TIP RULER LAB (MISCELLANEOUS) ×3 IMPLANT
NDL INSUFFLATION 14GA 120MM (NEEDLE) ×1 IMPLANT
NEEDLE HYPO 22GX1.5 SAFETY (NEEDLE) ×5 IMPLANT
NEEDLE INSUFFLATION 14GA 120MM (NEEDLE) ×3 IMPLANT
NS IRRIG 500ML POUR BTL (IV SOLUTION) ×4 IMPLANT
OBTURATOR OPTICAL STANDARD 8MM (TROCAR) ×3
OBTURATOR OPTICAL STND 8 DVNC (TROCAR) ×2
OBTURATOR OPTICALSTD 8 DVNC (TROCAR) ×2 IMPLANT
PACK LAP CHOLECYSTECTOMY (MISCELLANEOUS) ×3 IMPLANT
PAD DRESSING TELFA 3X8 NADH (GAUZE/BANDAGES/DRESSINGS) ×1 IMPLANT
PENCIL ELECTRO HAND CTR (MISCELLANEOUS) ×3 IMPLANT
RELOAD STAPLE 60 2.6 WHT THN (STAPLE) IMPLANT
RELOAD STAPLER WHITE 60MM (STAPLE) IMPLANT
SEAL CANN UNIV 5-8 DVNC XI (MISCELLANEOUS) ×8 IMPLANT
SEAL XI 5MM-8MM UNIVERSAL (MISCELLANEOUS) ×12
SET TUBE SMOKE EVAC HIGH FLOW (TUBING) ×3 IMPLANT
SOLUTION ELECTROLUBE (MISCELLANEOUS) ×3 IMPLANT
SPONGE T-LAP 4X18 ~~LOC~~+RFID (SPONGE) ×3 IMPLANT
SPONGE VERSALON 4X4 4PLY (MISCELLANEOUS) IMPLANT
STAPLE ECHEON FLEX 60 POW ENDO (STAPLE) IMPLANT
STAPLER RELOAD WHITE 60MM (STAPLE)
STAPLER SKIN PROX 35W (STAPLE) ×3 IMPLANT
STRAP SAFETY 5IN WIDE (MISCELLANEOUS) ×6 IMPLANT
SURGIFLO W/THROMBIN 8M KIT (HEMOSTASIS) ×3 IMPLANT
SURGILUBE 2OZ TUBE FLIPTOP (MISCELLANEOUS) ×3 IMPLANT
SUT DVC VLOC 90 3-0 CV23 UNDY (SUTURE) ×6 IMPLANT
SUT DVC VLOC 90 3-0 CV23 VLT (SUTURE) ×6
SUT ETHILON 3-0 FS-10 30 BLK (SUTURE)
SUT MNCRL 4-0 (SUTURE) ×6
SUT MNCRL 4-0 27XMFL (SUTURE) ×4
SUT SILK 2 0 SH (SUTURE) IMPLANT
SUT VIC AB 0 CT1 36 (SUTURE) ×4 IMPLANT
SUT VIC AB 0 CT2 27 (SUTURE) ×1 IMPLANT
SUT VIC AB 2-0 SH 27 (SUTURE) ×6
SUT VIC AB 2-0 SH 27XBRD (SUTURE) ×4 IMPLANT
SUT VICRYL 0 AB UR-6 (SUTURE) ×5 IMPLANT
SUTURE DVC VLC 90 3-0 CV23 VLT (SUTURE) ×3 IMPLANT
SUTURE EHLN 3-0 FS-10 30 BLK (SUTURE) IMPLANT
SUTURE MNCRL 4-0 27XMF (SUTURE) ×4 IMPLANT
SYR 10ML LL (SYRINGE) ×3 IMPLANT
SYR BULB IRRIG 60ML STRL (SYRINGE) ×3 IMPLANT
SYR TOOMEY 50ML (SYRINGE) ×6 IMPLANT
TAPE CLOTH 3X10 WHT NS LF (GAUZE/BANDAGES/DRESSINGS) ×6 IMPLANT
TROCAR XCEL 12X100 BLDLESS (ENDOMECHANICALS) ×3 IMPLANT
TROCAR XCEL NON-BLD 5MMX100MML (ENDOMECHANICALS) ×3 IMPLANT
WATER STERILE IRR 500ML POUR (IV SOLUTION) ×3 IMPLANT

## 2020-12-02 NOTE — Op Note (Addendum)
12/02/20  PREOPERATIVE DIAGNOSIS: Prostate cancer. Seminal vesicle dilation  POSTOPERATIVE DIAGNOSIS: Prostate cancer. Seminal vesicle dilation  OPERATION PERFORMED: 1. DaVinci laparoscopic radical prostatectomy (bilateral nerve sparing) 2 DaVinci laproscopic bilateral pelvic lymph node dissection.  SURGEON: [name], MD  ASSISTANTS: Nickolas Madrid, MD  ANESTHESIA: General.  EBL: 350 cc  SPECIMEN: Prostate with bilateral seminal vesicals, bilateral pelvic lymph nodes, anterior fat pad.  FINDINGS: Massively dilated abnormal, vesicles bilaterally, partially amputated.  Negative rectal test x3.  INDICATION: Pt.is a 65 year old male with favorable intermediate risk prostate cancer Treatment options were discussed with him at length and he chose DaVinci radical prostatectomy.  There was some concern for possible enlarged lymph node and rectal mass for which he underwent extensive evaluation including colonoscopy which was negative and repeat CT scan which showed no further mass.  Notably, he has had congenitally dilated seminal vesicles bilaterally which are grossly abnormal. Bilateral pelvic lymph node dissection was planned due to his risk stratification.  PROCEDURE IN DETAIL: Patient was given appropriate perioperative antibiotics. He had sequential compression devices applied preoperatively for DVT prophylaxis. He was taken to the operating room where he was induced with general anesthesia. After adequate anesthesia, he was placed in the dorsal lithotomy position. His arms were draped by his side and was appropriately padded and secured to the operating room table. He was placed in the Trendelenburg position.  He was prepped and draped in sterile fashion. An 59 French Foley was placed in the bladder and instilled with 15 cc sterile water. Orogastric tube was placed. The Veress needle was passed just above the umbilicus and the abdomen was insufflated to  15 atmospheres. An 8 mm, blunt-tip trocar was placed just above the umbilicus. The zero-degree camera was passed within this and the following trocars were placed under direct vision; 8 mm robotic trocars were placed 9 cm laterally and inferiorly to the initially placed umbilical trocar. A third one was placed 7 cm lateral to the left-sided trocar. In the corresponding position on the right side, a 12 mm trocar was placed, and then a 5 mm trocar was placed to the right and well above the umbilicus.  The 12 mm assistant port site was preclosed using 0 Vicryl suture using a Carter-Thomason which was tied down at the end of the case to close this port site.  The robot was then docked with the robot trocar. I used the zero-degree camera. I had the hot scissors in the right hand and the left hand with the [place] bipolar and far left hand the Prograsp forceps. Initially I divided the median umbilical ligament bilaterally and the urachus and developed the space of Retzius down to pubic bone. I divided the parietal peritoneum laterally up to the vas deferens on each side. I used the prograsp forceps to provide cranial traction on the urachus. I cleaned off the Endopelvic fascia on each side and then divided it with the scissors laterally to the perirectal fat and medially to the puboprostatic ligaments which were divided. I then ligated the dorsal vein complex 0 Vicryl x2 which was oversewn later in the case with a 2-0 Vicryl on SH.  I then addressed the bladder neck with a 30-degree down lens. I identified the bladder neck by pulling on the Foley catheter. I divided the anterior bladder neck musculature until I then found the anterior bladder neck mucosa which was incised. I identified the Foley catheter within, deflated the balloon, pulled the Foley out through this opening and then using  the Carter-Thomason needle with a #0-Vicryl suture, passed through The suprapubic region and pulled  the suture through the eye of the Foley and then back out. This allowed me to provide upward traction on the prostate. I then divided the lateral bladder neck mucosa and the posterior bladder neck mucosa. I was well away from ureteral orifices. I divided the posterior bladder neck musculature until I identified the vas deferens.  Notably, this dissection was extremely difficult and I entered seminal vesicles multiple times as they were markedly dilated abnormal.  I actually did not complete the seminal vesicle dissection initially as normally would be done due to concern for difficulty identifying a plan.  Rather, I moved to isolating the bilateral pedicles which were ligated using Weck clips and the neurovascular bundle was identified and swept off the prostate laterally towards the apex.  Coming around the lateral aspect of the prostate, I was able to find the posterior plane closer to the apex.  I then went back to the 0-degree lens.  I just the apex of the prostate by dividing the previously oversewn dorsal venous complex.  I identified the urethra and used cold scissors to incise the urethra at the apex along with a posterior urethral plate.  I then oversewed the DVC using a 2-0 Vicryl on an SH.  Hemostasis was reasonable this point time.  I then rolled the prostate caudally and try to develop a plan already started laterally towards the bladder neck.  I ultimately ended up transecting the seminal vesicles to connect the previously started plane from the posterior bladder neck and was ultimately able to free out the specimen. I placed the prostate in an Endo catch bag and then placed the bag in the upper abdomen out of the way. I then irrigated the pelvis. The rectal test was negative x 3. There was reasonable hemostasis.  I then did the pelvic lymph node dissection by incising the fascia overlying the right external iliac vein, dissecting distally. I went just distal to the node of [place] where  we placed clips and then divided the lymphatics. The lateral aspect of the dissection was the pelvic side wall, inferior was the obturator nerve and proximal the hypogastric vessels. I placed clips at the proximal aspect and then divided the lymphatics. This was removed with the spoon grasper and sent to pathology.  I then did the left obturator lymph node dissection in the same fashion as the left side.  With good hemostasis, I then did the posterior reconstruction. I used a 3-0 VLoc suture through the cut edge of Denonvilliers fascia beneath the bladder on the right side and through the posterior striated sphincter underneath the urethra. This brought the bladder neck and urethra and closer proximity to help facilitate anastomosis.   I then did the urethral vesicle anastomosis again with two 3-0 VLoc sutures interlocked. I passed both ends of the suture from the outside-in through the bladder neck at the 6 o'clock position. I passed both through the urethral stump from the inside-out in the corresponding position. I reapproximated the bladder neck to the urethra. I then ran the Left suture on the left side anastomosis to the 9 o'clock position. Then I went back to the right sided suture and ran that up the right side to the 12 o'clock position. I then continued the left suture to the 12 o'clock position. The suture was then suspended anteriorly behind the pubic bone.   I then placed a new 18 [place] Foley  into the bladder and filled it with 10 cc sterile water. I irrigated the bladder with 160 cc. There was no leakage. There was reasonable hemostasis.  Surgicel was used on either side of the pedicles for an additional hemostasis.  Surgi-Flo was also used. The instruments were then removed. The robot was undocked and all the trocars were removed under direct vision. There was good hemostasis. I then enlarged the umbilical trocar site large enough to remove the prostate and I  closed the fascia here with #0-0 Vicryl suture in a running fashion. All the port sites were irrigated. Lidocaine was injected into all the trocar sites. The skin was closed with 4-0 Monocryl in running subcuticular fashion. Dermabond was applied.   At this point patient was awakened and extubated in the operating room and taken to the recovery room in stable condition. There were no complications. All counts correct.  An assistant was required for this surgical procedure. The duties of the assistant included but were not limited to suctioning, passing suture, camera manipulation, retraction. This procedure would not be able to be performed without an Environmental consultant.    [name], MD

## 2020-12-02 NOTE — Anesthesia Preprocedure Evaluation (Signed)
Anesthesia Evaluation  Patient identified by MRN, date of birth, ID band Patient awake    Reviewed: Allergy & Precautions, NPO status , Patient's Chart, lab work & pertinent test results  History of Anesthesia Complications (+) PONV and history of anesthetic complications (After tympanoplasty)  Airway Mallampati: I  TM Distance: >3 FB Neck ROM: Full    Dental  (+) Missing,    Pulmonary neg pulmonary ROS, neg sleep apnea, neg COPD, Patient abstained from smoking.Not current smoker,    Pulmonary exam normal breath sounds clear to auscultation       Cardiovascular Exercise Tolerance: Good METS(-) hypertension(-) CAD and (-) Past MI negative cardio ROS  (-) dysrhythmias  Rhythm:Regular Rate:Normal - Systolic murmurs    Neuro/Psych  Neuromuscular disease negative psych ROS   GI/Hepatic Neg liver ROS, GERD  Medicated and Controlled,(+)     (-) substance abuse  ,   Endo/Other  negative endocrine ROSneg diabetes  Renal/GU negative Renal ROS   prostate cancer    Musculoskeletal  (+) Arthritis , Osteoarthritis,    Abdominal Normal abdominal exam  (+)   Peds  Hematology negative hematology ROS (+)   Anesthesia Other Findings Past Medical History: No date: GERD (gastroesophageal reflux disease) No date: Hemorrhoid No date: Hyperlipidemia No date: Joint pain No date: Reflux  Reproductive/Obstetrics                             Anesthesia Physical  Anesthesia Plan  ASA: 2  Anesthesia Plan: General   Post-op Pain Management:    Induction: Intravenous  PONV Risk Score and Plan: 2 and Ondansetron, Propofol infusion and TIVA  Airway Management Planned: Oral ETT  Additional Equipment: None  Intra-op Plan:   Post-operative Plan:   Informed Consent: I have reviewed the patients History and Physical, chart, labs and discussed the procedure including the risks, benefits and alternatives  for the proposed anesthesia with the patient or authorized representative who has indicated his/her understanding and acceptance.     Dental advisory given  Plan Discussed with: CRNA and Surgeon  Anesthesia Plan Comments: (Discussed risks of anesthesia with patient, including possibility of difficulty with spontaneous ventilation under anesthesia necessitating airway intervention, PONV, and rare risks such as cardiac or respiratory or neurological events. Patient understands.)        Anesthesia Quick Evaluation

## 2020-12-02 NOTE — H&P (Signed)
12/02/20 H&P updated today, no changes  He is undergone colonoscopy as well as CT scan x2.  Initially, there is concern for possible rectal mass, on most recent CT this is not present.  Lymphadenopathy is also resolved which is reassuring.  Plan today to proceed as scheduled.  Regular rate and rhythm Clear to auscultation bilaterally   Dale Gray [birth year] [id number]   Referring provider: Margo Common, PA-C [address] [place],  [place] [address]      Chief Complaint  Patient presents with   Prostate Cancer      HPI: 65 year old male with unfavorable intermediate risk prostate cancer who returns today accompanied by his wife to discuss bone scan and management options.   Please see previous notes for details.   In the interim, he is undergone a bone scan which shows no evidence of metastatic disease.   A CT abdomen pelvis with contrast was ordered but however due to some miscommunication as well as contrast shortage, this has not been performed.     PMH:     Past Medical History:  Diagnosis Date   GERD (gastroesophageal reflux disease)     Hemorrhoid     Hyperlipidemia     Joint pain     Reflux        Surgical History:      Past Surgical History:  Procedure Laterality Date   ANAL FISSURE REPAIR   2014   COLONOSCOPY WITH PROPOFOL N/A 03/19/2017    Procedure: COLONOSCOPY WITH PROPOFOL;  Surgeon: Virgel Manifold, MD;  Location: [facility] ENDOSCOPY;  Service: Endoscopy;  Laterality: N/A;   TYMPANOPLASTY Left 04/30/2014   VASECTOMY   01/06/2002      Home Medications:       Allergies as of 08/07/2020       Reactions    Lovastatin      Sleep disturbance and extremity pains           Medication List         Accurate as of August 07, 2020  5:31 PM. If you have any questions, ask your nurse or doctor.          aspirin 81 MG tablet Take 81 mg by mouth daily.    ezetimibe 10 MG tablet Commonly known as: ZETIA Take 1 tablet (10 mg total) by mouth  daily.    fish oil-omega-3 fatty acids 1000 MG capsule Take 2 g by mouth daily.    omeprazole 40 MG capsule Commonly known as: PRILOSEC Take 1 capsule (40 mg total) by mouth daily.    pregabalin 50 MG capsule Commonly known as: LYRICA Take 1 capsule (50 mg total) by mouth 3 (three) times daily.    zolpidem 12.5 MG CR tablet Commonly known as: AMBIEN CR Take 1 tablet (12.5 mg total) by mouth at bedtime as needed.           Allergies:       Allergies  Allergen Reactions   Lovastatin        Sleep disturbance and extremity pains      Family History:      Family History  Problem Relation Age of Onset   Heart disease Mother     Asthma Mother     Heart disease Father     CAD Father     Healthy Sister     Healthy Brother     Healthy Daughter     Healthy Daughter     Diabetes Maternal Aunt  Diabetes Maternal Uncle     Colon cancer Neg Hx        Social History:  reports that he has never smoked. He has never used smokeless tobacco. He reports that he does not drink alcohol and does not use drugs.     Physical Exam: BP (!) 142/78   Pulse 88   Constitutional:  Alert and oriented, No acute distress. HEENT: [place] AT, moist mucus membranes.  Trachea midline, no masses. Cardiovascular: No clubbing, cyanosis, or edema. Skin: No rashes, bruises or suspicious lesions. Neurologic: Grossly intact, no focal deficits, moving all 4 extremities. Psychiatric: Normal mood and affect.   Pertinent Imaging: CLINICAL DATA:  Prostate cancer screening.   EXAM: NUCLEAR MEDICINE WHOLE BODY BONE SCAN   TECHNIQUE: Whole body anterior and posterior images were obtained approximately 3 hours after intravenous injection of radiopharmaceutical.   RADIOPHARMACEUTICALS:  20.47 mCi Technetium-3m MDP IV   COMPARISON:  None.   FINDINGS: There are no areas of radiotracer localization to suggest metastatic disease to bone.   Mild degenerative uptake noted at the sternoclavicular  joints, knees, wrists and feet/ankles. Renal uptake is symmetric.   IMPRESSION: No evidence of metastatic disease to bone.     Electronically Signed   By: Lajean Manes M.D.   On: 08/04/2020 17:20   Assessment & Plan:     1. Prostate cancer ([place]) Unfavorable intermediate risk prostate cancer, no evidence of metastatic disease on bone scan   Radiology was contacted today and is agreed to do the CT abdomen pelvis with contrast ASAP later today.  Arrangements were made.  I will call him with these results as I anticipate they will likely be negative based on his statistical probability of metastatic disease.   We again reviewed treatment options.  These were outlined on my previous note, please refer to this.  He previously declined radiation oncology consult and is most interested in surgery.  This continues to be his preference today.   Again, reviewed risks of robotic radical prostatectomy as outlined in previous note.  We discussed the preoperative, postoperative, and intraoperative complications.  He would like to go ahead and book this procedure, radical prostatectomy with bilateral pelvic lymph node dissection.   As long as his CT scan is negative for metastatic disease, we will move forward this surgery.  All questions were answered today.       [name], MD   [facility] [address], [place], [place] [address] [phone]

## 2020-12-02 NOTE — Anesthesia Postprocedure Evaluation (Signed)
Anesthesia Post Note  Patient: Derl Abalos Krizek  Procedure(s) Performed: XI ROBOTIC ASSISTED LAPAROSCOPIC RADICAL PROSTATECTOMY PELVIC LYMPH NODE DISSECTION (Bilateral)  Patient location during evaluation: PACU Anesthesia Type: General Level of consciousness: awake and alert Pain management: pain level controlled Vital Signs Assessment: post-procedure vital signs reviewed and stable Respiratory status: spontaneous breathing, nonlabored ventilation and respiratory function stable Cardiovascular status: blood pressure returned to baseline and stable Postop Assessment: no apparent nausea or vomiting Anesthetic complications: no   No notable events documented.   Last Vitals:  Vitals:   12/02/20 1315 12/02/20 1329  BP: 117/71 124/74  Pulse: 63 61  Resp: 19 18  Temp: 36.6 C 36.6 C  SpO2: 100% 100%    Last Pain:  Vitals:   12/02/20 1314  TempSrc:   PainSc: [place]

## 2020-12-02 NOTE — Transfer of Care (Signed)
Immediate Anesthesia Transfer of Care Note  Patient: Dale Gray  Procedure(s) Performed: XI ROBOTIC ASSISTED LAPAROSCOPIC RADICAL PROSTATECTOMY PELVIC LYMPH NODE DISSECTION (Bilateral)  Patient Location: PACU  Anesthesia Type:General  Level of Consciousness: awake  Airway & Oxygen Therapy: Patient Spontanous Breathing  Post-op Assessment: Report given to RN  Post vital signs: stable  Last Vitals:  Vitals Value Taken Time  BP    Temp    Pulse 62 12/02/20 1229  Resp 12 12/02/20 1229  SpO2 100 % 12/02/20 1229  Vitals shown include unvalidated device data.  Last Pain:  Vitals:   12/02/20 1222  TempSrc:   PainSc: Asleep         Complications: No notable events documented.

## 2020-12-03 ENCOUNTER — Encounter: Payer: Self-pay | Admitting: Urology

## 2020-12-03 DIAGNOSIS — Z23 Encounter for immunization: Secondary | ICD-10-CM | POA: Diagnosis not present

## 2020-12-03 DIAGNOSIS — C61 Malignant neoplasm of prostate: Secondary | ICD-10-CM

## 2020-12-03 DIAGNOSIS — Z7982 Long term (current) use of aspirin: Secondary | ICD-10-CM | POA: Diagnosis not present

## 2020-12-03 LAB — BASIC METABOLIC PANEL
Anion gap: 8 (ref 5–15)
BUN: 14 mg/dL (ref 8–23)
CO2: 25 mmol/L (ref 22–32)
Calcium: 8.5 mg/dL — ABNORMAL LOW (ref 8.9–10.3)
Chloride: 104 mmol/L (ref 98–111)
Creatinine, Ser: 1.28 mg/dL — ABNORMAL HIGH (ref 0.61–1.24)
GFR, Estimated: >60 mL/min (ref >60)
Glucose, Bld: 127 mg/dL — ABNORMAL HIGH (ref 70–99)
Potassium: 5 mmol/L (ref 3.5–5.1)
Sodium: 137 mmol/L (ref 135–145)

## 2020-12-03 LAB — CBC
HCT: 33.7 % — ABNORMAL LOW (ref 39.0–52.0)
Hemoglobin: 11.8 g/dL — ABNORMAL LOW (ref 13.0–17.0)
MCH: 32.5 pg (ref 26.0–34.0)
MCHC: 35 g/dL (ref 30.0–36.0)
MCV: 92.8 fL (ref 80.0–100.0)
Platelets: 249 10*3/uL (ref 150–400)
RBC: 3.63 MIL/uL — ABNORMAL LOW (ref 4.22–5.81)
RDW: 13.1 % (ref 11.5–15.5)
WBC: 18 10*3/uL — ABNORMAL HIGH (ref 4.0–10.5)
nRBC: 0 % (ref 0.0–0.2)

## 2020-12-03 MED ORDER — OXYCODONE-ACETAMINOPHEN 5-325 MG PO TABS: 1.0000 | ORAL_TABLET | Freq: Four times a day (QID) | ORAL | 0 refills | Status: DC | PRN

## 2020-12-03 MED ORDER — CHLORHEXIDINE GLUCONATE CLOTH 2 % EX PADS
6.0000 | MEDICATED_PAD | Freq: Every day | CUTANEOUS | Status: DC
  Administered 2020-12-03: 6 via TOPICAL

## 2020-12-03 MED ORDER — OXYBUTYNIN CHLORIDE 5 MG PO TABS: 5.0000 mg | ORAL_TABLET | Freq: Three times a day (TID) | ORAL | 0 refills | Status: DC | PRN

## 2020-12-03 MED ORDER — DOCUSATE SODIUM 100 MG PO CAPS: 100.0000 mg | ORAL_CAPSULE | Freq: Two times a day (BID) | ORAL | 0 refills | Status: DC

## 2020-12-03 NOTE — Plan of Care (Signed)

## 2020-12-03 NOTE — Progress Notes (Signed)
Patient ID: Dale Gray, male   DOB: 1955-11-25, 64 y.o.   MRN: [id number]   12/03/20 1427  Urethral Catheter Dr. [name] Straight-tip 18 Fr.  Placement Date/Time: 12/02/20 (c) [phone]   Perineal care performed prior to insertion?: Yes  Person Inserting LDA: Dr. [name]  Patient Location at Time of Insertion [facility], Department, Room Number): [place] [facility]  Catheter placed prior to periop?: No  Ins...  Output (mL) 1550 mL   Patient will leave with foley intact. Patient reports that a small amount of urine has leaked from around the foley insertion site. MD aware. Pericare done before discharge. Drainage bag changed to leg bag.  Haydee Salter, RN

## 2020-12-03 NOTE — Plan of Care (Signed)
Patient ID: Dale Gray, male   DOB: October 27, 1955, 65 y.o.   MRN: [id number]  Problem: Education: Goal: Knowledge of General Education information will improve Description: Including pain rating scale, medication(s)/side effects and non-pharmacologic comfort measures Outcome: Adequate for Discharge   Problem: Health Behavior/Discharge Planning: Goal: Ability to manage health-related needs will improve Outcome: Adequate for Discharge   Problem: Clinical Measurements: Goal: Ability to maintain clinical measurements within normal limits will improve Outcome: Adequate for Discharge Goal: Will remain free from infection Outcome: Adequate for Discharge Goal: Diagnostic test results will improve Outcome: Adequate for Discharge Goal: Respiratory complications will improve Outcome: Adequate for Discharge Goal: Cardiovascular complication will be avoided Outcome: Adequate for Discharge   Problem: Activity: Goal: Risk for activity intolerance will decrease Outcome: Adequate for Discharge   Problem: Nutrition: Goal: Adequate nutrition will be maintained Outcome: Adequate for Discharge   Problem: Coping: Goal: Level of anxiety will decrease Outcome: Adequate for Discharge   Problem: Elimination: Goal: Will not experience complications related to bowel motility Outcome: Adequate for Discharge Goal: Will not experience complications related to urinary retention Outcome: Adequate for Discharge   Problem: Pain Managment: Goal: General experience of comfort will improve Outcome: Adequate for Discharge   Problem: Safety: Goal: Ability to remain free from injury will improve Outcome: Adequate for Discharge   Problem: Skin Integrity: Goal: Risk for impaired skin integrity will decrease Outcome: Adequate for Discharge   Problem: Education: Goal: Required Educational Video(s) Outcome: Adequate for Discharge   Problem: Clinical Measurements: Goal: Postoperative complications will  be avoided or minimized Outcome: Adequate for Discharge   Problem: Skin Integrity: Goal: Demonstration of wound healing without infection will improve Outcome: Adequate for Discharge    Haydee Salter, RN

## 2020-12-03 NOTE — Progress Notes (Signed)
Mobility Specialist - Progress Note   12/03/20 1400  Mobility  Activity Ambulated in hall  Level of Assistance Standby assist, set-up cues, supervision of patient - no hands on  Assistive Device Front wheel walker  Distance Ambulated (ft) 320 ft  Mobility Ambulated with assistance in hallway  Mobility Response Tolerated well  Mobility performed by Mobility specialist  $Mobility charge 1 Mobility    Pt ambulated in hallway with RW. Supervision and extra time to stand. Pain 5/10 but does decrease to 2/10 once upright. Mild LOB x1 but able to self-correct. Does voice mild lightheadedness, but resolved. Pain does increase toward end of activity 4/10. Pt returned supine with minA. Family at bedside.    Kathee Delton Mobility Specialist 12/03/20, 2:18 PM

## 2020-12-03 NOTE — Discharge Summary (Signed)
Date of admission: 12/02/2020  Date of discharge: 12/03/2020  Admission diagnosis: Prostate cancer, seminal vesicle dilation  Discharge diagnosis: Same as above  Secondary diagnoses:  Patient Active Problem List   Diagnosis Date Noted   Prostate cancer ([place]) 12/02/2020   Abnormal findings on diagnostic imaging of digestive system    Gastric polyp    Neck pain 12/29/2017   Cervical radiculitis 09/23/2017   DDD (degenerative disc disease), cervical 09/23/2017   Special screening for malignant neoplasms, colon    Personal history of colonic polyps    Benign neoplasm of ascending colon    Peripheral neuropathy 01/24/2015   Anal fissure 01/22/2015   Disorder of coccyx 01/22/2015   History of digestive disease 01/22/2015   HLD (hyperlipidemia) 01/22/2015   Anemia, iron deficiency 01/22/2015   Yeast dermatitis 06/22/2012   Thrombosed hemorrhoids    Fam hx-ischem heart disease 07/20/2007   Acid reflux 05/26/2006   Cannot sleep 10/12/2005   Hypercholesterolemia without hypertriglyceridemia 10/12/2005    History and Physical: For full details, please see admission history and physical. Briefly, Dale Gray is a 65 y.o. year old patient admitted on 12/02/2020 for scheduled robotic assisted laparoscopic radical prostatectomy, bilateral nerve sparing, with bilateral pelvic lymph node dissection with Dr. [name].   This morning, he reports some generalized abdominal soreness but overall feels well.  No acute concerns.  Diet advanced in the morning.  He denies nausea/vomiting and is been tolerating clears well.  Physical Exam: Constitutional:  Alert and oriented, no acute distress, nontoxic appearing HEENT: [address], AT Cardiovascular: No clubbing, cyanosis, or edema Respiratory: Normal respiratory effort, no increased work of breathing GI: Abdomen is soft and nondistended without rebound, rigidity, or guarding.  Abdominal incisions noted over the anterior abdomen, all clean, dry, and intact  with overlying surgical adhesive. GU: Foley catheter in place draining clear, yellow urine. Skin: No rashes, bruises or suspicious lesions Neurologic: Grossly intact, no focal deficits, moving all 4 extremities Psychiatric: Normal mood and affect   Hospital Course: Patient tolerated the procedure well.  He was then transferred to the floor after an uneventful PACU stay.  His hospital course was uncomplicated.  On POD#1 he had met discharge criteria: was eating a regular diet, was up and ambulating independently, pain was well controlled, catheter was draining well, and was ready for discharge.  Laboratory values:  Recent Labs    12/03/20 0420  WBC 18.0*  HGB 11.8*  HCT 33.7*   Recent Labs    12/03/20 0420  NA 137  K 5.0  CL 104  CO2 25  GLUCOSE 127*  BUN 14  CREATININE 1.28*  CALCIUM 8.5*   Results for orders placed or performed during the hospital encounter of 11/28/20  SARS CORONAVIRUS 2 (TAT 6-24 HRS) Nasopharyngeal Nasopharyngeal Swab     Status: None   Collection Time: 11/28/20  8:20 AM   Specimen: Nasopharyngeal Swab  Result Value Ref Range Status   SARS Coronavirus 2 NEGATIVE NEGATIVE Final    Comment: (NOTE) SARS-CoV-2 target nucleic acids are NOT DETECTED.  The SARS-CoV-2 RNA is generally detectable in upper and lower respiratory specimens during the acute phase of infection. Negative results do not preclude SARS-CoV-2 infection, do not rule out co-infections with other pathogens, and should not be used as the sole basis for treatment or other patient management decisions. Negative results must be combined with clinical observations, patient history, and epidemiological information. The expected result is Negative.  Fact Sheet for Patients: SugarRoll.be  Fact Sheet for  Healthcare Providers: https://www.woods-mathews.com/  This test is not yet approved or cleared by the [place] and  has been authorized for  detection and/or diagnosis of SARS-CoV-2 by FDA under an Emergency Use Authorization (EUA). This EUA will remain  in effect (meaning this test can be used) for the duration of the COVID-19 declaration under Se ction 564(b)(1) of the Act, 21 U.S.C. section 360bbb-3(b)(1), unless the authorization is terminated or revoked sooner.  Performed at [facility], [place] [address] [place], [place] [address]    Disposition: Home  Discharge instruction: The patient was instructed to be ambulatory but told to refrain from heavy lifting, strenuous activity, or driving.  Catheter care instructions provided to the patient.  Discharge medications:  Allergies as of 12/03/2020       Reactions   Lovastatin    Sleep disturbance and extremity pains        Medication List     TAKE these medications    aspirin 81 MG tablet Take 81 mg by mouth daily.   cephALEXin 500 MG capsule Commonly known as: Keflex Take 1 capsule (500 mg total) by mouth 3 (three) times daily.   docusate sodium 100 MG capsule Commonly known as: COLACE Take 1 capsule (100 mg total) by mouth 2 (two) times daily.   ezetimibe 10 MG tablet Commonly known as: ZETIA Take 1 tablet (10 mg total) by mouth daily. What changed: when to take this   fish oil-omega-3 fatty acids 1000 MG capsule Take 1 g by mouth daily.   multivitamin with minerals tablet Take 1 tablet by mouth daily.   omeprazole 40 MG capsule Commonly known as: PRILOSEC Take 1 capsule (40 mg total) by mouth daily. What changed: when to take this   oxybutynin 5 MG tablet Commonly known as: DITROPAN Take 1 tablet (5 mg total) by mouth every 8 (eight) hours as needed for bladder spasms.   oxyCODONE-acetaminophen 5-325 MG tablet Commonly known as: PERCOCET/ROXICET Take 1-2 tablets by mouth every 6 (six) hours as needed for moderate pain.   polyethylene glycol-electrolytes 420 g solution Commonly known as: GaviLyte-N with Flavor Pack Drink one 8 oz  glass every 20 mins until entire container is finished starting at 5:00pm on 08/19/20   pregabalin 50 MG capsule Commonly known as: LYRICA Take 1 capsule (50 mg total) by mouth 3 (three) times daily. What changed: when to take this   zolpidem 12.5 MG CR tablet Commonly known as: AMBIEN CR Take 1 tablet (12.5 mg total) by mouth at bedtime as needed.        Followup:   Follow-up Information     Debroah Loop, [place]. Go on 12/09/2020.   Specialty: Urology Why: For catheter removal Contact information: [place] [address] [phone]

## 2020-12-03 NOTE — Progress Notes (Signed)
Patient ID: Dale Gray, male   DOB: 12/17/1955, 65 y.o.   MRN: [id number]  An After Visit Summary was printed and given to the patient.   Patient education given on discharge instructions and medication changes and the patient expresses understanding and acceptance of instructions.   Haydee Salter 12/03/2020 4:12 PM

## 2020-12-09 ENCOUNTER — Ambulatory Visit: Payer: BC Managed Care – PPO | Admitting: Physician Assistant

## 2020-12-09 ENCOUNTER — Other Ambulatory Visit: Payer: Self-pay

## 2020-12-09 DIAGNOSIS — C61 Malignant neoplasm of prostate: Secondary | ICD-10-CM

## 2020-12-09 NOTE — Patient Instructions (Signed)
Laparoscopic Radical Prostatectomy, Care After The following information offers guidance on how to care for yourself after your procedure. Your health care provider may also give you more specific instructions. If you have problems or questions, contact your health care provider. What can I expect after the procedure? After the procedure, it is common to have: Mild pain in your lower abdomen. Blood and small clots in your urine for up to 3 weeks. Bladder spasms, or a need to urinate more often than usual. These may last for up to 2 weeks. Swelling in your penis and in the sac that contains your testicles (scrotum). After the thin, flexible urinary tube (Foley catheter)is removed, it is common to have: Burning when you pass urine. This may last for up to 2 weeks after your catheter is removed. Trouble controlling when you urinate (incontinence). You may leak urine sometimes. Your ability to control your urine should improve as you heal. Follow these instructions at home: Medicines Take over-the-counter and prescription medicines only as told by your health care provider. This includes any stool softeners. If you were given an antibiotic, take it as told by your health care provider. Do not stop taking the antibiotic early, even if you start to feel better. Ask your health care provider if the medicine prescribed to you: Requires you to avoid driving or using machinery. Can cause constipation. You may need to take these actions to prevent or treat constipation: Take over-the-counter or prescription medicines. Eat foods that are high in fiber, such as beans, whole grains, and fresh fruits and vegetables. Limit foods that are high in fat and processed sugars, such as fried or sweet foods. Eating and drinking  Follow instructions from your health care provider about eating or drinking restrictions. Drink enough fluid to keep your urine pale yellow. Incision care and drain care  Follow your  health care provider's instructions about how to take care of your incisions and drains, if present. Make sure you: Wash your hands with soap and water for at least 20 seconds before and after you change your bandages (dressings) or handle drains and drainage tubes. If soap and water are not available, use hand sanitizer. Change your dressings as told by your health care provider. Leave stitches (sutures), skin glue, or adhesive strips in place. These skin closures may need to stay in place for 2 weeks or longer. If adhesive strip edges start to loosen and curl up, you may trim the loose edges. Do not remove adhesive strips unless your health care provider tells you to do that. If you have a drain in any incision, follow instructions about how to care for your drain. Do not remove the drain tube or any dressing around it unless your health care provider approves. Check your incision site and the skin around your drain every day for signs of infection. Check for: More redness, swelling, or pain. New fluid or blood. Some dried fluid or blood may be present. Warmth. Pus or a bad smell. Catheter care Always wash your hands with soap and water for at least 20 seconds before and after touching your penis, catheter, and drainage bag. If soap and water are not available, use hand sanitizer. Empty your collection bag every 3 hours during the day, or as often as told by your health care provider. Make sure to keep the catheter secured to keep from tugging on it or pulling it out. Make sure there are no kinks in the tubing that could block the flow  of urine. Keep the urine collection bag below the level of your bladder. Clean the tip of your penis with soap and water twice a day, or as often as told by your health care provider. If you were given ointment to apply to the tip of your penis, follow instructions about how to do this. You will need to visit your health care provider to have your catheter removed. It  may stay in for up to 3 weeks after your procedure. Managing pain and swelling If directed, put ice on the affected area. To do this: Put ice in a plastic bag. Place a towel between your skin and the bag. Leave the ice on for 20 minutes, 2-3 times a day. Remove the ice if your skin turns bright red. If you cannot feel pain, heat, or cold, you have a greater risk of damage to the area. Activity Do not lift anything that is heavier than 10 lb (4.5 kg), or the limit that you are told, until your health care provider says that it is safe. Avoid intense physical activity for as long as told by your health care provider. Avoid sitting for a long time without moving. Get up to take short walks every 1-2 hours. This improves your blood flow and breathing. Ask for help if you feel weak or unsteady. Return to your normal activities as told by your health care provider. Ask your health care provider what activities are safe for you. You may be able to exercise more as you feel better. Ask your health care provider when it is safe for you to drive. Follow instructions from your health care provider about when it is safe for you to engage in sexual activity. General instructions Do not take baths, swim, or use a hot tub until your health care provider approves. Ask your health care provider if you may take showers. You may only be allowed to take sponge baths. Do not strain to have a bowel movement. Do not use any products that contain nicotine or tobacco. These products include cigarettes, chewing tobacco, and vaping devices, such as e-cigarettes. If you need help quitting, ask your health care provider. If your pelvic lymph nodes were removed for testing, it may take a few weeks to get the results. Ask your health care provider, or the department that is doing the test, when your results will be ready and how you will get them. Keep all follow-up visits. Contact a health care provider if: You have a  fever. An incision opens up. You have any of the following around your incision site or drain area: More redness, swelling, or pain. New fluid or blood. Warmth. Pus or a bad smell. You have any of these signs of a urinary tract infection: Pain or burning when you urinate. Needing to urinate often without being able to pass much urine. Pain in your back or lower abdomen. You have problems with your catheter or drain, if present. You have bladder spasms more than 3 or 4 times a day. You pass urine around your catheter instead of through the catheter. You become constipated. You have pain that gets worse. Get help right away if: Your catheter falls out. You cannot urinate or urine stops coming out through the catheter. You have more bright red blood in your urine 3 weeks or more after your procedure. You have large blood clots in your urine. You develop: Chest pain or shortness of breath. Swelling, warmth, or pain in your legs. Severe pain  or swelling in your abdomen. These symptoms may represent a serious problem that is an emergency. Do not wait to see if the symptoms will go away. Get medical help right away. Call your local emergency services (911 in the [place] Do not drive yourself to the hospital. Summary After your procedure, it is common to have mild pain, blood and small clots in your urine for up to 3 weeks, and a need to urinate more often. Follow instructions from your health care provider about how to take care of your incisions, catheter, and drain, if present. If your pelvic lymph nodes were removed for testing, it may take a few weeks to get the results. Ask your health care provider, or the department that is doing the test, when your results will be ready and how you will get them. Keep all follow-up visits. This information is not intended to replace advice given to you by your health care provider. Make sure you discuss any questions you have with your health care  provider. Document Revised: 05/22/2020 Document Reviewed: 05/22/2020 Elsevier Patient Education  [employer].

## 2020-12-09 NOTE — Progress Notes (Signed)
Catheter Removal  Patient is present today for a catheter removal. 32ml of water was drained from the balloon. A 18FR foley cath was removed from the bladder no complications were noted. Patient tolerated well.   Performed by: Gordy Clement, CMA   Follow up/ Additional notes: RTC in 3wks for PSA, 5wks for routine post op.

## 2020-12-10 ENCOUNTER — Other Ambulatory Visit: Payer: Self-pay | Admitting: Physician Assistant

## 2020-12-10 ENCOUNTER — Other Ambulatory Visit: Payer: Self-pay | Admitting: Anatomic Pathology & Clinical Pathology

## 2020-12-11 ENCOUNTER — Other Ambulatory Visit: Payer: Self-pay | Admitting: Family Medicine

## 2020-12-11 DIAGNOSIS — F5101 Primary insomnia: Secondary | ICD-10-CM

## 2020-12-11 NOTE — Telephone Encounter (Signed)
[facility] faxed refill request for the following medications:  zolpidem (AMBIEN CR) 12.5 MG CR tablet   Please advise.

## 2020-12-12 ENCOUNTER — Other Ambulatory Visit: Payer: Self-pay

## 2020-12-12 ENCOUNTER — Other Ambulatory Visit: Payer: BC Managed Care – PPO

## 2020-12-12 MED ORDER — ZOLPIDEM TARTRATE ER 12.5 MG PO TBCR: 12.5000 mg | EXTENDED_RELEASE_TABLET | Freq: Every evening | ORAL | 3 refills | Status: DC | PRN

## 2020-12-12 NOTE — Progress Notes (Signed)
Tumor Board Documentation  Dennis Hegeman Bunner was presented by Dr [name] at our Tumor Board on 12/12/2020, which included representatives from radiation oncology, internal medicine, navigation, pathology, medical oncology, surgical, radiology, pharmacy, pulmonology, genetics, nutrition, research, palliative care.  Jansen currently presents as an external consult, for [place], for new positive pathology with history of the following treatments: surgical intervention(s), active survellience.  Additionally, we reviewed previous medical and familial history, history of present illness, and recent lab results along with all available histopathologic and imaging studies. The tumor board considered available treatment options and made the following recommendations: Active surveillance (Follow PSA)    The following procedures/referrals were also placed: No orders of the defined types were placed in this encounter.   Clinical Trial Status: not discussed   Staging used: AJCC Stage Group  National site-specific guidelines NCCN were discussed with respect to the case.  Tumor board is a meeting of clinicians from various specialty areas who evaluate and discuss patients for whom a multidisciplinary approach is being considered. Final determinations in the plan of care are those of the provider(s). The responsibility for follow up of recommendations given during tumor board is that of the provider.   Today's extended care, comprehensive team conference, [place] was not present for the discussion and was not examined.   Multidisciplinary Tumor Board is a multidisciplinary case peer review process.  Decisions discussed in the Multidisciplinary Tumor Board reflect the opinions of the specialists present at the conference without having examined the patient.  Ultimately, treatment and diagnostic decisions rest with the primary provider(s) and the patient.

## 2020-12-17 ENCOUNTER — Ambulatory Visit: Payer: Self-pay | Admitting: Urology

## 2020-12-27 ENCOUNTER — Encounter: Payer: Self-pay | Admitting: Physical Therapy

## 2020-12-27 ENCOUNTER — Other Ambulatory Visit: Payer: Self-pay

## 2020-12-27 ENCOUNTER — Ambulatory Visit: Payer: BC Managed Care – PPO | Attending: Urology | Admitting: Physical Therapy

## 2020-12-27 DIAGNOSIS — R278 Other lack of coordination: Secondary | ICD-10-CM | POA: Diagnosis not present

## 2020-12-27 DIAGNOSIS — M6281 Muscle weakness (generalized): Secondary | ICD-10-CM | POA: Insufficient documentation

## 2020-12-27 DIAGNOSIS — R293 Abnormal posture: Secondary | ICD-10-CM | POA: Diagnosis not present

## 2020-12-27 NOTE — Therapy (Signed)
[place] [facility] [address] [place], [place], [address] Phone: [phone]   Fax:  [phone]  Physical Therapy Treatment  Patient Details  Name: Dale Gray MRN: [id number] Date of Birth: Jul 24, 1955 Referring Provider (PT): [name]   Encounter Date: 12/27/2020   PT End of Session - 12/27/20 1012     Visit Number 4    Number of Visits 18    Date for PT Re-Evaluation 03/21/21    PT Start Time 7253    PT Stop Time 1100    PT Time Calculation (min) 45 min    Activity Tolerance Patient tolerated treatment well    Behavior During Therapy [facility] for tasks assessed/performed             Past Medical History:  Diagnosis Date   Cancer ([place])    prostate   Complication of anesthesia    hard time waking up   GERD (gastroesophageal reflux disease)    Hemorrhoid    Hyperlipidemia    Joint pain    PONV (postoperative nausea and vomiting)    after 1st ear surgery   Reflux     Past Surgical History:  Procedure Laterality Date   ANAL FISSURE REPAIR  2014   CARPAL TUNNEL RELEASE Right    and elbow surgery   COLONOSCOPY WITH PROPOFOL N/A 03/19/2017   Procedure: COLONOSCOPY WITH PROPOFOL;  Surgeon: Virgel Manifold, MD;  Location: [facility] ENDOSCOPY;  Service: Endoscopy;  Laterality: N/A;   COLONOSCOPY WITH PROPOFOL N/A 08/20/2020   Procedure: COLONOSCOPY WITH PROPOFOL;  Surgeon: Lucilla Lame, MD;  Location: [facility] ENDOSCOPY;  Service: Endoscopy;  Laterality: N/A;   ESOPHAGOGASTRODUODENOSCOPY (EGD) WITH PROPOFOL N/A 08/20/2020   Procedure: ESOPHAGOGASTRODUODENOSCOPY (EGD) WITH PROPOFOL;  Surgeon: Lucilla Lame, MD;  Location: [facility] ENDOSCOPY;  Service: Endoscopy;  Laterality: N/A;   PELVIC LYMPH NODE DISSECTION Bilateral 12/02/2020   Procedure: PELVIC LYMPH NODE DISSECTION;  Surgeon: [name], MD;  Location: [facility] ORS;  Service: Urology;  Laterality: Bilateral;   ROBOT ASSISTED LAPAROSCOPIC RADICAL PROSTATECTOMY N/A  12/02/2020   Procedure: XI ROBOTIC ASSISTED LAPAROSCOPIC RADICAL PROSTATECTOMY;  Surgeon: [name], MD;  Location: [facility] ORS;  Service: Urology;  Laterality: N/A;   TYMPANOPLASTY Left 04/30/2014   TYMPANOPLASTY  10/2014   VASECTOMY  01/06/2002    There were no vitals filed for this visit.   Subjective Assessment - 12/27/20 1145     Subjective Patient presents to clinic roughly 4 weeks post-op RP with uncomplicated course of care. Patient reports that he has continued to work on his home exercise program religiously. He notes UI has been most problematic post-surgical symptom, but also notes some mild tenderness at the R abdominal scar site. Patient has had return of urinary urge sensation but only when laying down or when in sitting. Patient reports almost constant leakage when in standing with no urge. Patient also notes that "any time I move, I leak." Patient notes that on a day where he is standing and active he will use at least 3 briefs in combination with roughly 7 pads/day. Patient states that nocturia is 3-4x/night but he is only having "light" UI or no UI during his sleeping hours. Patient has been walking regularly and has been working on PFM strength in all positions and has been using PFM contraction post-void to ensure urethra is empty. Patient does note consistent post-void dribble, but he allows for this by waiting and contracting muscles.    Pertinent History s/p RP on  9/26; catheter removal on 10/3. Follow-up with urology [name]) on 01/08/21.    Currently in Pain? No/denies                [facility] PT Assessment - 12/27/20 1203       Assessment   Medical Diagnosis Prostate Cancer    Referring Provider (PT) [name]    Onset Date/Surgical Date 12/02/20    Hand Dominance Right    Next MD Visit 01/08/2021    Prior Therapy Pre-op      Balance Screen   Has the patient fallen in the past 6 months No            TREATMENT Neuromuscular  Re-education: Sidelying diaphragmatic breathing with VCs and TCs for downregulation of the nervous system and improved management of IAP Sideying, PFM contractions with exhalation. VCs and TCs to decrease compensatory patterns and encourage activation of the PFM. Supine hooklying, TrA activation with exhalation (x10). VCs and TCs to decrease compensatory patterns and minimize aggravation of the lumbar paraspinals. [facility] Supine heel slides with TrA, coordinated breath Supine marches with TrA, coordinated breath Supine toe taps with TrA, coordinated breath Supine bent knee fall out with TrA, coordinated breath Patient education on "milking"/ bulbar urethral massage for improved emptying and decreased post-void dribble.  Patient education on 'the knack" breath strategy with movement against gravity to decrease risk of UI and improve coordination of PFM during functional activities.    Patient educated throughout session on appropriate technique and form using multi-modal cueing, HEP, and activity modification. Patient articulated understanding and returned demonstration.  Patient Response to interventions: Comfortable to follow up 01/09/2021.  ASSESSMENT Patient presents to clinic with excellent   motivation to participate in therapy. Patient demonstrates deficits in   posture, IAP management, PFM strength, PFM coordination. Patient able to coordinate and perform [facility] exercises through phase 2 during today's session. Patient educated on PFM strengthening program and demonstrating appropriate coordination albeit decreased strength and endurance (3/5 MMT with initial rep and 2/5 with subsequent repetitions). Patient responded positively to educational   interventions and is motivated to work on comprehensive deep core and PFM HEP for two weeks before returning to clinic. Patient will benefit from continued skilled therapeutic intervention to  address remaining deficits in posture, IAP management, PFM strength, PFM coordination in order to increase function and improve overall QOL.     PT Long Term Goals - 12/27/20 1155       PT LONG TERM GOAL #1   Title Patient will demonstrate independence with HEP in order to maximize therapeutic gains and improve carryover from physical therapy sessions to ADLs in the home and community.    Baseline IE: provided; 10/21: IND    Time 6    Period Weeks    Status Achieved      PT LONG TERM GOAL #2   Title Patient will demonstrate independent and coordinated diaphragmatic breathing in supine with a 1:2 breathing pattern for improved down-regulation of the nervous system and improved management of intra-abdominal pressures in order to increase function at home and in the community.    Baseline IE: not demonstrated; 10/21: IND    Time 6    Period Weeks    Status Achieved      PT LONG TERM GOAL #3   Title Patient will demonstrate improved strength and coordination of TrA as evidenced by ability to perform [facility] exercises through phase 5 as well as proper lifting mechanics  for object weighing 25 lbs to minimize SUI s/p surgical intervention.    Baseline IE: not demonstrated; 10/21: able to perform through phase 2    Time 12    Period Weeks    Status On-going    Target Date 03/21/21      PT LONG TERM GOAL #4   Title Patient will demonstrate circumferential and sequential contraction of >4/5 MMT, > 6 sec hold x10 and 5 consecutive quick flicks with </= 10 min rest between testing bouts, and relaxation of the PFM coordinated with breath for improved management of intra-abdominal pressure and normal bowel and bladder function without the presence of pain nor incontinence in order to improve participation at home and in the community.    Baseline IE: not demonstrated; 10/21: 3/5 MMT, x3 consecutive contractions before quality changed    Time 12    Period Weeks    Status  On-going    Target Date 03/21/21      PT LONG TERM GOAL #5   Title Patient will report protective undergarment usage to be less than 3 pads and 2 briefs per day with activity and will note pad weight to be "light" throughout the day in order to demonstrate improved bladder control for participation at home and in the community.    Baseline 10/21: 3 briefs w/ 7 pads/day    Time 12    Period Weeks    Status New    Target Date 03/21/21      Additional Long Term Goals   Additional Long Term Goals Yes      PT LONG TERM GOAL #6   Title Patient will report nocturia to be 1-2x/night (commensurate with peer group) and without any UI in order to demonstrate improved bladder control and allow for improved sleep hygiene.    Baseline 10/21: 3-4x/night, "light" UI    Time 12    Period Weeks    Status New    Target Date 03/21/21                   Plan - 12/27/20 1013     Clinical Impression Statement Patient presents to clinic with excellent   motivation to participate in therapy. Patient demonstrates deficits in   posture, IAP management, PFM strength, PFM coordination. Patient able to coordinate and perform [facility] exercises through phase 2 during today's session. Patient educated on PFM strengthening program and demonstrating appropriate coordination albeit decreased strength and endurance (3/5 MMT with initial rep and 2/5 with subsequent repetitions). Patient responded positively to educational   interventions and is motivated to work on comprehensive deep core and PFM HEP for two weeks before returning to clinic. Patient will benefit from continued skilled therapeutic intervention to address remaining deficits in posture, IAP management, PFM strength, PFM coordination in order to increase function and improve overall QOL.    Personal Factors and Comorbidities Comorbidity 3+;Age;Behavior Pattern    Comorbidities GERD, prostate cancer, anal fissure, insomnia,  anemia, neuropathy    Examination-Activity Limitations Toileting    Examination-Participation Restrictions Occupation    Stability/Clinical Decision Making Evolving/Moderate complexity    Rehab Potential Good    PT Frequency 1x / week    PT Duration 12 weeks    PT Treatment/Interventions Cryotherapy;Electrical Stimulation;Moist Heat;Therapeutic exercise;Neuromuscular re-education;Patient/family education;Manual techniques;Taping;Joint Manipulations;Spinal Manipulations    PT Next Visit Plan IPSS outcome, deep core, PFM progression    PT Home Exercise Plan Access Code: [email or web address]    Consulted and Agree with Plan  of Care Patient             Patient will benefit from skilled therapeutic intervention in order to improve the following deficits and impairments:  Postural dysfunction, Improper body mechanics, Decreased coordination, Decreased strength, Decreased endurance  Visit Diagnosis: Abnormal posture  Other lack of coordination     Problem List Patient Active Problem List   Diagnosis Date Noted   Prostate cancer ([place]) 12/02/2020   Abnormal findings on diagnostic imaging of digestive system    Gastric polyp    Neck pain 12/29/2017   Cervical radiculitis 09/23/2017   DDD (degenerative disc disease), cervical 09/23/2017   Special screening for malignant neoplasms, colon    Personal history of colonic polyps    Benign neoplasm of ascending colon    Peripheral neuropathy 01/24/2015   Anal fissure 01/22/2015   Disorder of coccyx 01/22/2015   History of digestive disease 01/22/2015   HLD (hyperlipidemia) 01/22/2015   Anemia, iron deficiency 01/22/2015   Yeast dermatitis 06/22/2012   Thrombosed hemorrhoids    Fam hx-ischem heart disease 07/20/2007   Acid reflux 05/26/2006   Cannot sleep 10/12/2005   Hypercholesterolemia without hypertriglyceridemia 10/12/2005   Myles Gip PT, DPT [phone]  12/27/2020, 12:20 PM  [facility]  [facility] [address]. [place], [place], [address] Phone: [phone]   Fax:  [phone]  Name: Dale Gray MRN: [id number] Date of Birth: 10/22/1955

## 2020-12-30 ENCOUNTER — Other Ambulatory Visit: Payer: Self-pay

## 2020-12-30 ENCOUNTER — Other Ambulatory Visit: Payer: BC Managed Care – PPO

## 2020-12-30 DIAGNOSIS — C61 Malignant neoplasm of prostate: Secondary | ICD-10-CM | POA: Diagnosis not present

## 2020-12-31 LAB — PSA: Prostate Specific Ag, Serum: 2.6 ng/mL (ref 0.0–4.0)

## 2021-01-07 NOTE — Progress Notes (Signed)
01/08/21 9:08 AM   Dale Gray 12/06/1955 [id number]  Referring provider:  Margo Common, PA-C No address on file Chief Complaint  Patient presents with   Post-op Follow-up     HPI: Dale Gray is a 65 y.o.male with a personal history of unfavorable intermediate risk prostate cancer, who presents today for 5 week post-op follow.  He is now s/p DaVinci laparoscopic radical prostatectomy and bilateral pelvic lymph node dissection on 12/02/2020. Periprostatic fat pad, pelvic lymph node, right,  pelvic lymph node, left, and prostate were sent for pathology. Surgical pathology revealed negative for malignancy aside for prostate that revealed invasive prostatic ductal adenocarcinoma. Negative LN, BN, ECE however + margin at left base which was intraoperative technically difficult in the setting of massive vas dilation,proximity to rectum as well as absence of posterior plane.  Recent PSA on 12/30/2020 was 2.6.   He is accompanied by a family member today. He reports today that he is still experiencing leakage.   No wound issues.  Anxious to return to full activity.     PSA trend:  Component Prostate Specific Ag, Serum  Latest Ref Rng & Units 0.0 - 4.0 ng/mL  02/17/2016 3.4  03/10/2018 6.5 (H)  09/12/2018 7.3 (H)  10/25/2018 7.3 (H)  04/04/2019 9.1 (H)  07/31/2019 9.2 (H)  12/06/2019 5.0 (H)  05/10/2020 6.1 (H)  06/11/2020 9.3 (H)  12/30/2020 2.6    PMH: Past Medical History:  Diagnosis Date   Cancer ([place])    prostate   Complication of anesthesia    hard time waking up   GERD (gastroesophageal reflux disease)    Hemorrhoid    Hyperlipidemia    Joint pain    PONV (postoperative nausea and vomiting)    after 1st ear surgery   Reflux     Surgical History: Past Surgical History:  Procedure Laterality Date   ANAL FISSURE REPAIR  2014   CARPAL TUNNEL RELEASE Right    and elbow surgery   COLONOSCOPY WITH PROPOFOL N/A 03/19/2017   Procedure: COLONOSCOPY WITH  PROPOFOL;  Surgeon: Virgel Manifold, MD;  Location: [facility] ENDOSCOPY;  Service: Endoscopy;  Laterality: N/A;   COLONOSCOPY WITH PROPOFOL N/A 08/20/2020   Procedure: COLONOSCOPY WITH PROPOFOL;  Surgeon: Lucilla Lame, MD;  Location: [facility] ENDOSCOPY;  Service: Endoscopy;  Laterality: N/A;   ESOPHAGOGASTRODUODENOSCOPY (EGD) WITH PROPOFOL N/A 08/20/2020   Procedure: ESOPHAGOGASTRODUODENOSCOPY (EGD) WITH PROPOFOL;  Surgeon: Lucilla Lame, MD;  Location: [facility] ENDOSCOPY;  Service: Endoscopy;  Laterality: N/A;   PELVIC LYMPH NODE DISSECTION Bilateral 12/02/2020   Procedure: PELVIC LYMPH NODE DISSECTION;  Surgeon: [name], MD;  Location: [facility] ORS;  Service: Urology;  Laterality: Bilateral;   ROBOT ASSISTED LAPAROSCOPIC RADICAL PROSTATECTOMY N/A 12/02/2020   Procedure: XI ROBOTIC ASSISTED LAPAROSCOPIC RADICAL PROSTATECTOMY;  Surgeon: [name], MD;  Location: [facility] ORS;  Service: Urology;  Laterality: N/A;   TYMPANOPLASTY Left 04/30/2014   TYMPANOPLASTY  10/2014   VASECTOMY  01/06/2002    Home Medications:  Allergies as of 01/08/2021       Reactions   Lovastatin    Sleep disturbance and extremity pains        Medication List        Accurate as of January 08, 2021  9:08 AM. If you have any questions, ask your nurse or doctor.          STOP taking these medications    cephALEXin 500 MG capsule Commonly known as: Keflex Stopped by: [name], MD  docusate sodium 100 MG capsule Commonly known as: COLACE Stopped by: [name], MD   oxybutynin 5 MG tablet Commonly known as: DITROPAN Stopped by: [name], MD   oxyCODONE-acetaminophen 5-325 MG tablet Commonly known as: PERCOCET/ROXICET Stopped by: [name], MD   polyethylene glycol-electrolytes 420 g solution Commonly known as: GaviLyte-N with Flavor Pack Stopped by: [name], MD       TAKE these medications    aspirin 81 MG tablet Take 81 mg by mouth daily.   ezetimibe 10 MG  tablet Commonly known as: ZETIA Take 1 tablet (10 mg total) by mouth daily. What changed: when to take this   fish oil-omega-3 fatty acids 1000 MG capsule Take 1 g by mouth daily.   multivitamin with minerals tablet Take 1 tablet by mouth daily.   omeprazole 40 MG capsule Commonly known as: PRILOSEC Take 1 capsule (40 mg total) by mouth daily. What changed: when to take this   pregabalin 50 MG capsule Commonly known as: LYRICA Take 1 capsule (50 mg total) by mouth 3 (three) times daily. What changed: when to take this   zolpidem 12.5 MG CR tablet Commonly known as: AMBIEN CR Take 1 tablet (12.5 mg total) by mouth at bedtime as needed.        Allergies:  Allergies  Allergen Reactions   Lovastatin     Sleep disturbance and extremity pains    Family History: Family History  Problem Relation Age of Onset   Heart disease Mother    Asthma Mother    Heart disease Father    CAD Father    Healthy Sister    Healthy Brother    Healthy Daughter    Healthy Daughter    Diabetes Maternal Aunt    Diabetes Maternal Uncle    Colon cancer Neg Hx     Social History:  reports that he has never smoked. He has never used smokeless tobacco. He reports that he does not drink alcohol and does not use drugs.   Physical Exam: BP 129/80   Pulse (!) 55   Ht 5\' 10"  (1.778 m)   Wt 170 lb (77.1 kg)   BMI 24.39 kg/m   Constitutional:  Alert and oriented, No acute distress. HEENT: [place] AT, moist mucus membranes.  Trachea midline, no masses. Cardiovascular: No clubbing, cyanosis, or edema. Respiratory: Normal respiratory effort, no increased work of breathing. Skin: No rashes, bruises or suspicious lesions. GI: wounds healing well, no hernia.   Neurologic: Grossly intact, no focal deficits, moving all 4 extremities. Psychiatric: Normal mood and affect.   Assessment & Plan:   Prostate cancer  -Surgical pathology consistent with intraductal with positive margin at the left base,  surprising pathology based on his previous biopsies -Unfortunately today his PSA remains detectable however this was drawn last week and possibly somewhat prematurely. - Will have him repeat PSA in 2 weeks to see if it is still persistent.  - PSA; Future (2 weeks).  - We discussed treatment if his PSA is still persistent. We discussed radiation, PET scan, and further evaluation/study of his prostate cancer at [facility] or [facility].  -Patient understands that intraductal has less favorable outcomes tends to be somewhat treatment resistant  2. Erectile dysfunction after prostatectomy  - Discussed the importance of blood flow to the penis to aid in recovery  - Recommend sildenafil to stretch and stimulate blood vessels in the penis for penile rehab.   3.  Stress urinary incontinence Continue pelvic floor exercises, should  improve with time   Follow-up in 2 weeks for PSA   I,Kailey Littlejohn,acting as a scribe for [name], MD.,have documented all relevant documentation on the behalf of [name], MD,as directed by  [name], MD while in the presence of [name], MD.  I have reviewed the above documentation for accuracy and completeness, and I agree with the above.   [name], MD   [facility] [address] [place], [place] [address] [phone]

## 2021-01-08 ENCOUNTER — Other Ambulatory Visit: Payer: Self-pay

## 2021-01-08 ENCOUNTER — Ambulatory Visit (INDEPENDENT_AMBULATORY_CARE_PROVIDER_SITE_OTHER): Payer: BC Managed Care – PPO | Admitting: Urology

## 2021-01-08 ENCOUNTER — Encounter: Payer: Self-pay | Admitting: Urology

## 2021-01-08 VITALS — BP 129/80 | HR 55 | Ht 70.0 in | Wt 170.0 lb

## 2021-01-08 DIAGNOSIS — C61 Malignant neoplasm of prostate: Secondary | ICD-10-CM

## 2021-01-08 MED ORDER — SILDENAFIL CITRATE 20 MG PO TABS: ORAL_TABLET | ORAL | 3 refills | Status: DC

## 2021-01-09 ENCOUNTER — Ambulatory Visit: Payer: BC Managed Care – PPO | Attending: Urology | Admitting: Physical Therapy

## 2021-01-09 ENCOUNTER — Encounter: Payer: Self-pay | Admitting: Physical Therapy

## 2021-01-09 DIAGNOSIS — R293 Abnormal posture: Secondary | ICD-10-CM | POA: Insufficient documentation

## 2021-01-09 DIAGNOSIS — R278 Other lack of coordination: Secondary | ICD-10-CM | POA: Insufficient documentation

## 2021-01-09 DIAGNOSIS — M6281 Muscle weakness (generalized): Secondary | ICD-10-CM | POA: Diagnosis not present

## 2021-01-09 NOTE — Therapy (Signed)
[place] [facility] [address] [place], [place], [address] Phone: [phone]   Fax:  [phone]  Physical Therapy Treatment  Patient Details  Name: Dale Gray MRN: [id number] Date of Birth: 10/19/1955 Referring Provider (PT): [name]   Encounter Date: 01/09/2021   PT End of Session - 01/09/21 0959     Visit Number 5    Number of Visits 18    Date for PT Re-Evaluation 03/21/21    PT Start Time 0945    PT Stop Time 1025    PT Time Calculation (min) 40 min    Activity Tolerance Patient tolerated treatment well    Behavior During Therapy [facility] for tasks assessed/performed             Past Medical History:  Diagnosis Date   Cancer ([place])    prostate   Complication of anesthesia    hard time waking up   GERD (gastroesophageal reflux disease)    Hemorrhoid    Hyperlipidemia    Joint pain    PONV (postoperative nausea and vomiting)    after 1st ear surgery   Reflux     Past Surgical History:  Procedure Laterality Date   ANAL FISSURE REPAIR  2014   CARPAL TUNNEL RELEASE Right    and elbow surgery   COLONOSCOPY WITH PROPOFOL N/A 03/19/2017   Procedure: COLONOSCOPY WITH PROPOFOL;  Surgeon: Virgel Manifold, MD;  Location: [facility] ENDOSCOPY;  Service: Endoscopy;  Laterality: N/A;   COLONOSCOPY WITH PROPOFOL N/A 08/20/2020   Procedure: COLONOSCOPY WITH PROPOFOL;  Surgeon: Lucilla Lame, MD;  Location: [facility] ENDOSCOPY;  Service: Endoscopy;  Laterality: N/A;   ESOPHAGOGASTRODUODENOSCOPY (EGD) WITH PROPOFOL N/A 08/20/2020   Procedure: ESOPHAGOGASTRODUODENOSCOPY (EGD) WITH PROPOFOL;  Surgeon: Lucilla Lame, MD;  Location: [facility] ENDOSCOPY;  Service: Endoscopy;  Laterality: N/A;   PELVIC LYMPH NODE DISSECTION Bilateral 12/02/2020   Procedure: PELVIC LYMPH NODE DISSECTION;  Surgeon: [name], MD;  Location: [facility] ORS;  Service: Urology;  Laterality: Bilateral;   ROBOT ASSISTED LAPAROSCOPIC RADICAL PROSTATECTOMY N/A  12/02/2020   Procedure: XI ROBOTIC ASSISTED LAPAROSCOPIC RADICAL PROSTATECTOMY;  Surgeon: [name], MD;  Location: [facility] ORS;  Service: Urology;  Laterality: N/A;   TYMPANOPLASTY Left 04/30/2014   TYMPANOPLASTY  10/2014   VASECTOMY  01/06/2002    There were no vitals filed for this visit.   Subjective Assessment - 01/09/21 0956     Subjective Patient notes that his leakage has increased since last visit; presents bladder diary with noted 8-9 pads with heavy leakage/day throughout the day. Patient has been doing abdominal exercises and had some mild soreness, but notes that resolved after 2 days of consistently doing exercises.    Pertinent History s/p RP on 9/26; catheter removal on 10/3. Follow-up with urology [name]) on 01/08/21.    Currently in Pain? No/denies             TREATMENT Neuromuscular Re-education: Standing postural control/deep core coordination with PFM contraction:  Black theraband, chest press with coordinated breath  Lateral anti-rotation press with coordinated breath, B   Patient educated throughout session on appropriate technique and form using multi-modal cueing, HEP, and activity modification. Patient articulated understanding and returned demonstration.  Patient Response to interventions: Notes feeling muscle activation with standing exercises, lateral > chest  ASSESSMENT Patient presents to clinic with excellent   motivation to participate in therapy. Patient demonstrates deficits in posture, IAP management, PFM strength, PFM coordination. Patient with good form performing standing deep core  coordination activities during today's session. Patient will benefit from continued skilled therapeutic intervention to address remaining deficits in posture, IAP management, PFM strength, PFM coordination in order to increase function and improve overall QOL.      PT Long Term Goals - 12/27/20 1155       PT LONG TERM GOAL #1   Title Patient will  demonstrate independence with HEP in order to maximize therapeutic gains and improve carryover from physical therapy sessions to ADLs in the home and community.    Baseline IE: provided; 10/21: IND    Time 6    Period Weeks    Status Achieved      PT LONG TERM GOAL #2   Title Patient will demonstrate independent and coordinated diaphragmatic breathing in supine with a 1:2 breathing pattern for improved down-regulation of the nervous system and improved management of intra-abdominal pressures in order to increase function at home and in the community.    Baseline IE: not demonstrated; 10/21: IND    Time 6    Period Weeks    Status Achieved      PT LONG TERM GOAL #3   Title Patient will demonstrate improved strength and coordination of TrA as evidenced by ability to perform Sahrmann Abdominal Rehabilitation exercises through phase 5 as well as proper lifting mechanics for object weighing 25 lbs to minimize SUI s/p surgical intervention.    Baseline IE: not demonstrated; 10/21: able to perform through phase 2    Time 12    Period Weeks    Status On-going    Target Date 03/21/21      PT LONG TERM GOAL #4   Title Patient will demonstrate circumferential and sequential contraction of >4/5 MMT, > 6 sec hold x10 and 5 consecutive quick flicks with </= 10 min rest between testing bouts, and relaxation of the PFM coordinated with breath for improved management of intra-abdominal pressure and normal bowel and bladder function without the presence of pain nor incontinence in order to improve participation at home and in the community.    Baseline IE: not demonstrated; 10/21: 3/5 MMT, x3 consecutive contractions before quality changed    Time 12    Period Weeks    Status On-going    Target Date 03/21/21      PT LONG TERM GOAL #5   Title Patient will report protective undergarment usage to be less than 3 pads and 2 briefs per day with activity and will note pad weight to be "light" throughout the day  in order to demonstrate improved bladder control for participation at home and in the community.    Baseline 10/21: 3 briefs w/ 7 pads/day    Time 12    Period Weeks    Status New    Target Date 03/21/21      Additional Long Term Goals   Additional Long Term Goals Yes      PT LONG TERM GOAL #6   Title Patient will report nocturia to be 1-2x/night (commensurate with peer group) and without any UI in order to demonstrate improved bladder control and allow for improved sleep hygiene.    Baseline 10/21: 3-4x/night, "light" UI    Time 12    Period Weeks    Status New    Target Date 03/21/21                   Plan - 01/09/21 1320     Clinical Impression Statement Patient presents to clinic with excellent  motivation to participate in therapy. Patient demonstrates deficits in posture, IAP management, PFM strength, PFM coordination. Patient with good form performing standing deep core coordination activities during today's session. Patient will benefit from continued skilled therapeutic intervention to address remaining deficits in posture, IAP management, PFM strength, PFM coordination in order to increase function and improve overall QOL.    Personal Factors and Comorbidities Comorbidity 3+;Age;Behavior Pattern    Comorbidities GERD, prostate cancer, anal fissure, insomnia, anemia, neuropathy    Examination-Activity Limitations Toileting    Examination-Participation Restrictions Occupation    Stability/Clinical Decision Making Evolving/Moderate complexity    Rehab Potential Good    PT Frequency 1x / week    PT Duration 12 weeks    PT Treatment/Interventions Cryotherapy;Electrical Stimulation;Moist Heat;Therapeutic exercise;Neuromuscular re-education;Patient/family education;Manual techniques;Taping;Joint Manipulations;Spinal Manipulations    PT Next Visit Plan PFM progression    PT Home Exercise Plan Access Code: [email or web address]    Consulted and Agree with Plan of Care  Patient             Patient will benefit from skilled therapeutic intervention in order to improve the following deficits and impairments:  Postural dysfunction, Improper body mechanics, Decreased coordination, Decreased strength, Decreased endurance  Visit Diagnosis: Other lack of coordination  Muscle weakness (generalized)  Abnormal posture     Problem List Patient Active Problem List   Diagnosis Date Noted   Prostate cancer ([place]) 12/02/2020   Abnormal findings on diagnostic imaging of digestive system    Gastric polyp    Neck pain 12/29/2017   Cervical radiculitis 09/23/2017   DDD (degenerative disc disease), cervical 09/23/2017   Special screening for malignant neoplasms, colon    Personal history of colonic polyps    Benign neoplasm of ascending colon    Peripheral neuropathy 01/24/2015   Anal fissure 01/22/2015   Disorder of coccyx 01/22/2015   History of digestive disease 01/22/2015   HLD (hyperlipidemia) 01/22/2015   Anemia, iron deficiency 01/22/2015   Yeast dermatitis 06/22/2012   Thrombosed hemorrhoids    Fam hx-ischem heart disease 07/20/2007   Acid reflux 05/26/2006   Cannot sleep 10/12/2005   Hypercholesterolemia without hypertriglyceridemia 10/12/2005    Myles Gip PT, DPT [phone]  01/09/2021, 1:26 PM  [place] [facility] [address] [place], [place], [address] Phone: [phone]   Fax:  [phone]  Name: Dale Gray MRN: [id number] Date of Birth: 1955-11-03

## 2021-01-23 ENCOUNTER — Other Ambulatory Visit: Payer: Self-pay

## 2021-01-23 ENCOUNTER — Ambulatory Visit: Payer: BC Managed Care – PPO | Admitting: Physical Therapy

## 2021-01-23 ENCOUNTER — Encounter: Payer: Self-pay | Admitting: Physical Therapy

## 2021-01-23 DIAGNOSIS — M6281 Muscle weakness (generalized): Secondary | ICD-10-CM | POA: Diagnosis not present

## 2021-01-23 DIAGNOSIS — R293 Abnormal posture: Secondary | ICD-10-CM | POA: Diagnosis not present

## 2021-01-23 DIAGNOSIS — R278 Other lack of coordination: Secondary | ICD-10-CM | POA: Diagnosis not present

## 2021-01-23 NOTE — Therapy (Signed)
[place] [facility] [address] [place], [place], [address] Phone: [phone]   Fax:  [phone]  Physical Therapy Treatment  Patient Details  Name: EHAN FREAS MRN: [id number] Date of Birth: 1955-04-23 Referring Provider (PT): [name]   Encounter Date: 01/23/2021   PT End of Session - 01/23/21 1412     Visit Number 6    Number of Visits 18    Date for PT Re-Evaluation 03/21/21    PT Start Time 0945    PT Stop Time 1025    PT Time Calculation (min) 40 min    Activity Tolerance Patient tolerated treatment well    Behavior During Therapy [facility] for tasks assessed/performed             Past Medical History:  Diagnosis Date   Cancer ([place])    prostate   Complication of anesthesia    hard time waking up   GERD (gastroesophageal reflux disease)    Hemorrhoid    Hyperlipidemia    Joint pain    PONV (postoperative nausea and vomiting)    after 1st ear surgery   Reflux     Past Surgical History:  Procedure Laterality Date   ANAL FISSURE REPAIR  2014   CARPAL TUNNEL RELEASE Right    and elbow surgery   COLONOSCOPY WITH PROPOFOL N/A 03/19/2017   Procedure: COLONOSCOPY WITH PROPOFOL;  Surgeon: Virgel Manifold, MD;  Location: [facility] ENDOSCOPY;  Service: Endoscopy;  Laterality: N/A;   COLONOSCOPY WITH PROPOFOL N/A 08/20/2020   Procedure: COLONOSCOPY WITH PROPOFOL;  Surgeon: Lucilla Lame, MD;  Location: [facility] ENDOSCOPY;  Service: Endoscopy;  Laterality: N/A;   ESOPHAGOGASTRODUODENOSCOPY (EGD) WITH PROPOFOL N/A 08/20/2020   Procedure: ESOPHAGOGASTRODUODENOSCOPY (EGD) WITH PROPOFOL;  Surgeon: Lucilla Lame, MD;  Location: [facility] ENDOSCOPY;  Service: Endoscopy;  Laterality: N/A;   PELVIC LYMPH NODE DISSECTION Bilateral 12/02/2020   Procedure: PELVIC LYMPH NODE DISSECTION;  Surgeon: [name], MD;  Location: [facility] ORS;  Service: Urology;  Laterality: Bilateral;   ROBOT ASSISTED LAPAROSCOPIC RADICAL PROSTATECTOMY N/A  12/02/2020   Procedure: XI ROBOTIC ASSISTED LAPAROSCOPIC RADICAL PROSTATECTOMY;  Surgeon: [name], MD;  Location: [facility] ORS;  Service: Urology;  Laterality: N/A;   TYMPANOPLASTY Left 04/30/2014   TYMPANOPLASTY  10/2014   VASECTOMY  01/06/2002    There were no vitals filed for this visit.   Subjective Assessment - 01/23/21 0949     Subjective Patient reports no changes since last session. Patient does enodrse that he is getting more sensation. Patient notes that he is now getting sensation whenever he is standing. He is emptying with the sensation but notes that he frequently empties very little. Frequency is up to every 15 minutes.    Pertinent History s/p RP on 9/26; catheter removal on 10/3. Follow-up with urology [name]) on 01/08/21.    Currently in Pain? No/denies             TREATMENT Neuromuscular Re-education: Patient educated extensively on typical bladder function, typical bladder habits, basics of urge suppression, and time voiding in order to better regulate bladder through behavioral changes.    Patient educated throughout session on appropriate technique and form using multi-modal cueing, HEP, and activity modification. Patient articulated understanding and returned demonstration.  Patient Response to interventions: Comfortable to trial timed voiding and urge suppression  ASSESSMENT Patient presents to clinic with excellent   motivation to participate in therapy. Patient demonstrates deficits in posture, IAP management, PFM strength, PFM coordination. Patient articulating understanding  of timed voiding and importance of urge suppression to manage frequency during today's session. Patient will benefit from continued skilled therapeutic intervention to address remaining deficits in posture, IAP management, PFM strength, PFM coordination in order to increase function and improve overall QOL.      PT Long Term Goals - 12/27/20 1155       PT LONG TERM GOAL #1    Title Patient will demonstrate independence with HEP in order to maximize therapeutic gains and improve carryover from physical therapy sessions to ADLs in the home and community.    Baseline IE: provided; 10/21: IND    Time 6    Period Weeks    Status Achieved      PT LONG TERM GOAL #2   Title Patient will demonstrate independent and coordinated diaphragmatic breathing in supine with a 1:2 breathing pattern for improved down-regulation of the nervous system and improved management of intra-abdominal pressures in order to increase function at home and in the community.    Baseline IE: not demonstrated; 10/21: IND    Time 6    Period Weeks    Status Achieved      PT LONG TERM GOAL #3   Title Patient will demonstrate improved strength and coordination of TrA as evidenced by ability to perform Sahrmann Abdominal Rehabilitation exercises through phase 5 as well as proper lifting mechanics for object weighing 25 lbs to minimize SUI s/p surgical intervention.    Baseline IE: not demonstrated; 10/21: able to perform through phase 2    Time 12    Period Weeks    Status On-going    Target Date 03/21/21      PT LONG TERM GOAL #4   Title Patient will demonstrate circumferential and sequential contraction of >4/5 MMT, > 6 sec hold x10 and 5 consecutive quick flicks with </= 10 min rest between testing bouts, and relaxation of the PFM coordinated with breath for improved management of intra-abdominal pressure and normal bowel and bladder function without the presence of pain nor incontinence in order to improve participation at home and in the community.    Baseline IE: not demonstrated; 10/21: 3/5 MMT, x3 consecutive contractions before quality changed    Time 12    Period Weeks    Status On-going    Target Date 03/21/21      PT LONG TERM GOAL #5   Title Patient will report protective undergarment usage to be less than 3 pads and 2 briefs per day with activity and will note pad weight to be "light"  throughout the day in order to demonstrate improved bladder control for participation at home and in the community.    Baseline 10/21: 3 briefs w/ 7 pads/day    Time 12    Period Weeks    Status New    Target Date 03/21/21      Additional Long Term Goals   Additional Long Term Goals Yes      PT LONG TERM GOAL #6   Title Patient will report nocturia to be 1-2x/night (commensurate with peer group) and without any UI in order to demonstrate improved bladder control and allow for improved sleep hygiene.    Baseline 10/21: 3-4x/night, "light" UI    Time 12    Period Weeks    Status New    Target Date 03/21/21                   Plan - 01/23/21 1413  Clinical Impression Statement Patient presents to clinic with excellent   motivation to participate in therapy. Patient demonstrates deficits in posture, IAP management, PFM strength, PFM coordination. Patient articulating understanding of timed voiding and importance of urge suppression to manage frequency during today's session. Patient will benefit from continued skilled therapeutic intervention to address remaining deficits in posture, IAP management, PFM strength, PFM coordination in order to increase function and improve overall QOL.    Personal Factors and Comorbidities Comorbidity 3+;Age;Behavior Pattern    Comorbidities GERD, prostate cancer, anal fissure, insomnia, anemia, neuropathy    Examination-Activity Limitations Toileting    Examination-Participation Restrictions Occupation    Stability/Clinical Decision Making Evolving/Moderate complexity    Rehab Potential Good    PT Frequency 1x / week    PT Duration 12 weeks    PT Treatment/Interventions Cryotherapy;Electrical Stimulation;Moist Heat;Therapeutic exercise;Neuromuscular re-education;Patient/family education;Manual techniques;Taping;Joint Manipulations;Spinal Manipulations    PT Next Visit Plan PFM progression    PT Home Exercise Plan Access Code: [email or web address]     Consulted and Agree with Plan of Care Patient             Patient will benefit from skilled therapeutic intervention in order to improve the following deficits and impairments:  Postural dysfunction, Improper body mechanics, Decreased coordination, Decreased strength, Decreased endurance  Visit Diagnosis: Other lack of coordination  Muscle weakness (generalized)  Abnormal posture     Problem List Patient Active Problem List   Diagnosis Date Noted   Prostate cancer ([place]) 12/02/2020   Abnormal findings on diagnostic imaging of digestive system    Gastric polyp    Neck pain 12/29/2017   Cervical radiculitis 09/23/2017   DDD (degenerative disc disease), cervical 09/23/2017   Special screening for malignant neoplasms, colon    Personal history of colonic polyps    Benign neoplasm of ascending colon    Peripheral neuropathy 01/24/2015   Anal fissure 01/22/2015   Disorder of coccyx 01/22/2015   History of digestive disease 01/22/2015   HLD (hyperlipidemia) 01/22/2015   Anemia, iron deficiency 01/22/2015   Yeast dermatitis 06/22/2012   Thrombosed hemorrhoids    Fam hx-ischem heart disease 07/20/2007   Acid reflux 05/26/2006   Cannot sleep 10/12/2005   Hypercholesterolemia without hypertriglyceridemia 10/12/2005    Myles Gip PT, DPT [phone]  01/23/2021, 2:17 PM  [place] [facility] [address] [place], [place], [address] Phone: [phone]   Fax:  [phone]  Name: BREVIN MCFADDEN MRN: [id number] Date of Birth: [birth year]

## 2021-01-24 ENCOUNTER — Other Ambulatory Visit: Payer: Self-pay

## 2021-01-24 ENCOUNTER — Other Ambulatory Visit: Payer: BC Managed Care – PPO

## 2021-01-24 DIAGNOSIS — C61 Malignant neoplasm of prostate: Secondary | ICD-10-CM

## 2021-01-25 LAB — PSA: Prostate Specific Ag, Serum: 2.2 ng/mL (ref 0.0–4.0)

## 2021-01-27 ENCOUNTER — Telehealth: Payer: Self-pay | Admitting: Urology

## 2021-01-27 DIAGNOSIS — C61 Malignant neoplasm of prostate: Secondary | ICD-10-CM

## 2021-01-27 NOTE — Telephone Encounter (Signed)
Called patient to discuss.  PSA persist albeit has gone down ever so slightly.  We will plan for PSMA PET scan and follow-up with me thereafter.  We will consider radiation and adjuvant hormonal therapy depending on these results.  We also discussed the option for second opinion at [facility].  We will discuss this further at his follow-up.  [name], MD

## 2021-02-06 ENCOUNTER — Ambulatory Visit: Payer: BC Managed Care – PPO | Attending: Urology | Admitting: Physical Therapy

## 2021-02-06 ENCOUNTER — Other Ambulatory Visit: Payer: Self-pay

## 2021-02-06 DIAGNOSIS — R278 Other lack of coordination: Secondary | ICD-10-CM | POA: Diagnosis not present

## 2021-02-06 DIAGNOSIS — R293 Abnormal posture: Secondary | ICD-10-CM | POA: Diagnosis not present

## 2021-02-06 DIAGNOSIS — M6281 Muscle weakness (generalized): Secondary | ICD-10-CM | POA: Diagnosis not present

## 2021-02-06 NOTE — Therapy (Signed)
[place] [address] [place], [place], [address] Phone: [phone]   Fax:  [phone]  Physical Therapy Treatment  Patient Details  Name: Dale Gray MRN: [id number] Date of Birth: 1955/09/18 Referring Provider (PT): [name]   Encounter Date: 02/06/2021   PT End of Session - 02/06/21 1707     Visit Number 7    Number of Visits 18    Date for PT Re-Evaluation 03/21/21    PT Start Time 7169    PT Stop Time 1740    PT Time Calculation (min) 30 min    Activity Tolerance Patient tolerated treatment well    Behavior During Therapy [facility] for tasks assessed/performed             Past Medical History:  Diagnosis Date   Cancer ([place])    prostate   Complication of anesthesia    hard time waking up   GERD (gastroesophageal reflux disease)    Hemorrhoid    Hyperlipidemia    Joint pain    PONV (postoperative nausea and vomiting)    after 1st ear surgery   Reflux     Past Surgical History:  Procedure Laterality Date   ANAL FISSURE REPAIR  2014   CARPAL TUNNEL RELEASE Right    and elbow surgery   COLONOSCOPY WITH PROPOFOL N/A 03/19/2017   Procedure: COLONOSCOPY WITH PROPOFOL;  Surgeon: Virgel Manifold, MD;  Location: [facility] ENDOSCOPY;  Service: Endoscopy;  Laterality: N/A;   COLONOSCOPY WITH PROPOFOL N/A 08/20/2020   Procedure: COLONOSCOPY WITH PROPOFOL;  Surgeon: Lucilla Lame, MD;  Location: [facility] ENDOSCOPY;  Service: Endoscopy;  Laterality: N/A;   ESOPHAGOGASTRODUODENOSCOPY (EGD) WITH PROPOFOL N/A 08/20/2020   Procedure: ESOPHAGOGASTRODUODENOSCOPY (EGD) WITH PROPOFOL;  Surgeon: Lucilla Lame, MD;  Location: [facility] ENDOSCOPY;  Service: Endoscopy;  Laterality: N/A;   PELVIC LYMPH NODE DISSECTION Bilateral 12/02/2020   Procedure: PELVIC LYMPH NODE DISSECTION;  Surgeon: [name], MD;  Location: [facility] ORS;  Service: Urology;  Laterality: Bilateral;   ROBOT ASSISTED LAPAROSCOPIC RADICAL PROSTATECTOMY N/A  12/02/2020   Procedure: XI ROBOTIC ASSISTED LAPAROSCOPIC RADICAL PROSTATECTOMY;  Surgeon: [name], MD;  Location: [facility] ORS;  Service: Urology;  Laterality: N/A;   TYMPANOPLASTY Left 04/30/2014   TYMPANOPLASTY  10/2014   VASECTOMY  01/06/2002    There were no vitals filed for this visit.   Subjective Assessment - 02/06/21 1752     Subjective Patient notes continued frustration with progress. He reports that he continues to have significant UI throughout his workday. This morning he used 5 pads prior to lunch. Patient endorses working ~12 hour days with significant physical demands. Patient notes that on Sundays when he is not working his pad usage is approximately 50% of a working day, but adds that his activity level is much less. Patient has been doing PFM contractions and deep core training; patient is consumign roughly 1 gal of water/day. Patient notes limited success with timed voids; has trialed 60 minutes, 90 minutes, and 120 minutes. Notes that when he gets to the bathroom for a timed void, he has already soaked a pad and has limited urine left to empty.    Pertinent History s/p RP on 9/26; catheter removal on 10/3. Follow-up with urology [name]) on 01/08/21.    Currently in Pain? No/denies             TREATMENT Neuromuscular Re-education: Discussed modifications to HEP with patient. Reducing overall quantity of PFM contractions in the context of postural  demands for occupation. Reducing fluid intake to roughly 80 oz to limit demand/burden on PFM and bladder and for improved continence. Current HEP: PFM quick flicks, up to 10 reps x6 daily, endurance up to 10 sec hold x10 reps x3 daily   Patient educated throughout session on appropriate technique and form using multi-modal cueing, HEP, and activity modification. Patient articulated understanding and returned demonstration.  Patient Response to interventions: Patient to call after PET scan and follow up with [name]  (urology)  ASSESSMENT Patient presents to clinic with excellent   motivation to participate in therapy. Patient demonstrates deficits in posture, IAP management, PFM strength, PFM coordination. Patient with improved continence on low physical demand days with 50% less reliance on pad usage than work days, but continues to deal with increased UI with increased physical demand and the associated impacts on QoL. Patient and DPT made modifications to HEP and to fluid intake to optimize PFM function and endurance in order to meet occupational demands. Patient will benefit from continued skilled therapeutic intervention to address remaining deficits in posture, IAP management, PFM strength, PFM coordination in order to increase function and improve overall QOL.       PT Long Term Goals - 12/27/20 1155       PT LONG TERM GOAL #1   Title Patient will demonstrate independence with HEP in order to maximize therapeutic gains and improve carryover from physical therapy sessions to ADLs in the home and community.    Baseline IE: provided; 10/21: IND    Time 6    Period Weeks    Status Achieved      PT LONG TERM GOAL #2   Title Patient will demonstrate independent and coordinated diaphragmatic breathing in supine with a 1:2 breathing pattern for improved down-regulation of the nervous system and improved management of intra-abdominal pressures in order to increase function at home and in the community.    Baseline IE: not demonstrated; 10/21: IND    Time 6    Period Weeks    Status Achieved      PT LONG TERM GOAL #3   Title Patient will demonstrate improved strength and coordination of TrA as evidenced by ability to perform Sahrmann Abdominal Rehabilitation exercises through phase 5 as well as proper lifting mechanics for object weighing 25 lbs to minimize SUI s/p surgical intervention.    Baseline IE: not demonstrated; 10/21: able to perform through phase 2    Time 12    Period Weeks    Status  On-going    Target Date 03/21/21      PT LONG TERM GOAL #4   Title Patient will demonstrate circumferential and sequential contraction of >4/5 MMT, > 6 sec hold x10 and 5 consecutive quick flicks with </= 10 min rest between testing bouts, and relaxation of the PFM coordinated with breath for improved management of intra-abdominal pressure and normal bowel and bladder function without the presence of pain nor incontinence in order to improve participation at home and in the community.    Baseline IE: not demonstrated; 10/21: 3/5 MMT, x3 consecutive contractions before quality changed    Time 12    Period Weeks    Status On-going    Target Date 03/21/21      PT LONG TERM GOAL #5   Title Patient will report protective undergarment usage to be less than 3 pads and 2 briefs per day with activity and will note pad weight to be "light" throughout the day in order to  demonstrate improved bladder control for participation at home and in the community.    Baseline 10/21: 3 briefs w/ 7 pads/day    Time 12    Period Weeks    Status New    Target Date 03/21/21      Additional Long Term Goals   Additional Long Term Goals Yes      PT LONG TERM GOAL #6   Title Patient will report nocturia to be 1-2x/night (commensurate with peer group) and without any UI in order to demonstrate improved bladder control and allow for improved sleep hygiene.    Baseline 10/21: 3-4x/night, "light" UI    Time 12    Period Weeks    Status New    Target Date 03/21/21                   Plan - 02/06/21 1707     Clinical Impression Statement Patient presents to clinic with excellent   motivation to participate in therapy. Patient demonstrates deficits in posture, IAP management, PFM strength, PFM coordination. Patient with improved continence on low physical demand days with 50% less reliance on pad usage than work days, but continues to deal with increased UI with increased physical demand and the associated  impacts on QoL. Patient and DPT made modifications to HEP and to fluid intake to optimize PFM function and endurance in order to meet occupational demands. Patient will benefit from continued skilled therapeutic intervention to address remaining deficits in posture, IAP management, PFM strength, PFM coordination in order to increase function and improve overall QOL.    Personal Factors and Comorbidities Comorbidity 3+;Age;Behavior Pattern    Comorbidities GERD, prostate cancer, anal fissure, insomnia, anemia, neuropathy    Examination-Activity Limitations Toileting    Examination-Participation Restrictions Occupation    Stability/Clinical Decision Making Evolving/Moderate complexity    Rehab Potential Good    PT Frequency 1x / week    PT Duration 12 weeks    PT Treatment/Interventions Cryotherapy;Electrical Stimulation;Moist Heat;Therapeutic exercise;Neuromuscular re-education;Patient/family education;Manual techniques;Taping;Joint Manipulations;Spinal Manipulations    PT Next Visit Plan PFM progression    PT Home Exercise Plan Access Code: [email or web address]    Consulted and Agree with Plan of Care Patient             Patient will benefit from skilled therapeutic intervention in order to improve the following deficits and impairments:  Postural dysfunction, Improper body mechanics, Decreased coordination, Decreased strength, Decreased endurance  Visit Diagnosis: Other lack of coordination  Muscle weakness (generalized)  Abnormal posture     Problem List Patient Active Problem List   Diagnosis Date Noted   Prostate cancer ([place]) 12/02/2020   Abnormal findings on diagnostic imaging of digestive system    Gastric polyp    Neck pain 12/29/2017   Cervical radiculitis 09/23/2017   DDD (degenerative disc disease), cervical 09/23/2017   Special screening for malignant neoplasms, colon    Personal history of colonic polyps    Benign neoplasm of ascending colon    Peripheral  neuropathy 01/24/2015   Anal fissure 01/22/2015   Disorder of coccyx 01/22/2015   History of digestive disease 01/22/2015   HLD (hyperlipidemia) 01/22/2015   Anemia, iron deficiency 01/22/2015   Yeast dermatitis 06/22/2012   Thrombosed hemorrhoids    Fam hx-ischem heart disease 07/20/2007   Acid reflux 05/26/2006   Cannot sleep 10/12/2005   Hypercholesterolemia without hypertriglyceridemia 10/12/2005    Myles Gip PT, DPT [phone]  02/06/2021, 6:10 PM  [place]  [facility] [address] [place], [place], [address] Phone: [phone]   Fax:  [phone]  Name: Dale Gray MRN: [id number] Date of Birth: 1955-12-25

## 2021-02-06 NOTE — Therapy (Signed)
[place] [facility] [address] [place], [place], [address] Phone: [phone]   Fax:  [phone]  Physical Therapy Treatment  Patient Details  Name: ARHAM SYMMONDS MRN: [id number] Date of Birth: 1956-02-03 Referring Provider (PT): [name]   Encounter Date: 02/06/2021   PT End of Session - 02/06/21 1707     Visit Number 7    Number of Visits 18    Date for PT Re-Evaluation 03/21/21    PT Start Time 8786    PT Stop Time 1750    PT Time Calculation (min) 40 min    Activity Tolerance Patient tolerated treatment well    Behavior During Therapy [facility] for tasks assessed/performed             Past Medical History:  Diagnosis Date   Cancer ([place])    prostate   Complication of anesthesia    hard time waking up   GERD (gastroesophageal reflux disease)    Hemorrhoid    Hyperlipidemia    Joint pain    PONV (postoperative nausea and vomiting)    after 1st ear surgery   Reflux     Past Surgical History:  Procedure Laterality Date   ANAL FISSURE REPAIR  2014   CARPAL TUNNEL RELEASE Right    and elbow surgery   COLONOSCOPY WITH PROPOFOL N/A 03/19/2017   Procedure: COLONOSCOPY WITH PROPOFOL;  Surgeon: Virgel Manifold, MD;  Location: [facility] ENDOSCOPY;  Service: Endoscopy;  Laterality: N/A;   COLONOSCOPY WITH PROPOFOL N/A 08/20/2020   Procedure: COLONOSCOPY WITH PROPOFOL;  Surgeon: Lucilla Lame, MD;  Location: [facility] ENDOSCOPY;  Service: Endoscopy;  Laterality: N/A;   ESOPHAGOGASTRODUODENOSCOPY (EGD) WITH PROPOFOL N/A 08/20/2020   Procedure: ESOPHAGOGASTRODUODENOSCOPY (EGD) WITH PROPOFOL;  Surgeon: Lucilla Lame, MD;  Location: [facility] ENDOSCOPY;  Service: Endoscopy;  Laterality: N/A;   PELVIC LYMPH NODE DISSECTION Bilateral 12/02/2020   Procedure: PELVIC LYMPH NODE DISSECTION;  Surgeon: [name], MD;  Location: [facility] ORS;  Service: Urology;  Laterality: Bilateral;   ROBOT ASSISTED LAPAROSCOPIC RADICAL PROSTATECTOMY N/A  12/02/2020   Procedure: XI ROBOTIC ASSISTED LAPAROSCOPIC RADICAL PROSTATECTOMY;  Surgeon: [name], MD;  Location: [facility] ORS;  Service: Urology;  Laterality: N/A;   TYMPANOPLASTY Left 04/30/2014   TYMPANOPLASTY  10/2014   VASECTOMY  01/06/2002    There were no vitals filed for this visit.                                    PT Long Term Goals - 12/27/20 1155       PT LONG TERM GOAL #1   Title Patient will demonstrate independence with HEP in order to maximize therapeutic gains and improve carryover from physical therapy sessions to ADLs in the home and community.    Baseline IE: provided; 10/21: IND    Time 6    Period Weeks    Status Achieved      PT LONG TERM GOAL #2   Title Patient will demonstrate independent and coordinated diaphragmatic breathing in supine with a 1:2 breathing pattern for improved down-regulation of the nervous system and improved management of intra-abdominal pressures in order to increase function at home and in the community.    Baseline IE: not demonstrated; 10/21: IND    Time 6    Period Weeks    Status Achieved      PT LONG TERM GOAL #3   Title Patient  will demonstrate improved strength and coordination of TrA as evidenced by ability to perform Sahrmann Abdominal Rehabilitation exercises through phase 5 as well as proper lifting mechanics for object weighing 25 lbs to minimize SUI s/p surgical intervention.    Baseline IE: not demonstrated; 10/21: able to perform through phase 2    Time 12    Period Weeks    Status On-going    Target Date 03/21/21      PT LONG TERM GOAL #4   Title Patient will demonstrate circumferential and sequential contraction of >4/5 MMT, > 6 sec hold x10 and 5 consecutive quick flicks with </= 10 min rest between testing bouts, and relaxation of the PFM coordinated with breath for improved management of intra-abdominal pressure and normal bowel and bladder function without the presence of pain  nor incontinence in order to improve participation at home and in the community.    Baseline IE: not demonstrated; 10/21: 3/5 MMT, x3 consecutive contractions before quality changed    Time 12    Period Weeks    Status On-going    Target Date 03/21/21      PT LONG TERM GOAL #5   Title Patient will report protective undergarment usage to be less than 3 pads and 2 briefs per day with activity and will note pad weight to be "light" throughout the day in order to demonstrate improved bladder control for participation at home and in the community.    Baseline 10/21: 3 briefs w/ 7 pads/day    Time 12    Period Weeks    Status New    Target Date 03/21/21      Additional Long Term Goals   Additional Long Term Goals Yes      PT LONG TERM GOAL #6   Title Patient will report nocturia to be 1-2x/night (commensurate with peer group) and without any UI in order to demonstrate improved bladder control and allow for improved sleep hygiene.    Baseline 10/21: 3-4x/night, "light" UI    Time 12    Period Weeks    Status New    Target Date 03/21/21                   Plan - 02/06/21 1707     Personal Factors and Comorbidities Comorbidity 3+;Age;Behavior Pattern    Comorbidities GERD, prostate cancer, anal fissure, insomnia, anemia, neuropathy    Examination-Activity Limitations Toileting    Examination-Participation Restrictions Occupation    Stability/Clinical Decision Making Evolving/Moderate complexity    Rehab Potential Good    PT Frequency 1x / week    PT Duration 12 weeks    PT Treatment/Interventions Cryotherapy;Electrical Stimulation;Moist Heat;Therapeutic exercise;Neuromuscular re-education;Patient/family education;Manual techniques;Taping;Joint Manipulations;Spinal Manipulations    PT Next Visit Plan PFM progression    PT Home Exercise Plan Access Code: [email or web address]    Consulted and Agree with Plan of Care Patient             Patient will benefit from skilled  therapeutic intervention in order to improve the following deficits and impairments:  Postural dysfunction, Improper body mechanics, Decreased coordination, Decreased strength, Decreased endurance  Visit Diagnosis: Other lack of coordination  Muscle weakness (generalized)  Abnormal posture     Problem List Patient Active Problem List   Diagnosis Date Noted   Prostate cancer ([place]) 12/02/2020   Abnormal findings on diagnostic imaging of digestive system    Gastric polyp    Neck pain 12/29/2017   Cervical radiculitis 09/23/2017  DDD (degenerative disc disease), cervical 09/23/2017   Special screening for malignant neoplasms, colon    Personal history of colonic polyps    Benign neoplasm of ascending colon    Peripheral neuropathy 01/24/2015   Anal fissure 01/22/2015   Disorder of coccyx 01/22/2015   History of digestive disease 01/22/2015   HLD (hyperlipidemia) 01/22/2015   Anemia, iron deficiency 01/22/2015   Yeast dermatitis 06/22/2012   Thrombosed hemorrhoids    Fam hx-ischem heart disease 07/20/2007   Acid reflux 05/26/2006   Cannot sleep 10/12/2005   Hypercholesterolemia without hypertriglyceridemia 10/12/2005    Louie Casa, PT 02/06/2021, 5:08 PM  [facility] [address] [place], [place], [address] Phone: [phone]   Fax:  [phone]  Name: TEVITA GOMER MRN: [id number] Date of Birth: Jan 09, 1956

## 2021-02-07 NOTE — Telephone Encounter (Signed)
Scan was denied by insurance. Appeal started, apoke with Ivin Booty, RN at [facility] [phone]) Updated clinical information given, requested records and letter of appeal to be faxed as well for review. Records and letter were faxed to [phone] Attn: Appeals Team

## 2021-02-11 NOTE — Telephone Encounter (Signed)
Appeal Approved see media notes

## 2021-02-12 ENCOUNTER — Ambulatory Visit: Payer: BC Managed Care – PPO | Admitting: Urology

## 2021-02-18 ENCOUNTER — Ambulatory Visit
Admission: RE | Admit: 2021-02-18 | Discharge: 2021-02-18 | Disposition: A | Discharge status: Home / Self Care | Payer: BC Managed Care – PPO | Source: Ambulatory Visit | Attending: Urology | Admitting: Urology

## 2021-02-18 DIAGNOSIS — C61 Malignant neoplasm of prostate: Secondary | ICD-10-CM | POA: Insufficient documentation

## 2021-02-18 MED ORDER — PIFLIFOLASTAT F 18 (PYLARIFY) INJECTION
9.0000 | Freq: Once | INTRAVENOUS | Status: AC
  Administered 2021-02-18: 9.26 via INTRAVENOUS

## 2021-02-19 ENCOUNTER — Telehealth: Payer: Self-pay | Admitting: Urology

## 2021-02-19 DIAGNOSIS — C61 Malignant neoplasm of prostate: Secondary | ICD-10-CM

## 2021-02-19 NOTE — Telephone Encounter (Signed)
Patient scheduled in Jan for PSA

## 2021-02-19 NOTE — Telephone Encounter (Signed)
PET scan reviewed with patient, there is no evidence of disease currently outside or within the pelvis.  There is a persistent fluid collection in the pelvis which likely represents residual seminal vesicles which were not resected at the time which is increased slightly.  There is no obvious biochemical activity based on this particular PET scan within this particular area or any other side of the known positive margin.  We discussed at this point, continue pelvic floor exercises and we will plan to recheck his PSA in early January.  We will hold off any further or intervention at this time and continue to trend PSA for the time being.  Dale Espy, MD

## 2021-03-12 ENCOUNTER — Ambulatory Visit: Payer: BC Managed Care – PPO | Admitting: Dermatology

## 2021-03-12 ENCOUNTER — Other Ambulatory Visit: Payer: Self-pay

## 2021-03-12 DIAGNOSIS — L578 Other skin changes due to chronic exposure to nonionizing radiation: Secondary | ICD-10-CM | POA: Diagnosis not present

## 2021-03-12 DIAGNOSIS — L821 Other seborrheic keratosis: Secondary | ICD-10-CM | POA: Diagnosis not present

## 2021-03-12 DIAGNOSIS — L720 Epidermal cyst: Secondary | ICD-10-CM | POA: Diagnosis not present

## 2021-03-12 DIAGNOSIS — L570 Actinic keratosis: Secondary | ICD-10-CM

## 2021-03-12 NOTE — Patient Instructions (Addendum)

## 2021-03-12 NOTE — Progress Notes (Signed)
° °  New Patient Visit  Subjective  Dale Gray is a 66 y.o. male who presents for the following: Skin Problem (Check growths on the face and scalp ). The patient has spots, moles and lesions to be evaluated, some may be new or changing and the patient has concerns that these could be cancer.   The following portions of the chart were reviewed this encounter and updated as appropriate:   Tobacco   Allergies   Meds   Problems   Med Hx   Surg Hx   Fam Hx      Review of Systems:  No other skin or systemic complaints except as noted in HPI or Assessment and Plan.  Objective  Well appearing patient in no apparent distress; mood and affect are within normal limits.  A focused examination was performed including face,scalp. Relevant physical exam findings are noted in the Assessment and Plan.  face Smooth white papule(s).   scalp, temple x 19 (19) Erythematous thin papules/macules with gritty scale.    Assessment & Plan  [place] face  Benign-appearing.  Observation.  Call clinic for new or changing moles.  Recommend daily use of broad spectrum spf 30+ sunscreen to sun-exposed areas.    AK (actinic keratosis) (19) scalp, temple x 19  Actinic keratoses are precancerous spots that appear secondary to cumulative UV radiation exposure/sun exposure over time. They are chronic with expected duration over 1 year. A portion of actinic keratoses will progress to squamous cell carcinoma of the skin. It is not possible to reliably predict which spots will progress to skin cancer and so treatment is recommended to prevent development of skin cancer.  Recommend daily broad spectrum sunscreen SPF 30+ to sun-exposed areas, reapply every 2 hours as needed.  Recommend staying in the shade or wearing long sleeves, sun glasses (UVA+UVB protection) and wide brim hats (4-inch brim around the entire circumference of the hat). Call for new or changing lesions.   Destruction of lesion - scalp, temple x  19 Complexity: simple   Destruction method: cryotherapy   Informed consent: discussed and consent obtained   Timeout:  patient name, date of birth, surgical site, and procedure verified Lesion destroyed using liquid nitrogen: Yes   Region frozen until ice ball extended beyond lesion: Yes   Outcome: patient tolerated procedure well with no complications   Post-procedure details: wound care instructions given    Seborrheic Keratoses - Stuck-on, waxy, tan-brown papules and/or plaques  - Benign-appearing - Discussed benign etiology and prognosis. - Observe - Call for any changes  Actinic Damage - chronic, secondary to cumulative UV radiation exposure/sun exposure over time - diffuse scaly erythematous macules with underlying dyspigmentation - Recommend daily broad spectrum sunscreen SPF 30+ to sun-exposed areas, reapply every 2 hours as needed.  - Recommend staying in the shade or wearing long sleeves, sun glasses (UVA+UVB protection) and wide brim hats (4-inch brim around the entire circumference of the hat). - Call for new or changing lesions.   Return in about 6 months (around 09/09/2021) for Aks .  IMarye Round, CMA, am acting as scribe for Sarina Ser, MD .  Documentation: I have reviewed the above documentation for accuracy and completeness, and I agree with the above.  Sarina Ser, MD

## 2021-03-13 ENCOUNTER — Other Ambulatory Visit: Payer: BC Managed Care – PPO

## 2021-03-13 ENCOUNTER — Other Ambulatory Visit: Payer: Self-pay

## 2021-03-13 ENCOUNTER — Encounter: Payer: Self-pay | Admitting: Dermatology

## 2021-03-13 DIAGNOSIS — F5101 Primary insomnia: Secondary | ICD-10-CM

## 2021-03-13 DIAGNOSIS — C61 Malignant neoplasm of prostate: Secondary | ICD-10-CM

## 2021-03-13 DIAGNOSIS — G629 Polyneuropathy, unspecified: Secondary | ICD-10-CM

## 2021-03-13 MED ORDER — PREGABALIN 50 MG PO CAPS: 50.0000 mg | ORAL_CAPSULE | Freq: Three times a day (TID) | ORAL | 1 refills | Status: DC

## 2021-03-13 MED ORDER — EZETIMIBE 10 MG PO TABS: 10.0000 mg | ORAL_TABLET | Freq: Every day | ORAL | 4 refills | Status: DC

## 2021-03-13 MED ORDER — ZOLPIDEM TARTRATE ER 12.5 MG PO TBCR: 12.5000 mg | EXTENDED_RELEASE_TABLET | Freq: Every evening | ORAL | 0 refills | Status: DC | PRN

## 2021-03-13 MED ORDER — OMEPRAZOLE 40 MG PO CPDR: 40.0000 mg | DELAYED_RELEASE_CAPSULE | Freq: Every day | ORAL | 1 refills | Status: DC

## 2021-03-13 NOTE — Telephone Encounter (Signed)
LOV: 05/10/2020   NOV: 05/16/2021

## 2021-03-13 NOTE — Telephone Encounter (Signed)
Patient needs refills on Zetia, Omeprazole, Ambien, Pregabalin sent into [facility] on [address]

## 2021-03-13 NOTE — Telephone Encounter (Deleted)
{  Template Exceptions:1}

## 2021-03-14 LAB — PSA: Prostate Specific Ag, Serum: 2.5 ng/mL (ref 0.0–4.0)

## 2021-03-17 ENCOUNTER — Telehealth: Payer: Self-pay | Admitting: Urology

## 2021-03-17 NOTE — Telephone Encounter (Signed)
Pt called office asking for you and would like for you to return his call.

## 2021-03-18 NOTE — Telephone Encounter (Signed)
Spoke with patient, scheduled follow-up appointment.

## 2021-03-18 NOTE — Telephone Encounter (Signed)
Patient had some concerns regarding his PSA elevated. He still wants to know why his PSA is still elevated and not at 0 since his surgery.He also had some questions regarding the type of prostate cancer he was diagnosed with at first then undergoing treatment if there was a chance of another type of cancer elsewhere undiagnosed. Offered to schedule phone visit/virtual visit to discuss further?

## 2021-03-27 ENCOUNTER — Ambulatory Visit: Payer: BC Managed Care – PPO | Admitting: Urology

## 2021-03-31 NOTE — Progress Notes (Signed)
04/01/21 3:01 PM   Schneider Warchol Hench [birth year] [id number]  Referring provider:  No referring provider defined for this encounter. Chief Complaint  Patient presents with   Elevated PSA     HPI: Dale Gray is a 66 y.o.male  with a personal history of unfavorable intermediate risk prostate cancer, who presents today for PSA discussion.   He is now s/p DaVinci laparoscopic radical prostatectomy and bilateral pelvic lymph node dissection on 12/02/2020. Periprostatic fat pad, pelvic lymph node, right,  pelvic lymph node, left, and prostate were sent for pathology. Surgical pathology revealed negative for malignancy aside for prostate that revealed invasive prostatic ductal adenocarcinoma. Negative LN, BN, ECE however + margin at left base which was intraoperative technically difficult in the setting of massive vas dilation,proximity to rectum as well as absence of posterior plane.  His most recent PSA on 03/13/2021 was 2.5.   PET scan on 02/18/2021 visualized no evidence of local prostate cancer recurrence in the prostatectomy bed. No metastatic adenopathy. No visceral metastasis or skeletal metastasis  He is accompanied today by his daughter and wife.  His other daughter is available by phone today.   They have a lot of questions of the family about his cancer diagnosis and treatment moving forward.  They all would be on the same page and this requested this appointment today primarily to answer questions.  Continues to leak fairly significantly, 3 was saturated pads per day.  He started get more dry at nighttime.  Is not been working with physical therapy.  PSA trend:  Component Prostate Specific Ag, Serum  Latest Ref Rng & Units 0.0 - 4.0 ng/mL  03/10/2018 6.5 (H)  09/12/2018 7.3 (H)  10/25/2018 7.3 (H)  04/04/2019 9.1 (H)  07/31/2019 9.2 (H)  12/06/2019 5.0 (H)  05/10/2020 6.1 (H)  06/11/2020 9.3 (H)  12/30/2020 2.6  01/24/2021 2.2  03/13/2021 2.5    PMH: Past Medical History:   Diagnosis Date   Cancer ([place])    prostate   Complication of anesthesia    hard time waking up   GERD (gastroesophageal reflux disease)    Hemorrhoid    Hyperlipidemia    Joint pain    PONV (postoperative nausea and vomiting)    after 1st ear surgery   Reflux     Surgical History: Past Surgical History:  Procedure Laterality Date   ANAL FISSURE REPAIR  2014   CARPAL TUNNEL RELEASE Right    and elbow surgery   COLONOSCOPY WITH PROPOFOL N/A 03/19/2017   Procedure: COLONOSCOPY WITH PROPOFOL;  Surgeon: Virgel Manifold, MD;  Location: [facility] ENDOSCOPY;  Service: Endoscopy;  Laterality: N/A;   COLONOSCOPY WITH PROPOFOL N/A 08/20/2020   Procedure: COLONOSCOPY WITH PROPOFOL;  Surgeon: Lucilla Lame, MD;  Location: [facility] ENDOSCOPY;  Service: Endoscopy;  Laterality: N/A;   ESOPHAGOGASTRODUODENOSCOPY (EGD) WITH PROPOFOL N/A 08/20/2020   Procedure: ESOPHAGOGASTRODUODENOSCOPY (EGD) WITH PROPOFOL;  Surgeon: Lucilla Lame, MD;  Location: [facility] ENDOSCOPY;  Service: Endoscopy;  Laterality: N/A;   PELVIC LYMPH NODE DISSECTION Bilateral 12/02/2020   Procedure: PELVIC LYMPH NODE DISSECTION;  Surgeon: [name], MD;  Location: [facility] ORS;  Service: Urology;  Laterality: Bilateral;   ROBOT ASSISTED LAPAROSCOPIC RADICAL PROSTATECTOMY N/A 12/02/2020   Procedure: XI ROBOTIC ASSISTED LAPAROSCOPIC RADICAL PROSTATECTOMY;  Surgeon: [name], MD;  Location: [facility] ORS;  Service: Urology;  Laterality: N/A;   TYMPANOPLASTY Left 04/30/2014   TYMPANOPLASTY  10/2014   VASECTOMY  01/06/2002    Home Medications:  Allergies as of 04/01/2021  Reactions   Lovastatin    Sleep disturbance and extremity pains        Medication List        Accurate as of April 01, 2021  3:01 PM. If you have any questions, ask your nurse or doctor.          aspirin 81 MG tablet Take 81 mg by mouth daily.   ezetimibe 10 MG tablet Commonly known as: ZETIA Take 1 tablet (10 mg total) by mouth daily.   fish  oil-omega-3 fatty acids 1000 MG capsule Take 1 g by mouth daily.   multivitamin with minerals tablet Take 1 tablet by mouth daily.   omeprazole 40 MG capsule Commonly known as: PRILOSEC Take 1 capsule (40 mg total) by mouth daily.   pregabalin 50 MG capsule Commonly known as: LYRICA Take 1 capsule (50 mg total) by mouth 3 (three) times daily.   sildenafil 20 MG tablet Commonly known as: REVATIO Take 3-5 Tablets 1hr prior to intercourse   zolpidem 12.5 MG CR tablet Commonly known as: AMBIEN CR Take 1 tablet (12.5 mg total) by mouth at bedtime as needed.        Allergies:  Allergies  Allergen Reactions   Lovastatin     Sleep disturbance and extremity pains    Family History: Family History  Problem Relation Age of Onset   Heart disease Mother    Asthma Mother    Heart disease Father    CAD Father    Healthy Sister    Healthy Brother    Healthy Daughter    Healthy Daughter    Diabetes Maternal Aunt    Diabetes Maternal Uncle    Colon cancer Neg Hx     Social History:  reports that he has never smoked. He has never used smokeless tobacco. He reports that he does not drink alcohol and does not use drugs.   Physical Exam: BP 135/77    Pulse (!) 54    Ht 6' (1.829 m)    Wt 170 lb (77.1 kg)    BMI 23.06 kg/m   Constitutional:  Alert and oriented, No acute distress. HEENT: [place] AT, moist mucus membranes.  Trachea midline, no masses. Cardiovascular: No clubbing, cyanosis, or edema. Respiratory: Normal respiratory effort, no increased work of breathing. Skin: No rashes, bruises or suspicious lesions. Neurologic: Grossly intact, no focal deficits, moving all 4 extremities. Psychiatric: Normal mood and affect.  Laboratory Data:  Lab Results  Component Value Date   CREATININE 1.28 (H) 12/03/2020   Pertinent imaging:  CLINICAL DATA:  Prostate carcinoma with biochemical recurrence.   EXAM: NUCLEAR MEDICINE PET SKULL BASE TO THIGH   TECHNIQUE: 9.3 mCi F18  Piflufolastat (Pylarify) was injected intravenously. Full-ring PET imaging was performed from the skull base to thigh after the radiotracer. CT data was obtained and used for attenuation correction and anatomic localization.   COMPARISON:  CT 11/18/2020   FINDINGS: NECK   No radiotracer avid nodes.   Incidental CT finding: None   CHEST   No radiotracer accumulation within mediastinal or hilar lymph nodes. No suspicious pulmonary nodules on the CT scan.   Incidental CT finding: None   ABDOMEN/PELVIS   Prostate: No focal activity in the prostate bed.   Lymph nodes: No abnormal radiotracer accumulation within pelvic or abdominal nodes.   Liver: No evidence of liver metastasis   Incidental CT finding: Fluid collection in the deep RIGHT pelvis adjacent to the rectum continues to increase in size. Collection measures  6.5 x 6.0 cm increased from 6.1 x 4.1 cm on CT 11/18/2020. This collection has previously described as as cystic dilatation Seminal vesicles.   SKELETON   No focal  activity to suggest skeletal metastasis.   IMPRESSION: 1. No evidence of local prostate cancer recurrence in the prostatectomy bed. 2. No metastatic adenopathy. 3. No visceral metastasis or skeletal metastasis     Electronically Signed   By: Suzy Bouchard M.D.   On: 02/19/2021 12:22  Assessment & Plan:    Prostate cancer  -Less favorable Intraductal with positive margin at the left base - We discussed PET scan in detail today  - His PSA continues to remain detectable albeit fairly stable -At this point time, he has ongoing fairly significant stress incontinence and stable PSA and thus, prefer to give him more time prior to proceeding with further interventions such as soft radiation until he is recovered more of his continence -Discussed the concept of probable biochemical persistence without any obvious radiographic disease, likely at the prostatic bed - We will continue to track his  PSA  -Patient understands that intraductal has less favorable outcomes tends to be somewhat treatment resistant; currently no known available local clinical trials  2. Stress urinary incontinence  - Recommend he continue pelvic floor exercises, will reach out to physical therapy to see if they think he would benefit from further treatment to try to expedite his recovery  Return in 3 months with PSA  I,Kailey Littlejohn,acting as a scribe for [name], MD.,have documented all relevant documentation on the behalf of [name], MD,as directed by  [name], MD while in the presence of [name], MD.  I have reviewed the above documentation for accuracy and completeness, and I agree with the above.   [name], MD   [facility] [address], [place],  [address] [phone]  I spent 35 total minutes on the day of the encounter including pre-visit review of the medical record, face-to-face time with the patient, and post visit ordering of labs/imaging/tests.  Almost all of this time was spent face-to-face with the family today answering their questions extensively.

## 2021-04-01 ENCOUNTER — Encounter: Payer: Self-pay | Admitting: Urology

## 2021-04-01 ENCOUNTER — Ambulatory Visit: Payer: BC Managed Care – PPO | Admitting: Urology

## 2021-04-01 ENCOUNTER — Other Ambulatory Visit: Payer: Self-pay

## 2021-04-01 VITALS — BP 135/77 | HR 54 | Ht 72.0 in | Wt 170.0 lb

## 2021-04-01 DIAGNOSIS — N393 Stress incontinence (female) (male): Secondary | ICD-10-CM | POA: Diagnosis not present

## 2021-04-01 DIAGNOSIS — C61 Malignant neoplasm of prostate: Secondary | ICD-10-CM

## 2021-04-14 ENCOUNTER — Encounter: Payer: Self-pay | Admitting: Physical Therapy

## 2021-04-14 ENCOUNTER — Ambulatory Visit: Payer: BC Managed Care – PPO | Attending: Urology | Admitting: Physical Therapy

## 2021-04-14 ENCOUNTER — Other Ambulatory Visit: Payer: Self-pay

## 2021-04-14 DIAGNOSIS — R278 Other lack of coordination: Secondary | ICD-10-CM | POA: Insufficient documentation

## 2021-04-14 DIAGNOSIS — R293 Abnormal posture: Secondary | ICD-10-CM | POA: Diagnosis not present

## 2021-04-14 DIAGNOSIS — M6281 Muscle weakness (generalized): Secondary | ICD-10-CM | POA: Diagnosis not present

## 2021-04-14 NOTE — Therapy (Signed)
[facility] [address] [place], [place], [address] Phone: [phone]   Fax:  [phone]  Physical Therapy Treatment  Patient Details  Name: Dale Gray MRN: [id number] Date of Birth: 1956-02-19 Referring Provider (PT): [name]   Encounter Date: 04/14/2021   PT End of Session - 04/14/21 1837     Visit Number 8    Number of Visits 22    Date for PT Re-Evaluation 05/12/21    PT Start Time 1750    PT Stop Time 7106    PT Time Calculation (min) 40 min    Activity Tolerance Patient tolerated treatment well    Behavior During Therapy [facility] for tasks assessed/performed             Past Medical History:  Diagnosis Date   Cancer ([place])    prostate   Complication of anesthesia    hard time waking up   GERD (gastroesophageal reflux disease)    Hemorrhoid    Hyperlipidemia    Joint pain    PONV (postoperative nausea and vomiting)    after 1st ear surgery   Reflux     Past Surgical History:  Procedure Laterality Date   ANAL FISSURE REPAIR  2014   CARPAL TUNNEL RELEASE Right    and elbow surgery   COLONOSCOPY WITH PROPOFOL N/A 03/19/2017   Procedure: COLONOSCOPY WITH PROPOFOL;  Surgeon: Virgel Manifold, MD;  Location: [facility] ENDOSCOPY;  Service: Endoscopy;  Laterality: N/A;   COLONOSCOPY WITH PROPOFOL N/A 08/20/2020   Procedure: COLONOSCOPY WITH PROPOFOL;  Surgeon: Lucilla Lame, MD;  Location: [facility] ENDOSCOPY;  Service: Endoscopy;  Laterality: N/A;   ESOPHAGOGASTRODUODENOSCOPY (EGD) WITH PROPOFOL N/A 08/20/2020   Procedure: ESOPHAGOGASTRODUODENOSCOPY (EGD) WITH PROPOFOL;  Surgeon: Lucilla Lame, MD;  Location: [facility] ENDOSCOPY;  Service: Endoscopy;  Laterality: N/A;   PELVIC LYMPH NODE DISSECTION Bilateral 12/02/2020   Procedure: PELVIC LYMPH NODE DISSECTION;  Surgeon: [name], MD;  Location: [facility] ORS;  Service: Urology;  Laterality: Bilateral;   ROBOT ASSISTED LAPAROSCOPIC RADICAL PROSTATECTOMY N/A  12/02/2020   Procedure: XI ROBOTIC ASSISTED LAPAROSCOPIC RADICAL PROSTATECTOMY;  Surgeon: [name], MD;  Location: [facility] ORS;  Service: Urology;  Laterality: N/A;   TYMPANOPLASTY Left 04/30/2014   TYMPANOPLASTY  10/2014   VASECTOMY  01/06/2002    There were no vitals filed for this visit.   Subjective Assessment - 04/14/21 1833     Subjective Patient reports that he has been working 80-90 hour weeks. 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 Patient conintues to note that his UI is worse with activity; on sedentary days, he uses 3 pads, but adds that the leakage is minimal when he changes the pad. Patient has continued to perform exercises but does note that they are often performed in standing and a bit rushed. He is able to use the muscles to prevent post-micturition UI. At this time, patient is achieving at most 6 hours of sleep/night and is working 14 hour days x6/week.    Pertinent History s/p RP on 9/26; catheter removal on 10/3. Follow-up with urology [name]) on 01/08/21.    Currently in Pain? No/denies            TREATMENT Neuromuscular Re-education: Sidelying diaphragmatic breathing with VCs and TCs for downregulation of the nervous system and improved management of IAP Sidelying, PFM contractions with exhalation.  VCs and TCs to decrease compensatory patterns and encourage activation of the PFM. Patient education on penile clamp for incontinence and use protocol as follows:  Wear all day during the waking hours ONLY. Do not wear when sleeping. Place clamp just before the glans penis (head of penis). Wear all day, minimum of 2 hours between emptying your bladder  Wear 6 days/week, with one day off to re-assess Bladder Chart on the day off On day off see how long you can go without leaking but continue urinating every 2 hours  When  weaning off the clamp wear a pad just in case of leakage. Wear 4-6 weeks. Wean off pads Significant education provided to patient on the impact of stress, sleep deprivation, and strenuous physical activity on bladder and PFM function. Encouraged patient to set boundaries surrounding work for improved management of UI and to encourage optimal healing.    Patient educated throughout session on appropriate technique and form using multi-modal cueing, HEP, and activity modification. Patient articulated understanding and returned demonstration.  Patient Response to interventions: Patient to return in 2 weeks for follow-up.  ASSESSMENT Patient presents to clinic with excellent   motivation to participate in therapy. Patient demonstrates deficits in posture, IAP management, PFM strength, PFM coordination. Patient with significantly improved PFM power and endurance on assessment in clinic today. Patient participated in education on penile clamp for continence and is going to initiate protocol with follow-up in 2 weeks. While patient reports limited progress with PFPT, in the context of his 14 hour workdays performing strenuous physical labor 6 days/week since cleared medically post-op, his progress is substantial. He is largely continent on his day off from work per his report. Patient was educated extensively on the importance of rest as part of his overall recovery from surgery and the benefits rest offers to improving muscle function. Patient will benefit from continued skilled therapeutic intervention to address remaining deficits in posture, IAP management, PFM strength, PFM coordination in order to increase function and improve overall QOL.     PT Long Term Goals - 04/14/21 1838       PT LONG TERM GOAL #1   Title Patient will demonstrate independence with HEP in order to maximize therapeutic gains and improve carryover from physical therapy sessions to ADLs in the home and community.    Baseline IE:  provided; 10/21: IND    Time 6    Period Weeks    Status Achieved      PT LONG TERM GOAL #2   Title Patient will demonstrate independent and coordinated diaphragmatic breathing in supine with a 1:2 breathing pattern for improved down-regulation of the nervous system and improved management of intra-abdominal pressures in order to increase function at home and in the community.    Baseline IE: not demonstrated; 10/21: IND    Time 6    Period Weeks    Status Achieved      PT LONG TERM GOAL #3   Title Patient will demonstrate improved strength and coordination of TrA as evidenced by ability to perform [facility] exercises through phase 5 as well as proper lifting mechanics for object weighing 25 lbs to minimize SUI s/p surgical intervention.    Baseline IE: not demonstrated; 10/21: able to perform through phase 2    Time 12    Period Weeks    Status Achieved      PT LONG TERM GOAL #4   Title Patient will demonstrate circumferential and sequential contraction of >4/5  MMT, > 6 sec hold x10 and 5 consecutive quick flicks with </= 10 min rest between testing bouts, and relaxation of the PFM coordinated with breath for improved management of intra-abdominal pressure and normal bowel and bladder function without the presence of pain nor incontinence in order to improve participation at home and in the community.    Baseline IE: not demonstrated; 10/21: 3/5 MMT, x3 consecutive contractions before quality changed; 2/6: 4/5, x5 quick flicks    Time 12    Period Weeks    Status On-going    Target Date 05/12/21      PT LONG TERM GOAL #5   Title Patient will report protective undergarment usage to be less than 3 pads and 2 briefs per day with activity and will note pad weight to be "light" throughout the day in order to demonstrate improved bladder control for participation at home and in the community.    Baseline 10/21: 3 briefs w/ 7 pads/day; 2/6: 3 pads/day on Sunday with  minimal leakage, working days depend on physical demand    Time 12    Period Weeks    Status On-going    Target Date 05/12/21      PT LONG TERM GOAL #6   Title Patient will report nocturia to be 1-2x/night (commensurate with peer group) and without any UI in order to demonstrate improved bladder control and allow for improved sleep hygiene.    Baseline 10/21: 3-4x/night, "light" UI; 2/6: 1-2x/night    Time 12    Period Weeks    Status On-going    Target Date 05/12/21                   Plan - 04/14/21 1837     Clinical Impression Statement Patient presents to clinic with excellent   motivation to participate in therapy. Patient demonstrates deficits in posture, IAP management, PFM strength, PFM coordination. Patient with significantly improved PFM power and endurance on assessment in clinic today. Patient participated in education on penile clamp for continence and is going to initiate protocol with follow-up in 2 weeks. While patient reports limited progress with PFPT, in the context of his 14 hour workdays performing strenuous physical labor 6 days/week since cleared medically post-op, his progress is substantial. He is largely continent on his day off from work per his report. Patient was educated extensively on the importance of rest as part of his overall recovery from surgery and the benefits rest offers to improving muscle function. Patient will benefit from continued skilled therapeutic intervention to address remaining deficits in posture, IAP management, PFM strength, PFM coordination in order to increase function and improve overall QOL.    Personal Factors and Comorbidities Comorbidity 3+;Age;Behavior Pattern    Comorbidities GERD, prostate cancer, anal fissure, insomnia, anemia, neuropathy    Examination-Activity Limitations Toileting    Examination-Participation Restrictions Occupation    Stability/Clinical Decision Making Evolving/Moderate complexity    Rehab Potential  Good    PT Frequency 1x / week    PT Duration 4 weeks    PT Treatment/Interventions Cryotherapy;Electrical Stimulation;Moist Heat;Therapeutic exercise;Neuromuscular re-education;Patient/family education;Manual techniques;Taping;Joint Manipulations;Spinal Manipulations    PT Next Visit Plan assess progress    PT Home Exercise Plan Access Code: [email or web address]    Consulted and Agree with Plan of Care Patient             Patient will benefit from skilled therapeutic intervention in order to improve the following deficits and impairments:  Postural dysfunction, Improper body  mechanics, Decreased coordination, Decreased strength, Decreased endurance  Visit Diagnosis: Other lack of coordination  Muscle weakness (generalized)  Abnormal posture     Problem List Patient Active Problem List   Diagnosis Date Noted   Prostate cancer ([place]) 12/02/2020   Abnormal findings on diagnostic imaging of digestive system    Gastric polyp    Neck pain 12/29/2017   Cervical radiculitis 09/23/2017   DDD (degenerative disc disease), cervical 09/23/2017   Special screening for malignant neoplasms, colon    Personal history of colonic polyps    Benign neoplasm of ascending colon    Peripheral neuropathy 01/24/2015   Anal fissure 01/22/2015   Disorder of coccyx 01/22/2015   History of digestive disease 01/22/2015   HLD (hyperlipidemia) 01/22/2015   Anemia, iron deficiency 01/22/2015   Yeast dermatitis 06/22/2012   Thrombosed hemorrhoids    Fam hx-ischem heart disease 07/20/2007   Acid reflux 05/26/2006   Cannot sleep 10/12/2005   Hypercholesterolemia without hypertriglyceridemia 10/12/2005    Myles Gip PT, DPT [phone]  04/14/2021, 6:51 PM  [place] [facility] [address] [place], [place], [address] Phone: [phone]   Fax:  [phone]  Name: Dale Gray MRN: [id number] Date of Birth: 1955-10-07

## 2021-04-30 ENCOUNTER — Other Ambulatory Visit: Payer: Self-pay

## 2021-04-30 ENCOUNTER — Ambulatory Visit: Payer: BC Managed Care – PPO | Admitting: Physical Therapy

## 2021-05-05 ENCOUNTER — Encounter: Payer: Self-pay | Admitting: Physical Therapy

## 2021-05-05 ENCOUNTER — Other Ambulatory Visit: Payer: Self-pay

## 2021-05-05 ENCOUNTER — Ambulatory Visit: Payer: BC Managed Care – PPO | Admitting: Physical Therapy

## 2021-05-05 DIAGNOSIS — M6281 Muscle weakness (generalized): Secondary | ICD-10-CM

## 2021-05-05 DIAGNOSIS — R293 Abnormal posture: Secondary | ICD-10-CM

## 2021-05-05 DIAGNOSIS — R278 Other lack of coordination: Secondary | ICD-10-CM

## 2021-05-05 NOTE — Therapy (Signed)
[place] [facility] [address] [place], [place], [address] Phone: [phone]   Fax:  [phone]  Physical Therapy Treatment  Patient Details  Name: Dale Gray MRN: [id number] Date of Birth: [birth year] Referring Provider (PT): [name]   Encounter Date: 05/05/2021   PT End of Session - 05/05/21 1840     Visit Number 9    Number of Visits 22    Date for PT Re-Evaluation 05/12/21    PT Start Time 1800    PT Stop Time 1820    PT Time Calculation (min) 20 min    Activity Tolerance Patient tolerated treatment well    Behavior During Therapy [facility] for tasks assessed/performed             Past Medical History:  Diagnosis Date   Cancer ([place])    prostate   Complication of anesthesia    hard time waking up   GERD (gastroesophageal reflux disease)    Hemorrhoid    Hyperlipidemia    Joint pain    PONV (postoperative nausea and vomiting)    after 1st ear surgery   Reflux     Past Surgical History:  Procedure Laterality Date   ANAL FISSURE REPAIR  2014   CARPAL TUNNEL RELEASE Right    and elbow surgery   COLONOSCOPY WITH PROPOFOL N/A 03/19/2017   Procedure: COLONOSCOPY WITH PROPOFOL;  Surgeon: Virgel Manifold, MD;  Location: [facility] ENDOSCOPY;  Service: Endoscopy;  Laterality: N/A;   COLONOSCOPY WITH PROPOFOL N/A 08/20/2020   Procedure: COLONOSCOPY WITH PROPOFOL;  Surgeon: Lucilla Lame, MD;  Location: [facility] ENDOSCOPY;  Service: Endoscopy;  Laterality: N/A;   ESOPHAGOGASTRODUODENOSCOPY (EGD) WITH PROPOFOL N/A 08/20/2020   Procedure: ESOPHAGOGASTRODUODENOSCOPY (EGD) WITH PROPOFOL;  Surgeon: Lucilla Lame, MD;  Location: [facility] ENDOSCOPY;  Service: Endoscopy;  Laterality: N/A;   PELVIC LYMPH NODE DISSECTION Bilateral 12/02/2020   Procedure: PELVIC LYMPH NODE DISSECTION;  Surgeon: [name], MD;  Location: [facility] ORS;  Service: Urology;  Laterality: Bilateral;   ROBOT ASSISTED LAPAROSCOPIC RADICAL PROSTATECTOMY N/A  12/02/2020   Procedure: XI ROBOTIC ASSISTED LAPAROSCOPIC RADICAL PROSTATECTOMY;  Surgeon: [name], MD;  Location: [facility] ORS;  Service: Urology;  Laterality: N/A;   TYMPANOPLASTY Left 04/30/2014   TYMPANOPLASTY  10/2014   VASECTOMY  01/06/2002    There were no vitals filed for this visit.   Subjective Assessment - 05/05/21 1837     Subjective Patient reports that he has continued working 80-90 hour weeks and is feeling exhausted. He has been using penile continence clamp while at work with excellent results which has offered him some relief from the distress UI had been causing. Yesterday (Sunday), patient notes that he did not use the penile clamp and only used 1 pad over the course fo the day. Even noting light/minimal leakage at the end of the day. Patient has work trip to [facility] in March and knows that he will be walking around a lot but will not be exerting himself as much as his normal work week.    Pertinent History s/p RP on 9/26; catheter removal on 10/3. Follow-up with urology [name]) on 01/08/21.    Currently in Pain? No/denies            TREATMENT Therapeutic Exercise: Patient educated on next steps including continued use of clamp on high physical load days. Patient educated on trial period of limited clamp use while away on business trip as the physical demand will be much less than his  typical. Options for trial include using clamp every other day, using clamp in the afternoon/evening when PFM may be fatigued from morning activity.     Patient educated throughout session on appropriate technique and form using multi-modal cueing, HEP, and activity modification. Patient articulated understanding and returned demonstration.  Patient Response to interventions: Patient to return in 4 weeks for follow-up.  ASSESSMENT Patient presents to clinic with excellent   motivation to participate in therapy. Patient demonstrates deficits in posture, IAP management, PFM strength,  PFM coordination. Patient indicating less reliance on pads on off day (1x/24 hour period) during today's session and expressing satisfaction with current clamp results on working days. Patient educated on next steps and encouraged to trial decreased clamp use on upcoming business trip when physical demand will be active but significantly less than patient's baseline. Patient will benefit from continued skilled therapeutic intervention to address remaining deficits in posture, IAP management, PFM strength, PFM coordination in order to increase function and improve overall QOL.     PT Long Term Goals - 04/14/21 1838       PT LONG TERM GOAL #1   Title Patient will demonstrate independence with HEP in order to maximize therapeutic gains and improve carryover from physical therapy sessions to ADLs in the home and community.    Baseline IE: provided; 10/21: IND    Time 6    Period Weeks    Status Achieved      PT LONG TERM GOAL #2   Title Patient will demonstrate independent and coordinated diaphragmatic breathing in supine with a 1:2 breathing pattern for improved down-regulation of the nervous system and improved management of intra-abdominal pressures in order to increase function at home and in the community.    Baseline IE: not demonstrated; 10/21: IND    Time 6    Period Weeks    Status Achieved      PT LONG TERM GOAL #3   Title Patient will demonstrate improved strength and coordination of TrA as evidenced by ability to perform [facility] exercises through phase 5 as well as proper lifting mechanics for object weighing 25 lbs to minimize SUI s/p surgical intervention.    Baseline IE: not demonstrated; 10/21: able to perform through phase 2    Time 12    Period Weeks    Status Achieved      PT LONG TERM GOAL #4   Title Patient will demonstrate circumferential and sequential contraction of >4/5 MMT, > 6 sec hold x10 and 5 consecutive quick flicks with </= 10 min rest  between testing bouts, and relaxation of the PFM coordinated with breath for improved management of intra-abdominal pressure and normal bowel and bladder function without the presence of pain nor incontinence in order to improve participation at home and in the community.    Baseline IE: not demonstrated; 10/21: 3/5 MMT, x3 consecutive contractions before quality changed; 2/6: 4/5, x5 quick flicks    Time 12    Period Weeks    Status On-going    Target Date 05/12/21      PT LONG TERM GOAL #5   Title Patient will report protective undergarment usage to be less than 3 pads and 2 briefs per day with activity and will note pad weight to be "light" throughout the day in order to demonstrate improved bladder control for participation at home and in the community.    Baseline 10/21: 3 briefs w/ 7 pads/day; 2/6: 3 pads/day on Sunday with minimal leakage,  working days depend on physical demand    Time 12    Period Weeks    Status On-going    Target Date 05/12/21      PT LONG TERM GOAL #6   Title Patient will report nocturia to be 1-2x/night (commensurate with peer group) and without any UI in order to demonstrate improved bladder control and allow for improved sleep hygiene.    Baseline 10/21: 3-4x/night, "light" UI; 2/6: 1-2x/night    Time 12    Period Weeks    Status On-going    Target Date 05/12/21                   Plan - 05/05/21 1841     Clinical Impression Statement Patient presents to clinic with excellent   motivation to participate in therapy. Patient demonstrates deficits in posture, IAP management, PFM strength, PFM coordination. Patient indicating less reliance on pads on off day (1x/24 hour period) during today's session and expressing satisfaction with current clamp results on working days. Patient educated on next steps and encouraged to trial decreased clamp use on upcoming business trip when physical demand will be active but significantly less than patient's baseline.  Patient will benefit from continued skilled therapeutic intervention to address remaining deficits in posture, IAP management, PFM strength, PFM coordination in order to increase function and improve overall QOL.    Personal Factors and Comorbidities Comorbidity 3+;Age;Behavior Pattern    Comorbidities GERD, prostate cancer, anal fissure, insomnia, anemia, neuropathy    Examination-Activity Limitations Toileting    Examination-Participation Restrictions Occupation    Stability/Clinical Decision Making Evolving/Moderate complexity    Rehab Potential Good    PT Frequency 1x / week    PT Duration 4 weeks    PT Treatment/Interventions Cryotherapy;Electrical Stimulation;Moist Heat;Therapeutic exercise;Neuromuscular re-education;Patient/family education;Manual techniques;Taping;Joint Manipulations;Spinal Manipulations    PT Next Visit Plan clamp weaning protocol; reassess    PT Home Exercise Plan Access Code: [email or web address]    Consulted and Agree with Plan of Care Patient             Patient will benefit from skilled therapeutic intervention in order to improve the following deficits and impairments:  Postural dysfunction, Improper body mechanics, Decreased coordination, Decreased strength, Decreased endurance  Visit Diagnosis: Other lack of coordination  Muscle weakness (generalized)  Abnormal posture     Problem List Patient Active Problem List   Diagnosis Date Noted   Prostate cancer ([place]) 12/02/2020   Abnormal findings on diagnostic imaging of digestive system    Gastric polyp    Neck pain 12/29/2017   Cervical radiculitis 09/23/2017   DDD (degenerative disc disease), cervical 09/23/2017   Special screening for malignant neoplasms, colon    Personal history of colonic polyps    Benign neoplasm of ascending colon    Peripheral neuropathy 01/24/2015   Anal fissure 01/22/2015   Disorder of coccyx 01/22/2015   History of digestive disease 01/22/2015   HLD  (hyperlipidemia) 01/22/2015   Anemia, iron deficiency 01/22/2015   Yeast dermatitis 06/22/2012   Thrombosed hemorrhoids    Fam hx-ischem heart disease 07/20/2007   Acid reflux 05/26/2006   Cannot sleep 10/12/2005   Hypercholesterolemia without hypertriglyceridemia 10/12/2005    Myles Gip PT, DPT [phone]  05/05/2021, 6:51 PM   [facility] [address] [facility] [address] [place], [place], [address] Phone: [phone]   Fax:  [phone]  Name: Dale Gray MRN: [id number] Date of Birth: 1955/06/06

## 2021-05-07 ENCOUNTER — Other Ambulatory Visit: Payer: Self-pay

## 2021-05-07 DIAGNOSIS — C61 Malignant neoplasm of prostate: Secondary | ICD-10-CM

## 2021-05-12 ENCOUNTER — Other Ambulatory Visit: Payer: BC Managed Care – PPO

## 2021-05-12 ENCOUNTER — Other Ambulatory Visit: Payer: Self-pay

## 2021-05-12 DIAGNOSIS — C61 Malignant neoplasm of prostate: Secondary | ICD-10-CM

## 2021-05-13 LAB — PSA: Prostate Specific Ag, Serum: 2.9 ng/mL (ref 0.0–4.0)

## 2021-05-14 ENCOUNTER — Other Ambulatory Visit: Payer: Self-pay

## 2021-05-14 ENCOUNTER — Ambulatory Visit (INDEPENDENT_AMBULATORY_CARE_PROVIDER_SITE_OTHER): Payer: BC Managed Care – PPO | Admitting: Physician Assistant

## 2021-05-14 ENCOUNTER — Encounter: Payer: Self-pay | Admitting: Physician Assistant

## 2021-05-14 VITALS — BP 135/78 | HR 54 | Ht 70.0 in | Wt 169.1 lb

## 2021-05-14 DIAGNOSIS — Z8546 Personal history of malignant neoplasm of prostate: Secondary | ICD-10-CM | POA: Insufficient documentation

## 2021-05-14 DIAGNOSIS — R202 Paresthesia of skin: Secondary | ICD-10-CM | POA: Diagnosis not present

## 2021-05-14 DIAGNOSIS — E7800 Pure hypercholesterolemia, unspecified: Secondary | ICD-10-CM

## 2021-05-14 DIAGNOSIS — J309 Allergic rhinitis, unspecified: Secondary | ICD-10-CM | POA: Diagnosis not present

## 2021-05-14 DIAGNOSIS — G4700 Insomnia, unspecified: Secondary | ICD-10-CM

## 2021-05-14 DIAGNOSIS — Z0000 Encounter for general adult medical examination without abnormal findings: Secondary | ICD-10-CM

## 2021-05-14 DIAGNOSIS — F5101 Primary insomnia: Secondary | ICD-10-CM

## 2021-05-14 DIAGNOSIS — G629 Polyneuropathy, unspecified: Secondary | ICD-10-CM

## 2021-05-14 MED ORDER — LORATADINE 10 MG PO TABS: 10.0000 mg | ORAL_TABLET | Freq: Every day | ORAL | 1 refills | Status: DC

## 2021-05-14 MED ORDER — AZELASTINE HCL 0.1 % NA SOLN: 1.0000 | Freq: Two times a day (BID) | NASAL | 6 refills | Status: DC

## 2021-05-14 MED ORDER — ZOLPIDEM TARTRATE ER 12.5 MG PO TBCR: 12.5000 mg | EXTENDED_RELEASE_TABLET | Freq: Every evening | ORAL | 0 refills | Status: DC | PRN

## 2021-05-14 NOTE — Progress Notes (Signed)
? ?05/19/21 ?9:05 AM  ? ?Dale Gray ?1955-11-14 ?[id number] ? ?Referring provider:  ?No referring provider defined for this encounter. ?Chief Complaint  ?Patient presents with  ? Prostate Cancer  ? ? ?HPI: ?Dale Gray is a 66 y.o.male  with a personal history of unfavorable intermediate risk prostate cancer, who presents today fo a 6 month follow-up with PSA.  ? ?He is now s/p DaVinci laparoscopic radical prostatectomy and bilateral pelvic lymph node dissection on 12/02/2020. Periprostatic fat pad, pelvic lymph node, right,  pelvic lymph node, left, and prostate were sent for pathology. Surgical pathology revealed negative for malignancy aside for prostate that revealed invasive prostatic ductal adenocarcinoma. Negative LN, BN, ECE however + margin at left base which was intraoperative technically difficult in the setting of massive vas dilation,proximity to rectum as well as absence of posterior plane. ? ?PET scan on 02/18/2021 visualized no evidence of local prostate cancer recurrence in the prostatectomy bed. No metastatic adenopathy. No visceral metastasis or skeletal metastasis ? ?His most recent PSA was 2.9 on 05/12/2021.  ? ?Post-operative imaging showed a suspicious right lateral wall renal mass.  ? ?He has been seen by physical therapy twice since the last visit.   ? ?He has been using a penile clamp on work days that has improved his symptoms.  ? ?He is accompanied by his wife today. He reports that physical therapy has not improved his incontinence. He reports that when he is standing he experiences increased leakage. He reports that sildenafil has not helped with his erections.  ? ?PSA trend:  ?Component Prostate Specific Ag, Serum  ?Latest Ref Rng & Units 0.0 - 4.0 ng/mL  ?03/10/2018 6.5 (H)  ?09/12/2018 7.3 (H)  ?10/25/2018 7.3 (H)  ?04/04/2019 9.1 (H)  ?07/31/2019 9.2 (H)  ?12/06/2019 5.0 (H)  ?05/10/2020 6.1 (H)  ?06/11/2020 9.3 (H)  ?12/30/2020 2.6  ?01/24/2021 2.2  ?03/13/2021 2.5  ?05/12/2021 2.9   ? ? ?PMH: ?Past Medical History:  ?Diagnosis Date  ? Cancer [facility])   ? prostate  ? Complication of anesthesia   ? hard time waking up  ? GERD (gastroesophageal reflux disease)   ? Hemorrhoid   ? Hyperlipidemia   ? Joint pain   ? PONV (postoperative nausea and vomiting)   ? after 1st ear surgery  ? Reflux   ? ? ?Surgical History: ?Past Surgical History:  ?Procedure Laterality Date  ? ANAL FISSURE REPAIR  2014  ? CARPAL TUNNEL RELEASE Right   ? and elbow surgery  ? COLONOSCOPY WITH PROPOFOL N/A 03/19/2017  ? Procedure: COLONOSCOPY WITH PROPOFOL;  Surgeon: Virgel Manifold, MD;  Location: [facility] ENDOSCOPY;  Service: Endoscopy;  Laterality: N/A;  ? COLONOSCOPY WITH PROPOFOL N/A 08/20/2020  ? Procedure: COLONOSCOPY WITH PROPOFOL;  Surgeon: Lucilla Lame, MD;  Location: [facility] ENDOSCOPY;  Service: Endoscopy;  Laterality: N/A;  ? ESOPHAGOGASTRODUODENOSCOPY (EGD) WITH PROPOFOL N/A 08/20/2020  ? Procedure: ESOPHAGOGASTRODUODENOSCOPY (EGD) WITH PROPOFOL;  Surgeon: Lucilla Lame, MD;  Location: [facility] ENDOSCOPY;  Service: Endoscopy;  Laterality: N/A;  ? PELVIC LYMPH NODE DISSECTION Bilateral 12/02/2020  ? Procedure: PELVIC LYMPH NODE DISSECTION;  Surgeon: [name], MD;  Location: [facility] ORS;  Service: Urology;  Laterality: Bilateral;  ? ROBOT ASSISTED LAPAROSCOPIC RADICAL PROSTATECTOMY N/A 12/02/2020  ? Procedure: XI ROBOTIC ASSISTED LAPAROSCOPIC RADICAL PROSTATECTOMY;  Surgeon: [name], MD;  Location: [facility] ORS;  Service: Urology;  Laterality: N/A;  ? TYMPANOPLASTY Left 04/30/2014  ? TYMPANOPLASTY  10/2014  ? VASECTOMY  01/06/2002  ? ? ?Home Medications:  ?  Allergies as of 05/15/2021   ? ?   Reactions  ? Lovastatin   ? Sleep disturbance and extremity pains  ? ?  ? ?  ?Medication List  ?  ? ?  ? Accurate as of May 15, 2021 11:59 PM. If you have any questions, ask your nurse or doctor.  ?  ?  ? ?  ? ?aspirin 81 MG tablet ?Take 81 mg by mouth daily. ?  ?azelastine 0.1 % nasal spray ?Commonly known as: ASTELIN ?Place 1 spray  into both nostrils 2 (two) times daily. Use in each nostril as directed ?  ?ezetimibe 10 MG tablet ?Commonly known as: ZETIA ?Take 1 tablet (10 mg total) by mouth daily. ?  ?fish oil-omega-3 fatty acids 1000 MG capsule ?Take 1 g by mouth daily. ?  ?loratadine 10 MG tablet ?Commonly known as: CLARITIN ?Take 1 tablet (10 mg total) by mouth daily. ?  ?multivitamin with minerals tablet ?Take 1 tablet by mouth daily. ?  ?omeprazole 40 MG capsule ?Commonly known as: PRILOSEC ?Take 1 capsule (40 mg total) by mouth daily. ?  ?Orgovyx 120 MG Tabs ?Generic drug: Relugolix ?Take 120 mg by mouth daily. ?Started by: [name], MD ?  ?pregabalin 50 MG capsule ?Commonly known as: LYRICA ?Take 1 capsule (50 mg total) by mouth 3 (three) times daily. ?  ?tadalafil 5 MG tablet ?Commonly known as: CIALIS ?Take 1 tablet (5 mg total) by mouth daily as needed for erectile dysfunction. ?Started by: [name], MD ?  ?zolpidem 12.5 MG CR tablet ?Commonly known as: AMBIEN CR ?Take 1 tablet (12.5 mg total) by mouth at bedtime as needed. ?  ? ?  ? ? ?Allergies:  ?Allergies  ?Allergen Reactions  ? Lovastatin   ?  Sleep disturbance and extremity pains  ? ? ?Family History: ?Family History  ?Problem Relation Age of Onset  ? Heart disease Mother   ? Asthma Mother   ? Heart disease Father   ? CAD Father   ? Healthy Sister   ? Healthy Brother   ? Healthy Daughter   ? Healthy Daughter   ? Diabetes Maternal Aunt   ? Diabetes Maternal Uncle   ? Colon cancer Neg Hx   ? ? ?Social History:  reports that he has never smoked. He has never used smokeless tobacco. He reports that he does not drink alcohol and does not use drugs. ? ? ?Physical Exam: ?BP (!) 142/82   Pulse 60   Ht '5\' 10"'$  (1.778 m)   Wt 165 lb (74.8 kg)   BMI 23.68 kg/m?   ?Constitutional:  Alert and oriented, No acute distress. ?HEENT: [place] AT, moist mucus membranes.  Trachea midline, no masses. ?Cardiovascular: No clubbing, cyanosis, or edema. ?Respiratory: Normal respiratory  effort, no increased work of breathing. ?Skin: No rashes, bruises or suspicious lesions. ?Neurologic: Grossly intact, no focal deficits, moving all 4 extremities. ?Psychiatric: Normal mood and affect. ? ?Laboratory Data: ? ?Lab Results  ?Component Value Date  ? CREATININE 1.16 05/14/2021  ? ? ?Assessment & Plan:   ?Prostate cancer  ?-Less favorable Intraductal with positive margin at the left base ?- PSA continues to remain detectable and rising slowly ?- Will have him repeat PSMA PET scan ?- Discussed radiation with addition of ADT . We also discussed orgovyx an oral agent, he is interested in trying orgovyx. ?- Orgovyx prescribed, given 2 months samples ?- Will follow-up with PSA in 2 months   ?- Referral sent to medical radiation oncology for  an opinion ? ?2. Stress urinary incontinence  ?- He has not had improvement with physical therapy. Recommend he continue pelvic floor exercises ?- Continue using penile clamp  ?-discussed options for down the road if he continues to have significant incontinence including AUS although would wait at least 1 year post op ? ?3. Erectile dysfunction after prostatectomy ?- He has not had improvement on sildenafil  ?- Discussed the importance of blood flow to the penis to aid in recovery  ?- He is interested in a different medication today. Prescribed Cialis today.  ? ?Return in 2 months with PSA  ? ?Conley Rolls as a Education administrator for [name], MD.,have documented all relevant documentation on the behalf of [name], MD,as directed by  [name], MD while in the presence of [name], MD. ? ?I have reviewed the above documentation for accuracy and completeness, and I agree with the above.  ? ?[name], MD ? ? ?[facility] ?[address] ?[place], [place] [address] ?[phone] ?

## 2021-05-14 NOTE — Assessment & Plan Note (Signed)
Currently controlled w/ lyrica BID. Will change rx next time to reflect BID use. ?Based on description of symptoms, may be more consistent w/ restless leg syndrome. ?Continue lyrica, if ever uncontrolled in future, can consider further w/u.  ?Will check vit b12 with labs ?

## 2021-05-14 NOTE — Assessment & Plan Note (Addendum)
S/p radical prostatectomy 9/22, positive margin, PET scan 12/22 negative.  ?Chronic incontinence issues post-op ?Followed by uro with regular psa checks to eval for recurrence. ?

## 2021-05-14 NOTE — Assessment & Plan Note (Signed)
Pt has been taking ambien nightly for 15-20 years.  ?Inheriting pt on chronic sleep med. Pt does want to stop taking, but has struggled in the past when trying to self d/c. ?Discussed first step would be to cut down from 12.5 mg nightly to 6.25 mg nightly.  ?Will discussed again in future, no interest currently in changing med dose. ?

## 2021-05-14 NOTE — Assessment & Plan Note (Signed)
rx azelastine nasal spray, loratadine 10 mg.  ?No signs of bacterial sinusitis ?

## 2021-05-14 NOTE — Progress Notes (Signed)
Complete physical exam   Patient: Dale Gray   DOB: 1955-07-21   66 y.o. Male  MRN: [id number] Visit Date: 05/14/2021  Today's healthcare provider: [name], PA-C   Cc. cpe  Subjective    Dale Gray is a 66 y.o. male who presents today for a complete physical exam.  He reports consuming a general diet. The patient has a physically strenuous job, but has no regular exercise apart from work.  He generally feels fairly well. He reports sleeping well. He does not have additional problems to discuss today.  HPI   Reports that for the last 6-7 weeks, nasal congestion and sinus pressure. Some PND. Residual from a cold that went into his chest. Denies headaches, fevers, cough, chest pain.  Past Medical History:  Diagnosis Date   Cancer ([place])    prostate   Complication of anesthesia    hard time waking up   GERD (gastroesophageal reflux disease)    Hemorrhoid    Hyperlipidemia    Joint pain    PONV (postoperative nausea and vomiting)    after 1st ear surgery   Reflux    Past Surgical History:  Procedure Laterality Date   ANAL FISSURE REPAIR  2014   CARPAL TUNNEL RELEASE Right    and elbow surgery   COLONOSCOPY WITH PROPOFOL N/A 03/19/2017   Procedure: COLONOSCOPY WITH PROPOFOL;  Surgeon: Virgel Manifold, MD;  Location: [facility] ENDOSCOPY;  Service: Endoscopy;  Laterality: N/A;   COLONOSCOPY WITH PROPOFOL N/A 08/20/2020   Procedure: COLONOSCOPY WITH PROPOFOL;  Surgeon: Lucilla Lame, MD;  Location: [facility] ENDOSCOPY;  Service: Endoscopy;  Laterality: N/A;   ESOPHAGOGASTRODUODENOSCOPY (EGD) WITH PROPOFOL N/A 08/20/2020   Procedure: ESOPHAGOGASTRODUODENOSCOPY (EGD) WITH PROPOFOL;  Surgeon: Lucilla Lame, MD;  Location: [facility] ENDOSCOPY;  Service: Endoscopy;  Laterality: N/A;   PELVIC LYMPH NODE DISSECTION Bilateral 12/02/2020   Procedure: PELVIC LYMPH NODE DISSECTION;  Surgeon: [name], MD;  Location: [facility] ORS;  Service: Urology;  Laterality: Bilateral;    ROBOT ASSISTED LAPAROSCOPIC RADICAL PROSTATECTOMY N/A 12/02/2020   Procedure: XI ROBOTIC ASSISTED LAPAROSCOPIC RADICAL PROSTATECTOMY;  Surgeon: [name], MD;  Location: [facility] ORS;  Service: Urology;  Laterality: N/A;   TYMPANOPLASTY Left 04/30/2014   TYMPANOPLASTY  10/2014   VASECTOMY  01/06/2002   Social History   Socioeconomic History   Marital status: Married    Spouse name: Not on file   Number of children: Not on file   Years of education: Not on file   Highest education level: Not on file  Occupational History   Not on file  Tobacco Use   Smoking status: Never   Smokeless tobacco: Never  Vaping Use   Vaping Use: Never used  Substance and Sexual Activity   Alcohol use: No   Drug use: No   Sexual activity: Not on file  Other Topics Concern   Not on file  Social History Narrative   Not on file   Social Determinants of Health   Financial Resource Strain: Not on file  Food Insecurity: Not on file  Transportation Needs: Not on file  Physical Activity: Not on file  Stress: Not on file  Social Connections: Not on file  Intimate Partner Violence: Not on file   Family Status  Relation Name Status   Mother  Alive   Father  Alive   Sister  Alive   Brother  Alive   Daughter  Alive   Daughter  Ecologist  (  Not Specified)   Mat Uncle  (Not Specified)   Neg Hx  (Not Specified)   Family History  Problem Relation Age of Onset   Heart disease Mother    Asthma Mother    Heart disease Father    CAD Father    Healthy Sister    Healthy Brother    Healthy Daughter    Healthy Daughter    Diabetes Maternal Aunt    Diabetes Maternal Uncle    Colon cancer Neg Hx    Allergies  Allergen Reactions   Lovastatin     Sleep disturbance and extremity pains    Patient Care Team: Jerrol Banana., MD as PCP - General (Family Medicine) Bary Castilla, Forest Gleason, MD as Consulting Physician (General Surgery) [name], MD as Consulting Physician (Urology)    Medications: Outpatient Medications Prior to Visit  Medication Sig   aspirin 81 MG tablet Take 81 mg by mouth daily.   ezetimibe (ZETIA) 10 MG tablet Take 1 tablet (10 mg total) by mouth daily.   fish oil-omega-3 fatty acids 1000 MG capsule Take 1 g by mouth daily.   Multiple Vitamins-Minerals (MULTIVITAMIN WITH MINERALS) tablet Take 1 tablet by mouth daily.   omeprazole (PRILOSEC) 40 MG capsule Take 1 capsule (40 mg total) by mouth daily.   pregabalin (LYRICA) 50 MG capsule Take 1 capsule (50 mg total) by mouth 3 (three) times daily.   [DISCONTINUED] zolpidem (AMBIEN CR) 12.5 MG CR tablet Take 1 tablet (12.5 mg total) by mouth at bedtime as needed.   [DISCONTINUED] sildenafil (REVATIO) 20 MG tablet Take 3-5 Tablets 1hr prior to intercourse (Patient not taking: Reported on 05/14/2021)   No facility-administered medications prior to visit.    Review of Systems  HENT:  Positive for congestion, sinus pressure and voice change.   Respiratory:  Positive for cough.   Genitourinary:  Positive for frequency.  All other systems reviewed and are negative.   Objective    Blood pressure 135/78, pulse (!) 54, height '5\' 10"'$  (1.778 m), weight 169 lb 1.6 oz (76.7 kg), SpO2 100 %.    Physical Exam Constitutional:      General: He is awake.     Appearance: He is well-developed.  HENT:     Head: Normocephalic.     Right Ear: Tympanic membrane, ear canal and external ear normal.     Left Ear: Tympanic membrane, ear canal and external ear normal.     Nose: Nose normal. No congestion or rhinorrhea.     Mouth/Throat:     Mouth: Mucous membranes are moist.     Pharynx: No oropharyngeal exudate or posterior oropharyngeal erythema.  Eyes:     Pupils: Pupils are equal, round, and reactive to light.  Cardiovascular:     Rate and Rhythm: Normal rate and regular rhythm.     Heart sounds: Normal heart sounds.  Pulmonary:     Effort: Pulmonary effort is normal.     Breath sounds: Normal breath sounds.   Abdominal:     General: There is no distension.     Palpations: Abdomen is soft.     Tenderness: There is no abdominal tenderness. There is no guarding.  Musculoskeletal:     Cervical back: Normal range of motion.     Right lower leg: No edema.     Left lower leg: No edema.  Lymphadenopathy:     Cervical: No cervical adenopathy.  Skin:    General: Skin is warm.  Neurological:  Mental Status: He is alert and oriented to person, place, and time.  Psychiatric:        Attention and Perception: Attention normal.        Mood and Affect: Mood normal.        Speech: Speech normal.        Behavior: Behavior normal. Behavior is cooperative.     Last depression screening scores PHQ 2/9 Scores 05/14/2021 05/10/2020 05/05/2019  PHQ - 2 Score 0 0 0  PHQ- 9 Score '2 3 1   '$ Last fall risk screening Fall Risk  05/14/2021  Falls in the past year? 0  Number falls in past yr: 0  Comment -  Injury with Fall? 0  Risk for fall due to : No Fall Risks   Last Audit-C alcohol use screening Alcohol Use Disorder Test (AUDIT) 05/14/2021  1. How often do you have a drink containing alcohol? 0  2. How many drinks containing alcohol do you have on a typical day when you are drinking? 0  3. How often do you have six or more drinks on one occasion? 0  AUDIT-C Score 0  Alcohol Brief Interventions/Follow-up -   A score of 3 or more in women, and 4 or more in men indicates increased risk for alcohol abuse, EXCEPT if all of the points are from question 1   No results found for any visits on 05/14/21.  Assessment & Plan    Routine Health Maintenance and Physical Exam  Exercise Activities and Dietary recommendations --regular exercise, 30 min 3-5 times a week --balanced diet high in fibers, protein and low in fats, sugars, carbs   Immunization History  Administered Date(s) Administered   Influenza,inj,Quad PF,6+ Mos 12/13/2015, 11/26/2016, 01/25/2018, 01/03/2019, 01/04/2020, 12/03/2020   PFIZER(Purple  Top)SARS-COV-2 Vaccination 11/07/2019, 12/05/2019   Td 02/18/2017   Tdap 05/26/2006   Zoster Recombinat (Shingrix) 03/10/2018, 05/09/2018    Health Maintenance  Topic Date Due   COVID-19 Vaccine (3 - Pfizer risk series) 01/02/2020   COLONOSCOPY (Pts 45-35yr Insurance coverage will need to be confirmed)  08/20/2025   TETANUS/TDAP  02/19/2027   INFLUENZA VACCINE  Completed   Hepatitis C Screening  Completed   HIV Screening  Completed   Zoster Vaccines- Shingrix  Completed   HPV VACCINES  Aged Out    Discussed health benefits of physical activity, and encouraged him to engage in regular exercise appropriate for his age and condition.  Problem List Items Addressed This Visit       Respiratory   Allergic rhinitis    rx azelastine nasal spray, loratadine 10 mg.  No signs of bacterial sinusitis      Relevant Medications   azelastine (ASTELIN) 0.1 % nasal spray   loratadine (CLARITIN) 10 MG tablet     Nervous and Auditory   Peripheral neuropathy    Currently controlled w/ lyrica BID. Will change rx next time to reflect BID use. Based on description of symptoms, may be more consistent w/ restless leg syndrome. Continue lyrica, if ever uncontrolled in future, can consider further w/u.  Will check vit b12 with labs      Relevant Medications   zolpidem (AMBIEN CR) 12.5 MG CR tablet   Other Relevant Orders   Vitamin B12     Other   Insomnia    Pt has been taking ambien nightly for 15-20 years.  Inheriting pt on chronic sleep med. Pt does want to stop taking, but has struggled in the past when trying to self  d/c. Discussed first step would be to cut down from 12.5 mg nightly to 6.25 mg nightly.  Will discussed again in future, no interest currently in changing med dose.      Relevant Medications   zolpidem (AMBIEN CR) 12.5 MG CR tablet   Hypercholesterolemia without hypertriglyceridemia    Will recheck lipid panel. The 10-year ASCVD risk score (Arnett DK, et al., 2019)  is: 13.7%  Previous statin intolerance, taking zetia      Relevant Orders   Lipid panel   History of prostate cancer    S/p radical prostatectomy 9/22, positive margin, PET scan 12/22 negative.  Chronic incontinence issues post-op Followed by uro with regular psa checks to eval for recurrence.      Other Visit Diagnoses     Encounter for health maintenance examination    -  Primary   Relevant Orders   Comprehensive Metabolic Panel (CMET)   CBC w/Diff/Platelet   Lipid panel   HgB A1c   TSH + free T4        Return in about 1 year (around 05/15/2022) for CPE.     I, [name], PA-C have reviewed all documentation for this visit. The documentation on  05/14/2021 for the exam, diagnosis, procedures, and orders are all accurate and complete.    [name], PA-C [facility] [address] [place], [place], [address] Office: [phone] Fax: [place]

## 2021-05-14 NOTE — Assessment & Plan Note (Addendum)
Will recheck lipid panel. ?The 10-year ASCVD risk score (Arnett DK, et al., 2019) is: 13.7%  ?Previous statin intolerance, taking zetia ?

## 2021-05-15 ENCOUNTER — Ambulatory Visit: Payer: BC Managed Care – PPO | Admitting: Urology

## 2021-05-15 ENCOUNTER — Other Ambulatory Visit: Payer: Self-pay

## 2021-05-15 ENCOUNTER — Encounter: Payer: Self-pay | Admitting: Urology

## 2021-05-15 VITALS — BP 142/82 | HR 60 | Ht 70.0 in | Wt 165.0 lb

## 2021-05-15 DIAGNOSIS — C61 Malignant neoplasm of prostate: Secondary | ICD-10-CM | POA: Diagnosis not present

## 2021-05-15 LAB — COMPREHENSIVE METABOLIC PANEL
ALT: 31 IU/L (ref 0–44)
AST: 29 IU/L (ref 0–40)
Albumin/Globulin Ratio: 2.2 (ref 1.2–2.2)
Albumin: 4.7 g/dL (ref 3.8–4.8)
Alkaline Phosphatase: 73 IU/L (ref 44–121)
BUN/Creatinine Ratio: 12 (ref 10–24)
BUN: 14 mg/dL (ref 8–27)
Bilirubin Total: 0.5 mg/dL (ref 0.0–1.2)
CO2: 26 mmol/L (ref 20–29)
Calcium: 9.6 mg/dL (ref 8.6–10.2)
Chloride: 103 mmol/L (ref 96–106)
Creatinine, Ser: 1.16 mg/dL (ref 0.76–1.27)
Globulin, Total: 2.1 g/dL (ref 1.5–4.5)
Glucose: 92 mg/dL (ref 70–99)
Potassium: 5.3 mmol/L — ABNORMAL HIGH (ref 3.5–5.2)
Sodium: 143 mmol/L (ref 134–144)
Total Protein: 6.8 g/dL (ref 6.0–8.5)
eGFR: 71 mL/min/{1.73_m2} (ref >59)

## 2021-05-15 LAB — CBC WITH DIFFERENTIAL/PLATELET
Basophils Absolute: 0.1 10*3/uL (ref 0.0–0.2)
Basos: 1 %
EOS (ABSOLUTE): 0.3 10*3/uL (ref 0.0–0.4)
Eos: 4 %
Hematocrit: 41.6 % (ref 37.5–51.0)
Hemoglobin: 13.5 g/dL (ref 13.0–17.7)
Immature Grans (Abs): 0.1 10*3/uL (ref 0.0–0.1)
Immature Granulocytes: 1 %
Lymphocytes Absolute: 1.5 10*3/uL (ref 0.7–3.1)
Lymphs: 23 %
MCH: 29.3 pg (ref 26.6–33.0)
MCHC: 32.5 g/dL (ref 31.5–35.7)
MCV: 90 fL (ref 79–97)
Monocytes Absolute: 0.6 10*3/uL (ref 0.1–0.9)
Monocytes: 10 %
Neutrophils Absolute: 4 10*3/uL (ref 1.4–7.0)
Neutrophils: 61 %
Platelets: 319 10*3/uL (ref 150–450)
RBC: 4.6 x10E6/uL (ref 4.14–5.80)
RDW: 13.3 % (ref 11.6–15.4)
WBC: 6.4 10*3/uL (ref 3.4–10.8)

## 2021-05-15 LAB — LIPID PANEL
Chol/HDL Ratio: 4.2 ratio (ref 0.0–5.0)
Cholesterol, Total: 206 mg/dL — ABNORMAL HIGH (ref 100–199)
HDL: 49 mg/dL (ref >39)
LDL Chol Calc (NIH): 140 mg/dL — ABNORMAL HIGH (ref 0–99)
Triglycerides: 97 mg/dL (ref 0–149)
VLDL Cholesterol Cal: 17 mg/dL (ref 5–40)

## 2021-05-15 LAB — TSH+FREE T4
Free T4: 1.14 ng/dL (ref 0.82–1.77)
TSH: 1.81 u[IU]/mL (ref 0.450–4.500)

## 2021-05-15 LAB — VITAMIN B12: Vitamin B-12: 1349 pg/mL — ABNORMAL HIGH (ref 232–1245)

## 2021-05-15 LAB — HEMOGLOBIN A1C
Est. average glucose Bld gHb Est-mCnc: 114 mg/dL
Hgb A1c MFr Bld: 5.6 % (ref 4.8–5.6)

## 2021-05-15 MED ORDER — TADALAFIL 5 MG PO TABS: 5.0000 mg | ORAL_TABLET | Freq: Every day | ORAL | 11 refills | Status: DC | PRN

## 2021-05-15 MED ORDER — ORGOVYX 120 MG PO TABS: 120.0000 mg | ORAL_TABLET | Freq: Every day | ORAL | 0 refills | Status: DC

## 2021-05-15 NOTE — Patient Instructions (Signed)
Take 3 tablets on the first day and 1 pill daily after. ?

## 2021-05-16 ENCOUNTER — Encounter: Payer: BC Managed Care – PPO | Admitting: Family Medicine

## 2021-05-22 ENCOUNTER — Institutional Professional Consult (permissible substitution): Payer: BC Managed Care – PPO | Admitting: Radiation Oncology

## 2021-05-23 ENCOUNTER — Encounter: Payer: Self-pay | Admitting: *Deleted

## 2021-05-26 ENCOUNTER — Other Ambulatory Visit: Payer: Self-pay

## 2021-05-26 ENCOUNTER — Ambulatory Visit
Admission: RE | Admit: 2021-05-26 | Discharge: 2021-05-26 | Disposition: A | Discharge status: Home / Self Care | Payer: BC Managed Care – PPO | Source: Ambulatory Visit | Attending: Radiation Oncology | Admitting: Radiation Oncology

## 2021-05-26 VITALS — BP 140/80 | HR 57 | Temp 96.5°F | Resp 18 | Wt 170.1 lb

## 2021-05-26 DIAGNOSIS — C61 Malignant neoplasm of prostate: Secondary | ICD-10-CM | POA: Diagnosis not present

## 2021-05-26 NOTE — Consult Note (Signed)
?NEW PATIENT EVALUATION ? ?Name: Dale Gray  ?MRN: [id number]  ?Date:   05/26/2021     ?DOB: 1955/12/17 ? ? ?This 66 y.o. male patient presents to the clinic for initial evaluation of salvage radiation therapy and patient status post robotic prostatectomy for Gleason 8 (4+4) adenocarcinoma the prostate with positive margin.  Patient staged as stage IIc (CT 2-3 aN0 M0) ? ?REFERRING PHYSICIAN: Jerrol Banana.,* ? ?CHIEF COMPLAINT:  ?Chief Complaint  ?Patient presents with  ? Consult  ?  Prostate Cancer  ? ? ?DIAGNOSIS: There were no encounter diagnoses. ?  ?PREVIOUS INVESTIGATIONS:  ?MRI scans and PSMA PET scans reviewed ?Clinical notes reviewed ?Pathology reports reviewed ? ?HPI: Patient is a 66 year old male presented back in April 2021 with a PSA of 9.3.  It had increased from 6.1 back in March.  He initially was biopsied showing 1 of 12 cores positive for Gleason 6 (3+3) adenocarcinoma.  Patient's PSA continues to increase up to 9.3.  He then underwent a robotic prostatectomy showing Gleason 8 (4+4) adenocarcinoma.  Lymph-vascular invasion was present seminal vesicle invasion was not.  There was invasive carcinoma present in the margin and of the left base.  8 regional lymph nodes were examined all negative for metastatic disease.  His PSA never normalized to 0 had been at 2.6 now is up to 2.92 weeks prior.  He does have incontinence does wear a penile clamp which seems to work effectively.  He is having no bone pain at this time.  PSMA PET scan recently showed no evidence of local prostate cancer recurrence in the prostatic bed no evidence of metastatic adenopathy or visceral or skeletal metastasis.  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 ? ?PLANNED TREATMENT REGIMEN: Salvage radiation therapy ? ?PAST MEDICAL HISTORY:  has a past medical history of Complication of anesthesia, GERD (gastroesophageal reflux disease), Hemorrhoid,  Hyperlipidemia, Joint pain, PONV (postoperative nausea and vomiting), Prostate cancer ([place]), and Reflux.   ? ?PAST SURGICAL HISTORY:  ?Past Surgical History:  ?Procedure Laterality Date  ? ANAL FISSURE REPAIR  2014  ? CARPAL TUNNEL RELEASE Right   ? and elbow surgery  ? COLONOSCOPY WITH PROPOFOL N/A 03/19/2017  ? Procedure: COLONOSCOPY WITH PROPOFOL;  Surgeon: Virgel Manifold, MD;  Location: [facility] ENDOSCOPY;  Service: Endoscopy;  Laterality: N/A;  ? COLONOSCOPY WITH PROPOFOL N/A 08/20/2020  ? Procedure: COLONOSCOPY WITH PROPOFOL;  Surgeon: Lucilla Lame, MD;  Location: [facility] ENDOSCOPY;  Service: Endoscopy;  Laterality: N/A;  ? ESOPHAGOGASTRODUODENOSCOPY (EGD) WITH PROPOFOL N/A 08/20/2020  ? Procedure: ESOPHAGOGASTRODUODENOSCOPY (EGD) WITH PROPOFOL;  Surgeon: Lucilla Lame, MD;  Location: [facility] ENDOSCOPY;  Service: Endoscopy;  Laterality: N/A;  ? PELVIC LYMPH NODE DISSECTION Bilateral 12/02/2020  ? Procedure: PELVIC LYMPH NODE DISSECTION;  Surgeon: [name], MD;  Location: [facility] ORS;  Service: Urology;  Laterality: Bilateral;  ? ROBOT ASSISTED LAPAROSCOPIC RADICAL PROSTATECTOMY N/A 12/02/2020  ? Procedure: XI ROBOTIC ASSISTED LAPAROSCOPIC RADICAL PROSTATECTOMY;  Surgeon: [name], MD;  Location: [facility] ORS;  Service: Urology;  Laterality: N/A;  ? TYMPANOPLASTY Left 04/30/2014  ? TYMPANOPLASTY  10/2014  ? VASECTOMY  01/06/2002  ? ? ?FAMILY HISTORY: family history includes Asthma in his mother; CAD in his father; Diabetes in his maternal aunt and maternal uncle; Healthy in his brother, daughter, daughter, and sister; Heart disease in his father and mother. ? ?SOCIAL HISTORY:  reports that he has never smoked. He has never used smokeless tobacco. He reports that  he does not drink alcohol and does not use drugs. ? ?ALLERGIES: Lovastatin ? ?MEDICATIONS:  ?Current Outpatient Medications  ?Medication Sig Dispense Refill  ? aspirin 81 MG tablet Take 81 mg by mouth daily.    ? azelastine (ASTELIN) 0.1 % nasal spray  Place 1 spray into both nostrils 2 (two) times daily. Use in each nostril as directed 30 mL 6  ? ezetimibe (ZETIA) 10 MG tablet Take 1 tablet (10 mg total) by mouth daily. 90 tablet 4  ? fish oil-omega-3 fatty acids 1000 MG capsule Take 1 g by mouth daily.    ? loratadine (CLARITIN) 10 MG tablet Take 1 tablet (10 mg total) by mouth daily. 90 tablet 1  ? Multiple Vitamins-Minerals (MULTIVITAMIN WITH MINERALS) tablet Take 1 tablet by mouth daily.    ? omeprazole (PRILOSEC) 40 MG capsule Take 1 capsule (40 mg total) by mouth daily. 90 capsule 1  ? pregabalin (LYRICA) 50 MG capsule Take 1 capsule (50 mg total) by mouth 3 (three) times daily. 270 capsule 1  ? Relugolix (ORGOVYX) 120 MG TABS Take 120 mg by mouth daily. 60 tablet 0  ? tadalafil (CIALIS) 5 MG tablet Take 1 tablet (5 mg total) by mouth daily as needed for erectile dysfunction. 30 tablet 11  ? zolpidem (AMBIEN CR) 12.5 MG CR tablet Take 1 tablet (12.5 mg total) by mouth at bedtime as needed. 90 tablet 0  ? ?No current facility-administered medications for this encounter.  ? ? ?ECOG PERFORMANCE STATUS:  0 - Asymptomatic ? ?REVIEW OF SYSTEMS: ?Patient denies any weight loss, fatigue, weakness, fever, chills or night sweats. Patient denies any loss of vision, blurred vision. Patient denies any ringing  of the ears or hearing loss. No irregular heartbeat. Patient denies heart murmur or history of fainting. Patient denies any chest pain or pain radiating to her upper extremities. Patient denies any shortness of breath, difficulty breathing at night, cough or hemoptysis. Patient denies any swelling in the lower legs. Patient denies any nausea vomiting, vomiting of blood, or coffee ground material in the vomitus. Patient denies any stomach pain. Patient states has had normal bowel movements no significant constipation or diarrhea. Patient denies any dysuria, hematuria or significant nocturia. Patient denies any problems walking, swelling in the joints or loss of  balance. Patient denies any skin changes, loss of hair or loss of weight. Patient denies any excessive worrying or anxiety or significant depression. Patient denies any problems with insomnia. Patient denies excessive thirst, polyuria, polydipsia. Patient denies any swollen glands, patient denies easy bruising or easy bleeding. Patient denies any recent infections, allergies or URI. Patient "s visual fields have not changed significantly in recent time. ?  ?PHYSICAL EXAM: ?BP 140/80   Pulse (!) 57   Temp (!) 96.5 ?F (35.8 ?C)   Resp 18   Wt 170 lb 1.6 oz (77.2 kg)   BMI 24.41 kg/m?  ?Well-developed well-nourished patient in NAD. HEENT reveals PERLA, EOMI, discs not visualized.  Oral cavity is clear. No oral mucosal lesions are identified. Neck is clear without evidence of cervical or supraclavicular adenopathy. Lungs are clear to A&P. Cardiac examination is essentially unremarkable with regular rate and rhythm without murmur rub or thrill. Abdomen is benign with no organomegaly or masses noted. Motor sensory and DTR levels are equal and symmetric in the upper and lower extremities. Cranial nerves II through XII are grossly intact. Proprioception is intact. No peripheral adenopathy or edema is identified. No motor or sensory levels are noted. Crude  visual fields are within normal range. ?LABORATORY DATA: Pathology reports reviewed PSA data reviewed ? ?  ?RADIOLOGY RESULTS: MRI scans of PSMA scans reviewed compatible with above-stated findings ? ? ?IMPRESSION: Biochemical failure after robotic assisted prostatectomy in patient with stage IIc adenocarcinoma the prostate with positive margin ? ?PLAN: At this time I run the [facility] on his original tumor values.  This predicts a 15% chance of occult lymph node involvement.  Based on his young age I would treat his both his prostatic fossa as well as his pelvic nodes with IMRT radiation therapy.  Would take his prostatic fossa the 45 Gray and  his pelvic lymph nodes to 54 Gray over 7 weeks.  Risks and benefits of treatment including increased lower urinary tract symptoms possible worsening of his incontinence fatigue alteration of blood counts possible di

## 2021-05-29 ENCOUNTER — Ambulatory Visit: Payer: BC Managed Care – PPO | Admitting: Family Medicine

## 2021-05-29 ENCOUNTER — Ambulatory Visit
Admission: RE | Admit: 2021-05-29 | Discharge: 2021-05-29 | Disposition: A | Discharge status: Home / Self Care | Payer: BC Managed Care – PPO | Source: Ambulatory Visit | Attending: Family Medicine | Admitting: Family Medicine

## 2021-05-29 ENCOUNTER — Ambulatory Visit
Admission: RE | Admit: 2021-05-29 | Discharge: 2021-05-29 | Disposition: A | Discharge status: Home / Self Care | Payer: BC Managed Care – PPO | Attending: Family Medicine | Admitting: Family Medicine

## 2021-05-29 ENCOUNTER — Encounter: Payer: Self-pay | Admitting: Family Medicine

## 2021-05-29 ENCOUNTER — Ambulatory Visit
Admission: RE | Admit: 2021-05-29 | Discharge: 2021-05-29 | Disposition: A | Discharge status: Home / Self Care | Payer: BC Managed Care – PPO | Source: Ambulatory Visit | Attending: Radiation Oncology | Admitting: Radiation Oncology

## 2021-05-29 ENCOUNTER — Other Ambulatory Visit: Payer: Self-pay

## 2021-05-29 VITALS — BP 130/81 | HR 59 | Temp 98.2°F | Resp 16 | Wt 170.0 lb

## 2021-05-29 DIAGNOSIS — C61 Malignant neoplasm of prostate: Secondary | ICD-10-CM

## 2021-05-29 DIAGNOSIS — Z510 Encounter for antineoplastic radiation therapy: Secondary | ICD-10-CM | POA: Insufficient documentation

## 2021-05-29 DIAGNOSIS — R0602 Shortness of breath: Secondary | ICD-10-CM | POA: Diagnosis not present

## 2021-05-29 DIAGNOSIS — R053 Chronic cough: Secondary | ICD-10-CM

## 2021-05-29 DIAGNOSIS — R059 Cough, unspecified: Secondary | ICD-10-CM | POA: Diagnosis not present

## 2021-05-29 DIAGNOSIS — E7800 Pure hypercholesterolemia, unspecified: Secondary | ICD-10-CM

## 2021-05-29 DIAGNOSIS — J329 Chronic sinusitis, unspecified: Secondary | ICD-10-CM | POA: Diagnosis not present

## 2021-05-29 DIAGNOSIS — G629 Polyneuropathy, unspecified: Secondary | ICD-10-CM

## 2021-05-29 MED ORDER — DOXYCYCLINE HYCLATE 100 MG PO TABS: 100.0000 mg | ORAL_TABLET | Freq: Two times a day (BID) | ORAL | 1 refills | Status: DC

## 2021-05-29 NOTE — Progress Notes (Signed)
?  ? ? ?Established patient visit ? ? ?Patient: Dale Gray   DOB: 08/11/1955   66 y.o. Male  MRN: [id number] ?Visit Date: 05/29/2021 ? ?Today's healthcare provider: Megan Mans, MD  ? ?Chief Complaint  ?Patient presents with  ? Allergic Rhinitis   ? Follow-up  ? ?Subjective  ?  ?HPI  ?Patient has had 2 months of waxing and waning fever nasal congestion which is now in his chest and occasional cough feels like it comes deep from his chest. ?Had a prostatectomy 12/02/2020 and last PSA was 2.9.  Radiation therapy next week. ?Follow up for Allergic rhinitis: ? ?The patient was last seen for this 2 weeks ago. ?Changes made at last visit include; seen by Linsey Drubel. Given rx's azelastine nasal spray, loratadine 10 mg.  ? ?He reports good compliance with treatment. ?He feels that condition is Unchanged. ?He is not having side effects. none ? ?----------------------------------------------------------------------------------------- ? ?Patient states medications prescribed are helping somewhat. However he is still having nasal congestion. ? ?Medications: ?Outpatient Medications Prior to Visit  ?Medication Sig  ? aspirin 81 MG tablet Take 81 mg by mouth daily.  ? azelastine (ASTELIN) 0.1 % nasal spray Place 1 spray into both nostrils 2 (two) times daily. Use in each nostril as directed  ? ezetimibe (ZETIA) 10 MG tablet Take 1 tablet (10 mg total) by mouth daily.  ? fish oil-omega-3 fatty acids 1000 MG capsule Take 1 g by mouth daily.  ? loratadine (CLARITIN) 10 MG tablet Take 1 tablet (10 mg total) by mouth daily.  ? Multiple Vitamins-Minerals (MULTIVITAMIN WITH MINERALS) tablet Take 1 tablet by mouth daily.  ? omeprazole (PRILOSEC) 40 MG capsule Take 1 capsule (40 mg total) by mouth daily.  ? pregabalin (LYRICA) 50 MG capsule Take 1 capsule (50 mg total) by mouth 3 (three) times daily.  ? Relugolix (ORGOVYX) 120 MG TABS Take 120 mg by mouth daily.  ? tadalafil (CIALIS) 5 MG tablet Take 1 tablet (5 mg total) by  mouth daily as needed for erectile dysfunction.  ? zolpidem (AMBIEN CR) 12.5 MG CR tablet Take 1 tablet (12.5 mg total) by mouth at bedtime as needed.  ? ?No facility-administered medications prior to visit.  ? ? ?Review of Systems  ?Constitutional:  Negative for appetite change, chills and fever.  ?Respiratory:  Negative for chest tightness, shortness of breath and wheezing.   ?Cardiovascular:  Negative for chest pain and palpitations.  ?Gastrointestinal:  Negative for abdominal pain, nausea and vomiting.  ? ?Last metabolic panel ?Lab Results  ?Component Value Date  ? GLUCOSE 92 05/14/2021  ? NA 143 05/14/2021  ? K 5.3 (H) 05/14/2021  ? CL 103 05/14/2021  ? CO2 26 05/14/2021  ? BUN 14 05/14/2021  ? CREATININE 1.16 05/14/2021  ? EGFR 71 05/14/2021  ? CALCIUM 9.6 05/14/2021  ? PROT 6.8 05/14/2021  ? ALBUMIN 4.7 05/14/2021  ? LABGLOB 2.1 05/14/2021  ? AGRATIO 2.2 05/14/2021  ? BILITOT 0.5 05/14/2021  ? ALKPHOS 73 05/14/2021  ? AST 29 05/14/2021  ? ALT 31 05/14/2021  ? ANIONGAP 8 12/03/2020  ? ?  ?  Objective  ?  ?BP 130/81 (BP Location: Left Arm, Patient Position: Sitting, Cuff Size: Normal)   Pulse (!) 59   Temp 98.2 ?F (36.8 ?C) (Temporal)   Resp 16   Wt 170 lb (77.1 kg)   SpO2 97%   BMI 24.39 kg/m?  ?  ? ?Physical Exam ?Vitals reviewed.  ?Constitutional:   ?  Appearance: He is well-developed.  ?HENT:  ?   Head: Normocephalic and atraumatic.  ?   Right Ear: External ear normal.  ?   Left Ear: External ear normal.  ?   Nose: Nose normal.  ?Eyes:  ?   General:     ?   Right eye: No discharge.  ?   Conjunctiva/sclera: Conjunctivae normal.  ?   Pupils: Pupils are equal, round, and reactive to light.  ?Neck:  ?   Thyroid: No thyromegaly.  ?   Trachea: No tracheal deviation.  ?Cardiovascular:  ?   Rate and Rhythm: Normal rate and regular rhythm.  ?   Heart sounds: Normal heart sounds. No murmur heard. ?Pulmonary:  ?   Effort: Pulmonary effort is normal. No respiratory distress.  ?   Breath sounds: Normal breath  sounds. No decreased breath sounds, wheezing, rhonchi or rales.  ?Chest:  ?   Chest wall: No tenderness.  ?Abdominal:  ?   General: There is no distension.  ?   Palpations: Abdomen is soft. There is no mass.  ?   Tenderness: There is no abdominal tenderness. There is no guarding or rebound.  ?Musculoskeletal:     ?   General: No tenderness. Normal range of motion.  ?   Cervical back: Normal range of motion and neck supple.  ?Lymphadenopathy:  ?   Cervical: No cervical adenopathy.  ?Skin: ?   General: Skin is warm and dry.  ?   Findings: No erythema or rash.  ?Neurological:  ?   Mental Status: He is alert and oriented to person, place, and time.  ?   Cranial Nerves: No cranial nerve deficit.  ?   Motor: No abnormal muscle tone.  ?   Coordination: Coordination normal.  ?   Deep Tendon Reflexes: Reflexes are normal and symmetric. Reflexes normal.  ?Psychiatric:     ?   Behavior: Behavior normal.     ?   Thought Content: Thought content normal.     ?   Judgment: Judgment normal.  ?  ? ? ?No results found for any visits on 05/29/21. ? Assessment & Plan  ?  ? ?1. Chronic sinusitis, unspecified location ?Primary chronic sinusitis.  Treat with doxycycline for a week with a refill. ? ?2. Chronic cough ?Chest x-ray for possible walking pneumonia with his deep cough. ?- DG Chest 2 View; Future ? ?3. Prostate cancer (HCC) ?This post prostatectomy starting radiation therapy next week ? ?4. Hypercholesterolemia without hypertriglyceridemia ? ? ?5. Peripheral polyneuropathy ? ? ? ?No follow-ups on file.  ?   ? ?I, Megan Mans, MD, have reviewed all documentation for this visit. The documentation on 05/30/21 for the exam, diagnosis, procedures, and orders are all accurate and complete. ? ? ? ?Dmoni Fortson Wendelyn Breslow, MD  ?[facility] ?[phone] (phone) ?[phone] (fax) ? ?[facility] ?

## 2021-05-30 ENCOUNTER — Other Ambulatory Visit: Payer: Self-pay | Admitting: *Deleted

## 2021-05-30 DIAGNOSIS — C61 Malignant neoplasm of prostate: Secondary | ICD-10-CM

## 2021-06-02 ENCOUNTER — Encounter: Payer: Self-pay | Admitting: Physical Therapy

## 2021-06-03 DIAGNOSIS — Z510 Encounter for antineoplastic radiation therapy: Secondary | ICD-10-CM | POA: Diagnosis not present

## 2021-06-03 DIAGNOSIS — C61 Malignant neoplasm of prostate: Secondary | ICD-10-CM | POA: Diagnosis not present

## 2021-06-05 ENCOUNTER — Ambulatory Visit: Admission: RE | Admit: 2021-06-05 | Payer: BC Managed Care – PPO | Source: Ambulatory Visit

## 2021-06-05 DIAGNOSIS — C61 Malignant neoplasm of prostate: Secondary | ICD-10-CM | POA: Diagnosis not present

## 2021-06-09 ENCOUNTER — Ambulatory Visit
Admission: RE | Admit: 2021-06-09 | Discharge: 2021-06-09 | Disposition: A | Discharge status: Home / Self Care | Payer: BC Managed Care – PPO | Source: Ambulatory Visit | Attending: Radiation Oncology | Admitting: Radiation Oncology

## 2021-06-09 DIAGNOSIS — C61 Malignant neoplasm of prostate: Secondary | ICD-10-CM | POA: Diagnosis not present

## 2021-06-09 DIAGNOSIS — Z510 Encounter for antineoplastic radiation therapy: Secondary | ICD-10-CM | POA: Insufficient documentation

## 2021-06-10 ENCOUNTER — Ambulatory Visit
Admission: RE | Admit: 2021-06-10 | Discharge: 2021-06-10 | Disposition: A | Discharge status: Home / Self Care | Payer: BC Managed Care – PPO | Source: Ambulatory Visit | Attending: Radiation Oncology | Admitting: Radiation Oncology

## 2021-06-10 DIAGNOSIS — Z510 Encounter for antineoplastic radiation therapy: Secondary | ICD-10-CM | POA: Diagnosis not present

## 2021-06-10 DIAGNOSIS — C61 Malignant neoplasm of prostate: Secondary | ICD-10-CM | POA: Diagnosis not present

## 2021-06-11 ENCOUNTER — Ambulatory Visit
Admission: RE | Admit: 2021-06-11 | Discharge: 2021-06-11 | Disposition: A | Discharge status: Home / Self Care | Payer: BC Managed Care – PPO | Source: Ambulatory Visit | Attending: Radiation Oncology | Admitting: Radiation Oncology

## 2021-06-11 DIAGNOSIS — C61 Malignant neoplasm of prostate: Secondary | ICD-10-CM | POA: Diagnosis not present

## 2021-06-11 DIAGNOSIS — Z510 Encounter for antineoplastic radiation therapy: Secondary | ICD-10-CM | POA: Diagnosis not present

## 2021-06-12 ENCOUNTER — Ambulatory Visit
Admission: RE | Admit: 2021-06-12 | Discharge: 2021-06-12 | Disposition: A | Discharge status: Home / Self Care | Payer: BC Managed Care – PPO | Source: Ambulatory Visit | Attending: Radiation Oncology | Admitting: Radiation Oncology

## 2021-06-12 DIAGNOSIS — Z510 Encounter for antineoplastic radiation therapy: Secondary | ICD-10-CM | POA: Diagnosis not present

## 2021-06-12 DIAGNOSIS — C61 Malignant neoplasm of prostate: Secondary | ICD-10-CM | POA: Diagnosis not present

## 2021-06-13 ENCOUNTER — Ambulatory Visit
Admission: RE | Admit: 2021-06-13 | Discharge: 2021-06-13 | Disposition: A | Discharge status: Home / Self Care | Payer: BC Managed Care – PPO | Source: Ambulatory Visit | Attending: Radiation Oncology | Admitting: Radiation Oncology

## 2021-06-13 DIAGNOSIS — Z510 Encounter for antineoplastic radiation therapy: Secondary | ICD-10-CM | POA: Diagnosis not present

## 2021-06-13 DIAGNOSIS — C61 Malignant neoplasm of prostate: Secondary | ICD-10-CM | POA: Diagnosis not present

## 2021-06-16 ENCOUNTER — Ambulatory Visit
Admission: RE | Admit: 2021-06-16 | Discharge: 2021-06-16 | Disposition: A | Discharge status: Home / Self Care | Payer: BC Managed Care – PPO | Source: Ambulatory Visit | Attending: Radiation Oncology | Admitting: Radiation Oncology

## 2021-06-16 ENCOUNTER — Other Ambulatory Visit: Payer: Self-pay | Admitting: Family Medicine

## 2021-06-16 DIAGNOSIS — C61 Malignant neoplasm of prostate: Secondary | ICD-10-CM | POA: Diagnosis not present

## 2021-06-16 DIAGNOSIS — Z510 Encounter for antineoplastic radiation therapy: Secondary | ICD-10-CM | POA: Diagnosis not present

## 2021-06-17 ENCOUNTER — Other Ambulatory Visit: Payer: Self-pay | Admitting: Family Medicine

## 2021-06-17 ENCOUNTER — Other Ambulatory Visit: Payer: Self-pay | Admitting: *Deleted

## 2021-06-17 ENCOUNTER — Ambulatory Visit
Admission: RE | Admit: 2021-06-17 | Discharge: 2021-06-17 | Disposition: A | Discharge status: Home / Self Care | Payer: BC Managed Care – PPO | Source: Ambulatory Visit | Attending: Radiation Oncology | Admitting: Radiation Oncology

## 2021-06-17 DIAGNOSIS — Z510 Encounter for antineoplastic radiation therapy: Secondary | ICD-10-CM | POA: Diagnosis not present

## 2021-06-17 DIAGNOSIS — C61 Malignant neoplasm of prostate: Secondary | ICD-10-CM | POA: Diagnosis not present

## 2021-06-17 DIAGNOSIS — F5101 Primary insomnia: Secondary | ICD-10-CM

## 2021-06-17 MED ORDER — HYDROCORTISONE (PERIANAL) 2.5 % EX CREA: 1.0000 "application " | TOPICAL_CREAM | Freq: Two times a day (BID) | CUTANEOUS | 0 refills | Status: DC

## 2021-06-18 ENCOUNTER — Ambulatory Visit
Admission: RE | Admit: 2021-06-18 | Discharge: 2021-06-18 | Disposition: A | Discharge status: Home / Self Care | Payer: BC Managed Care – PPO | Source: Ambulatory Visit | Attending: Radiation Oncology | Admitting: Radiation Oncology

## 2021-06-18 ENCOUNTER — Other Ambulatory Visit: Payer: Self-pay | Admitting: *Deleted

## 2021-06-18 DIAGNOSIS — C61 Malignant neoplasm of prostate: Secondary | ICD-10-CM | POA: Diagnosis not present

## 2021-06-18 DIAGNOSIS — Z510 Encounter for antineoplastic radiation therapy: Secondary | ICD-10-CM | POA: Diagnosis not present

## 2021-06-19 ENCOUNTER — Other Ambulatory Visit: Payer: Self-pay | Admitting: Urology

## 2021-06-19 ENCOUNTER — Ambulatory Visit
Admission: RE | Admit: 2021-06-19 | Discharge: 2021-06-19 | Disposition: A | Discharge status: Home / Self Care | Payer: BC Managed Care – PPO | Source: Ambulatory Visit | Attending: Radiation Oncology | Admitting: Radiation Oncology

## 2021-06-19 ENCOUNTER — Telehealth: Payer: Self-pay | Admitting: Urology

## 2021-06-19 DIAGNOSIS — R102 Pelvic and perineal pain: Secondary | ICD-10-CM | POA: Diagnosis not present

## 2021-06-19 DIAGNOSIS — K6389 Other specified diseases of intestine: Secondary | ICD-10-CM | POA: Diagnosis not present

## 2021-06-19 DIAGNOSIS — C61 Malignant neoplasm of prostate: Secondary | ICD-10-CM

## 2021-06-19 DIAGNOSIS — Z510 Encounter for antineoplastic radiation therapy: Secondary | ICD-10-CM | POA: Diagnosis not present

## 2021-06-19 DIAGNOSIS — N281 Cyst of kidney, acquired: Secondary | ICD-10-CM | POA: Diagnosis not present

## 2021-06-19 DIAGNOSIS — R188 Other ascites: Secondary | ICD-10-CM

## 2021-06-19 DIAGNOSIS — Z8546 Personal history of malignant neoplasm of prostate: Secondary | ICD-10-CM | POA: Diagnosis not present

## 2021-06-19 MED ORDER — IOHEXOL 300 MG/ML  SOLN
100.0000 mL | Freq: Once | INTRAMUSCULAR | Status: AC | PRN
  Administered 2021-06-19: 100 mL via INTRAVENOUS

## 2021-06-20 ENCOUNTER — Ambulatory Visit
Admission: RE | Admit: 2021-06-20 | Discharge: 2021-06-20 | Disposition: A | Discharge status: Home / Self Care | Payer: BC Managed Care – PPO | Source: Ambulatory Visit | Attending: Radiation Oncology | Admitting: Radiation Oncology

## 2021-06-20 ENCOUNTER — Telehealth: Payer: Self-pay | Admitting: Urology

## 2021-06-20 ENCOUNTER — Other Ambulatory Visit: Payer: Self-pay | Admitting: *Deleted

## 2021-06-20 DIAGNOSIS — Z510 Encounter for antineoplastic radiation therapy: Secondary | ICD-10-CM | POA: Diagnosis not present

## 2021-06-20 DIAGNOSIS — C61 Malignant neoplasm of prostate: Secondary | ICD-10-CM | POA: Diagnosis not present

## 2021-06-20 NOTE — Telephone Encounter (Signed)
Case discussed with Dr. Dwaine Gale and Dr. Baruch Gouty.  Enlarging perirectal mass scheduled for percutaneous drainage. ? ?[name], MD ? ?

## 2021-06-20 NOTE — Telephone Encounter (Signed)
Pamala Hurry from Scheduling [phone]) called the office today to let us know that she is changing the order for this patient from IR to CT order.  She would like for you to review and make sure the labs are okay and sign off on the order. ? ?Patient is scheduled for Monday, 06/23/21 at 10:30am. ? ? ?

## 2021-06-23 ENCOUNTER — Ambulatory Visit
Admission: RE | Admit: 2021-06-23 | Discharge: 2021-06-23 | Disposition: A | Discharge status: Home / Self Care | Payer: BC Managed Care – PPO | Source: Ambulatory Visit | Attending: Radiation Oncology | Admitting: Radiation Oncology

## 2021-06-23 ENCOUNTER — Other Ambulatory Visit: Payer: Self-pay

## 2021-06-23 ENCOUNTER — Other Ambulatory Visit (HOSPITAL_COMMUNITY): Payer: Self-pay | Admitting: Radiology

## 2021-06-23 ENCOUNTER — Other Ambulatory Visit: Payer: Self-pay | Admitting: Urology

## 2021-06-23 ENCOUNTER — Ambulatory Visit
Admission: RE | Admit: 2021-06-23 | Discharge: 2021-06-23 | Disposition: A | Discharge status: Home / Self Care | Payer: BC Managed Care – PPO | Source: Ambulatory Visit | Attending: Urology | Admitting: Urology

## 2021-06-23 ENCOUNTER — Inpatient Hospital Stay: Payer: BC Managed Care – PPO

## 2021-06-23 DIAGNOSIS — C61 Malignant neoplasm of prostate: Secondary | ICD-10-CM

## 2021-06-23 DIAGNOSIS — R1900 Intra-abdominal and pelvic swelling, mass and lump, unspecified site: Secondary | ICD-10-CM | POA: Diagnosis not present

## 2021-06-23 DIAGNOSIS — R102 Pelvic and perineal pain: Secondary | ICD-10-CM | POA: Insufficient documentation

## 2021-06-23 DIAGNOSIS — K219 Gastro-esophageal reflux disease without esophagitis: Secondary | ICD-10-CM | POA: Diagnosis not present

## 2021-06-23 DIAGNOSIS — Z85038 Personal history of other malignant neoplasm of large intestine: Secondary | ICD-10-CM | POA: Insufficient documentation

## 2021-06-23 DIAGNOSIS — Z8546 Personal history of malignant neoplasm of prostate: Secondary | ICD-10-CM | POA: Diagnosis not present

## 2021-06-23 DIAGNOSIS — Z510 Encounter for antineoplastic radiation therapy: Secondary | ICD-10-CM | POA: Diagnosis not present

## 2021-06-23 DIAGNOSIS — R188 Other ascites: Secondary | ICD-10-CM

## 2021-06-23 DIAGNOSIS — Z86010 Personal history of colonic polyps: Secondary | ICD-10-CM | POA: Insufficient documentation

## 2021-06-23 DIAGNOSIS — E785 Hyperlipidemia, unspecified: Secondary | ICD-10-CM | POA: Diagnosis not present

## 2021-06-23 LAB — CBC
HCT: 39 % (ref 39.0–52.0)
Hemoglobin: 13.2 g/dL (ref 13.0–17.0)
MCH: 30 pg (ref 26.0–34.0)
MCHC: 33.8 g/dL (ref 30.0–36.0)
MCV: 88.6 fL (ref 80.0–100.0)
Platelets: 261 10*3/uL (ref 150–400)
RBC: 4.4 MIL/uL (ref 4.22–5.81)
RDW: 12.5 % (ref 11.5–15.5)
WBC: 7.4 10*3/uL (ref 4.0–10.5)
nRBC: 0 % (ref 0.0–0.2)

## 2021-06-23 MED ORDER — MIDAZOLAM HCL 2 MG/2ML IJ SOLN
INTRAMUSCULAR | Status: AC
  Filled 2021-06-23: qty 2

## 2021-06-23 MED ORDER — FENTANYL CITRATE (PF) 100 MCG/2ML IJ SOLN
INTRAMUSCULAR | Status: AC | PRN
Start: 2021-06-23 — End: 2021-06-23
  Administered 2021-06-23: 50 ug via INTRAVENOUS

## 2021-06-23 MED ORDER — SODIUM CHLORIDE 0.9 % IV SOLN: INTRAVENOUS | Status: DC

## 2021-06-23 MED ORDER — MIDAZOLAM HCL 2 MG/2ML IJ SOLN
INTRAMUSCULAR | Status: AC | PRN
  Administered 2021-06-23: 1 mg via INTRAVENOUS

## 2021-06-23 MED ORDER — FENTANYL CITRATE (PF) 100 MCG/2ML IJ SOLN
INTRAMUSCULAR | Status: AC
  Filled 2021-06-23: qty 2

## 2021-06-23 NOTE — Progress Notes (Signed)
Patient clinically stable post Pelvic lesion aspiration of 400 ml dk brown/red removed. Received Versed 1 mg along with Fentanyl 50 mcg IV for procedure. Denies complaints post procedure. Wife at bedside with update given post procedure. Report given to [facility] Rn post procedure/specials. ?

## 2021-06-23 NOTE — Procedures (Signed)
Interventional Radiology Procedure Note ? ?Date of Procedure: 06/23/2021  ?Procedure: CT aspiration  ? ?Findings:  ?1. CT aspiration of pelvic collection yielded 465m of dark red fluid compatible with liquefied hematoma.   ? ?Complications: No immediate complications noted.  ? ?Estimated Blood Loss: minimal ? ?Follow-up and Recommendations: ?1. Bedrest 1 hour  ? ? ?YAlbin Felling MD  ?Vascular & Interventional Radiology  ?06/23/2021 1:03 PM ? ? ? ?

## 2021-06-23 NOTE — H&P (Signed)
? ?Chief Complaint: ?Pelvic pain. Found to have an enlarging cystic pelvic lesion. Request is for pelvic lesion aspiration possible drain placement ? ?Referring Physician(s): ?Brandon,Ashley ? ?Supervising Physician: Juliet Rude ? ?Patient Status: [place] ? ?History of Present Illness: ?Dale Gray is a 66 y.o. male outpatient. History of  benign neoplasm of ascending colon, GERD, HLD, prostrate cancer s/p radical prostatectomy and pelvic lymph node dissection on 9.26.22, Found to have a large cystic lesion on CT Abd pelvis from 4.13.23 while being worked up for pelvic pain  X 1 week. CT abd pelvis from 4.13.23 reads Large (12.3 x 11.8 x 8.1 cm) cystic pelvic lesion which has significantly enlarged since prior studies. This could be a postoperative seroma or lymphocele. Significant mass effect and compression of the lower sigmoid colon and rectum. Anterior displacement of the bladder..Team is requesting aspiration of the cystic pelvic lesion possible drain placement.Case reviewed and Approved by Dr. Dwaine Gale using CT guidance.   ? ?Patient alert and laying in bed, calm. Wife at bedside. Endorses buttock pain right worse than left) with radiation up back. Denies any fevers, headache, chest pain, SOB, cough, abdominal pain, nausea, vomiting or bleeding. Return precautions and treatment recommendations and follow-up discussed with the patient and wife at bedside who are agreeable with the plan. ? ? ?Past Medical History:  ?Diagnosis Date  ? Complication of anesthesia   ? hard time waking up  ? GERD (gastroesophageal reflux disease)   ? Hemorrhoid   ? Hyperlipidemia   ? Joint pain   ? PONV (postoperative nausea and vomiting)   ? after 1st ear surgery  ? Prostate cancer ([place])   ? Reflux   ? ? ?Past Surgical History:  ?Procedure Laterality Date  ? ANAL FISSURE REPAIR  2014  ? CARPAL TUNNEL RELEASE Right   ? and elbow surgery  ? COLONOSCOPY WITH PROPOFOL N/A 03/19/2017  ? Procedure: COLONOSCOPY WITH  PROPOFOL;  Surgeon: Virgel Manifold, MD;  Location: [facility] ENDOSCOPY;  Service: Endoscopy;  Laterality: N/A;  ? COLONOSCOPY WITH PROPOFOL N/A 08/20/2020  ? Procedure: COLONOSCOPY WITH PROPOFOL;  Surgeon: Lucilla Lame, MD;  Location[facility] ENDOSCOPY;  Service: Endoscopy;  Laterality: N/A;  ? ESOPHAGOGASTRODUODENOSCOPY (EGD) WITH PROPOFOL N/A 08/20/2020  ? Procedure: ESOPHAGOGASTRODUODENOSCOPY (EGD) WITH PROPOFOL;  Surgeon: Lucilla Lame, MD;  Location: [facility] ENDOSCOPY;  Service: Endoscopy;  Laterality: N/A;  ? PELVIC LYMPH NODE DISSECTION Bilateral 12/02/2020  ? Procedure: PELVIC LYMPH NODE DISSECTION;  Surgeon: [name], MD;  Location: [facility] ORS;  Service: Urology;  Laterality: Bilateral;  ? ROBOT ASSISTED LAPAROSCOPIC RADICAL PROSTATECTOMY N/A 12/02/2020  ? Procedure: XI ROBOTIC ASSISTED LAPAROSCOPIC RADICAL PROSTATECTOMY;  Surgeon: [name], MD;  Location: [facility] ORS;  Service: Urology;  Laterality: N/A;  ? TYMPANOPLASTY Left 04/30/2014  ? TYMPANOPLASTY  10/2014  ? VASECTOMY  01/06/2002  ? ? ?Allergies: ?Lovastatin ? ?Medications: ?Prior to Admission medications   ?Medication Sig Start Date End Date Taking? Authorizing Provider  ?aspirin 81 MG tablet Take 81 mg by mouth daily.    [provider]  ?azelastine (ASTELIN) 0.1 % nasal spray Place 1 spray into both nostrils 2 (two) times daily. Use in each nostril as directed 05/14/21   Drubel, Ria Comment, PA-C  ?doxycycline (VIBRA-TABS) 100 MG tablet Take 1 tablet (100 mg total) by mouth 2 (two) times daily. 05/29/21   Jerrol Banana., MD  ?ezetimibe (ZETIA) 10 MG tablet TAKE 1 TABLET DAILY 06/16/21   Birdie Sons, MD  ?fish oil-omega-3 fatty acids  1000 MG capsule Take 1 g by mouth daily.    [provider]  ?hydrocortisone (ANUSOL-HC) 2.5 % rectal cream Place 1 application. rectally 2 (two) times daily. 06/17/21   Noreene Filbert, MD  ?loratadine (CLARITIN) 10 MG tablet Take 1 tablet (10 mg total) by mouth daily. 05/14/21   [name],  PA-C  ?Multiple Vitamins-Minerals (MULTIVITAMIN WITH MINERALS) tablet Take 1 tablet by mouth daily.    [provider]  ?omeprazole (PRILOSEC) 40 MG capsule Take 1 capsule (40 mg total) by mouth daily. 03/13/21   Birdie Sons, MD  ?pregabalin (LYRICA) 50 MG capsule Take 1 capsule (50 mg total) by mouth 3 (three) times daily. 03/13/21   Birdie Sons, MD  ?Relugolix (ORGOVYX) 120 MG TABS Take 120 mg by mouth daily. 05/15/21   [name], MD  ?tadalafil (CIALIS) 5 MG tablet Take 1 tablet (5 mg total) by mouth daily as needed for erectile dysfunction. 05/15/21   [name], MD  ?zolpidem (AMBIEN CR) 12.5 MG CR tablet TAKE 1 TABLET(12.5 MG) BY MOUTH AT BEDTIME AS NEEDED 06/20/21   Jerrol Banana., MD  ?  ? ?Family History  ?Problem Relation Age of Onset  ? Heart disease Mother   ? Asthma Mother   ? Heart disease Father   ? CAD Father   ? Healthy Sister   ? Healthy Brother   ? Healthy Daughter   ? Healthy Daughter   ? Diabetes Maternal Aunt   ? Diabetes Maternal Uncle   ? Colon cancer Neg Hx   ? ? ?Social History  ? ?Socioeconomic History  ? Marital status: Married  ?  Spouse name: Not on file  ? Number of children: Not on file  ? Years of education: Not on file  ? Highest education level: Not on file  ?Occupational History  ? Not on file  ?Tobacco Use  ? Smoking status: Never  ? Smokeless tobacco: Never  ?Vaping Use  ? Vaping Use: Never used  ?Substance and Sexual Activity  ? Alcohol use: No  ? Drug use: No  ? Sexual activity: Not on file  ?Other Topics Concern  ? Not on file  ?Social History Narrative  ? Not on file  ? ?Social Determinants of Health  ? ?Financial Resource Strain: Not on file  ?Food Insecurity: Not on file  ?Transportation Needs: Not on file  ?Physical Activity: Not on file  ?Stress: Not on file  ?Social Connections: Not on file  ? ? ?Review of Systems: A 12 point ROS discussed and pertinent positives are indicated in the HPI above.  All other systems are negative. ? ?Review of  Systems  ?Constitutional:  Negative for fever.  ?HENT:  Negative for congestion.   ?Respiratory:  Negative for cough and shortness of breath.   ?Cardiovascular:  Negative for chest pain.  ?Gastrointestinal:  Negative for abdominal pain.  ?Musculoskeletal:  Positive for myalgias (buttock right worse than left radiating up spine.).  ?Neurological:  Negative for headaches.  ?Psychiatric/Behavioral:  Negative for behavioral problems and confusion.   ? ?Vital Signs: ?There were no vitals taken for this visit. ? ?Physical Exam ?Vitals and nursing note reviewed.  ?Constitutional:   ?   Appearance: He is well-developed.  ?HENT:  ?   Head: Normocephalic.  ?Cardiovascular:  ?   Rate and Rhythm: Regular rhythm. Bradycardia present.  ?   Heart sounds: Normal heart sounds.  ?Pulmonary:  ?   Effort: Pulmonary effort is normal.  ?  Breath sounds: Normal breath sounds.  ?Musculoskeletal:     ?   General: Normal range of motion.  ?   Cervical back: Normal range of motion.  ?Skin: ?   General: Skin is dry.  ?Neurological:  ?   Mental Status: He is alert and oriented to person, place, and time.  ? ? ?Imaging: ?DG Chest 2 View ? ?Result Date: 05/31/2021 ?CLINICAL DATA:  Cough and shortness of breath for 2 months, prostate cancer EXAM: CHEST - 2 VIEW COMPARISON:  None. FINDINGS: The heart size and mediastinal contours are within normal limits. Both lungs are clear. The visualized skeletal structures are unremarkable. IMPRESSION: No active cardiopulmonary disease. Electronically Signed   By: Randa Ngo M.D.   On: 05/31/2021 21:28  ? ?CT Abdomen Pelvis W Contrast ? ?Result Date: 06/19/2021 ?CLINICAL DATA:  History of prostate cancer. Pelvic pain and discomfort for 1 week. EXAM: CT ABDOMEN AND PELVIS WITH CONTRAST TECHNIQUE: Multidetector CT imaging of the abdomen and pelvis was performed using the standard protocol following bolus administration of intravenous contrast. RADIATION DOSE REDUCTION: This exam was performed according to the  departmental dose-optimization program which includes automated exposure control, adjustment of the mA and/or kV according to patient size and/or use of iterative reconstruction technique. CONTRAST:  175m

## 2021-06-24 ENCOUNTER — Ambulatory Visit
Admission: RE | Admit: 2021-06-24 | Discharge: 2021-06-24 | Disposition: A | Discharge status: Home / Self Care | Payer: BC Managed Care – PPO | Source: Ambulatory Visit | Attending: Radiation Oncology | Admitting: Radiation Oncology

## 2021-06-24 ENCOUNTER — Other Ambulatory Visit: Payer: Self-pay

## 2021-06-24 DIAGNOSIS — C61 Malignant neoplasm of prostate: Secondary | ICD-10-CM | POA: Diagnosis not present

## 2021-06-24 DIAGNOSIS — Z510 Encounter for antineoplastic radiation therapy: Secondary | ICD-10-CM | POA: Diagnosis not present

## 2021-06-24 LAB — RAD ONC ARIA SESSION SUMMARY
Course Elapsed Days: 15
Plan Fractions Treated to Date: 12
Plan Prescribed Dose Per Fraction: 2 Gy
Plan Total Fractions Prescribed: 38
Plan Total Prescribed Dose: 76 Gy
Reference Point Dosage Given to Date: 24 Gy
Reference Point Session Dosage Given: 2 Gy
Session Number: 12

## 2021-06-25 ENCOUNTER — Telehealth: Payer: Self-pay

## 2021-06-25 ENCOUNTER — Other Ambulatory Visit: Payer: Self-pay

## 2021-06-25 ENCOUNTER — Ambulatory Visit
Admission: RE | Admit: 2021-06-25 | Discharge: 2021-06-25 | Disposition: A | Discharge status: Home / Self Care | Payer: BC Managed Care – PPO | Source: Ambulatory Visit | Attending: Radiation Oncology | Admitting: Radiation Oncology

## 2021-06-25 DIAGNOSIS — C61 Malignant neoplasm of prostate: Secondary | ICD-10-CM | POA: Diagnosis not present

## 2021-06-25 DIAGNOSIS — Z510 Encounter for antineoplastic radiation therapy: Secondary | ICD-10-CM | POA: Diagnosis not present

## 2021-06-25 LAB — RAD ONC ARIA SESSION SUMMARY
Course Elapsed Days: 16
Plan Fractions Treated to Date: 13
Plan Prescribed Dose Per Fraction: 2 Gy
Plan Total Fractions Prescribed: 38
Plan Total Prescribed Dose: 76 Gy
Reference Point Dosage Given to Date: 26 Gy
Reference Point Session Dosage Given: 2 Gy
Session Number: 13

## 2021-06-25 LAB — CYTOLOGY - NON PAP

## 2021-06-25 NOTE — Telephone Encounter (Signed)
-----   Message from [name], MD sent at 06/25/2021  4:37 PM EDT ----- ?Great news, the fluid that was sent had no cancer cells in it.   ? ?[name], MD ? ?

## 2021-06-25 NOTE — Telephone Encounter (Signed)
Pt aware.

## 2021-06-26 ENCOUNTER — Other Ambulatory Visit: Payer: Self-pay

## 2021-06-26 ENCOUNTER — Ambulatory Visit
Admission: RE | Admit: 2021-06-26 | Discharge: 2021-06-26 | Disposition: A | Discharge status: Home / Self Care | Payer: BC Managed Care – PPO | Source: Ambulatory Visit | Attending: Radiation Oncology | Admitting: Radiation Oncology

## 2021-06-26 ENCOUNTER — Telehealth: Payer: Self-pay

## 2021-06-26 DIAGNOSIS — Z510 Encounter for antineoplastic radiation therapy: Secondary | ICD-10-CM | POA: Diagnosis not present

## 2021-06-26 DIAGNOSIS — C61 Malignant neoplasm of prostate: Secondary | ICD-10-CM | POA: Diagnosis not present

## 2021-06-26 LAB — RAD ONC ARIA SESSION SUMMARY
Course Elapsed Days: 17
Plan Fractions Treated to Date: 14
Plan Prescribed Dose Per Fraction: 2 Gy
Plan Total Fractions Prescribed: 38
Plan Total Prescribed Dose: 76 Gy
Reference Point Dosage Given to Date: 28 Gy
Reference Point Session Dosage Given: 2 Gy
Session Number: 14

## 2021-06-26 MED ORDER — DOXYCYCLINE HYCLATE 100 MG PO TABS
100.0000 mg | ORAL_TABLET | Freq: Two times a day (BID) | ORAL | 0 refills | Status: DC
Start: 2021-06-26 — End: 2021-07-15

## 2021-06-26 NOTE — Telephone Encounter (Signed)
Patient called stating his "crud" has come back again. Michela Pitcher its the same thing he had a month or so ago.   Coughing up stuff and the congestion in his head is back as well.   He has taken all the doxycycline and the refill.    He is asking for another round of antibiotics.  [facility] [address] of [address] at [address] [employer] [address].

## 2021-06-26 NOTE — Telephone Encounter (Signed)
Please advise 

## 2021-06-26 NOTE — Telephone Encounter (Signed)
Done

## 2021-06-26 NOTE — Addendum Note (Signed)
Addended by: Julieta Bellini on: 06/26/2021 04:38 PM ? ? Modules accepted: Orders ? ?

## 2021-06-27 ENCOUNTER — Ambulatory Visit
Admission: RE | Admit: 2021-06-27 | Discharge: 2021-06-27 | Disposition: A | Discharge status: Home / Self Care | Payer: BC Managed Care – PPO | Source: Ambulatory Visit | Attending: Radiation Oncology | Admitting: Radiation Oncology

## 2021-06-27 ENCOUNTER — Other Ambulatory Visit: Payer: Self-pay

## 2021-06-27 DIAGNOSIS — Z510 Encounter for antineoplastic radiation therapy: Secondary | ICD-10-CM | POA: Diagnosis not present

## 2021-06-27 DIAGNOSIS — C61 Malignant neoplasm of prostate: Secondary | ICD-10-CM | POA: Diagnosis not present

## 2021-06-27 LAB — RAD ONC ARIA SESSION SUMMARY
Course Elapsed Days: 18
Plan Fractions Treated to Date: 15
Plan Prescribed Dose Per Fraction: 2 Gy
Plan Total Fractions Prescribed: 38
Plan Total Prescribed Dose: 76 Gy
Reference Point Dosage Given to Date: 30 Gy
Reference Point Session Dosage Given: 2 Gy
Session Number: 15

## 2021-06-30 ENCOUNTER — Ambulatory Visit
Admission: RE | Admit: 2021-06-30 | Discharge: 2021-06-30 | Disposition: A | Discharge status: Home / Self Care | Payer: BC Managed Care – PPO | Source: Ambulatory Visit | Attending: Radiation Oncology | Admitting: Radiation Oncology

## 2021-06-30 ENCOUNTER — Other Ambulatory Visit: Payer: Self-pay

## 2021-06-30 DIAGNOSIS — C61 Malignant neoplasm of prostate: Secondary | ICD-10-CM | POA: Diagnosis not present

## 2021-06-30 DIAGNOSIS — Z510 Encounter for antineoplastic radiation therapy: Secondary | ICD-10-CM | POA: Diagnosis not present

## 2021-06-30 LAB — RAD ONC ARIA SESSION SUMMARY
Course Elapsed Days: 21
Plan Fractions Treated to Date: 16
Plan Prescribed Dose Per Fraction: 2 Gy
Plan Total Fractions Prescribed: 38
Plan Total Prescribed Dose: 76 Gy
Reference Point Dosage Given to Date: 32 Gy
Reference Point Session Dosage Given: 2 Gy
Session Number: 16

## 2021-07-01 ENCOUNTER — Ambulatory Visit
Admission: RE | Admit: 2021-07-01 | Discharge: 2021-07-01 | Disposition: A | Discharge status: Home / Self Care | Payer: BC Managed Care – PPO | Source: Ambulatory Visit | Attending: Radiation Oncology | Admitting: Radiation Oncology

## 2021-07-01 ENCOUNTER — Other Ambulatory Visit: Payer: Self-pay

## 2021-07-01 DIAGNOSIS — Z510 Encounter for antineoplastic radiation therapy: Secondary | ICD-10-CM | POA: Diagnosis not present

## 2021-07-01 DIAGNOSIS — C61 Malignant neoplasm of prostate: Secondary | ICD-10-CM | POA: Diagnosis not present

## 2021-07-01 LAB — RAD ONC ARIA SESSION SUMMARY
Course Elapsed Days: 22
Plan Fractions Treated to Date: 17
Plan Prescribed Dose Per Fraction: 2 Gy
Plan Total Fractions Prescribed: 38
Plan Total Prescribed Dose: 76 Gy
Reference Point Dosage Given to Date: 34 Gy
Reference Point Session Dosage Given: 2 Gy
Session Number: 17

## 2021-07-02 ENCOUNTER — Other Ambulatory Visit: Payer: Self-pay

## 2021-07-02 ENCOUNTER — Ambulatory Visit
Admission: RE | Admit: 2021-07-02 | Discharge: 2021-07-02 | Disposition: A | Discharge status: Home / Self Care | Payer: BC Managed Care – PPO | Source: Ambulatory Visit | Attending: Radiation Oncology | Admitting: Radiation Oncology

## 2021-07-02 DIAGNOSIS — C61 Malignant neoplasm of prostate: Secondary | ICD-10-CM | POA: Diagnosis not present

## 2021-07-02 DIAGNOSIS — Z510 Encounter for antineoplastic radiation therapy: Secondary | ICD-10-CM | POA: Diagnosis not present

## 2021-07-02 LAB — RAD ONC ARIA SESSION SUMMARY
Course Elapsed Days: 23
Plan Fractions Treated to Date: 18
Plan Prescribed Dose Per Fraction: 2 Gy
Plan Total Fractions Prescribed: 38
Plan Total Prescribed Dose: 76 Gy
Reference Point Dosage Given to Date: 36 Gy
Reference Point Session Dosage Given: 2 Gy
Session Number: 18

## 2021-07-03 ENCOUNTER — Other Ambulatory Visit: Payer: Self-pay

## 2021-07-03 ENCOUNTER — Ambulatory Visit
Admission: RE | Admit: 2021-07-03 | Discharge: 2021-07-03 | Disposition: A | Discharge status: Home / Self Care | Payer: BC Managed Care – PPO | Source: Ambulatory Visit | Attending: Radiation Oncology | Admitting: Radiation Oncology

## 2021-07-03 DIAGNOSIS — C61 Malignant neoplasm of prostate: Secondary | ICD-10-CM | POA: Diagnosis not present

## 2021-07-03 DIAGNOSIS — Z510 Encounter for antineoplastic radiation therapy: Secondary | ICD-10-CM | POA: Diagnosis not present

## 2021-07-03 LAB — RAD ONC ARIA SESSION SUMMARY
Course Elapsed Days: 24
Plan Fractions Treated to Date: 19
Plan Prescribed Dose Per Fraction: 2 Gy
Plan Total Fractions Prescribed: 38
Plan Total Prescribed Dose: 76 Gy
Reference Point Dosage Given to Date: 38 Gy
Reference Point Session Dosage Given: 2 Gy
Session Number: 19

## 2021-07-04 ENCOUNTER — Ambulatory Visit
Admission: RE | Admit: 2021-07-04 | Discharge: 2021-07-04 | Disposition: A | Discharge status: Home / Self Care | Payer: BC Managed Care – PPO | Source: Ambulatory Visit | Attending: Radiation Oncology | Admitting: Radiation Oncology

## 2021-07-04 ENCOUNTER — Other Ambulatory Visit: Payer: Self-pay

## 2021-07-04 DIAGNOSIS — Z510 Encounter for antineoplastic radiation therapy: Secondary | ICD-10-CM | POA: Diagnosis not present

## 2021-07-04 DIAGNOSIS — C61 Malignant neoplasm of prostate: Secondary | ICD-10-CM | POA: Diagnosis not present

## 2021-07-04 LAB — RAD ONC ARIA SESSION SUMMARY
Course Elapsed Days: 25
Plan Fractions Treated to Date: 20
Plan Prescribed Dose Per Fraction: 2 Gy
Plan Total Fractions Prescribed: 38
Plan Total Prescribed Dose: 76 Gy
Reference Point Dosage Given to Date: 40 Gy
Reference Point Session Dosage Given: 2 Gy
Session Number: 20

## 2021-07-07 ENCOUNTER — Ambulatory Visit
Admission: RE | Admit: 2021-07-07 | Discharge: 2021-07-07 | Disposition: A | Discharge status: Home / Self Care | Payer: BC Managed Care – PPO | Source: Ambulatory Visit | Attending: Radiation Oncology | Admitting: Radiation Oncology

## 2021-07-07 ENCOUNTER — Inpatient Hospital Stay: Payer: BC Managed Care – PPO | Attending: Radiation Oncology

## 2021-07-07 ENCOUNTER — Other Ambulatory Visit: Payer: Self-pay

## 2021-07-07 ENCOUNTER — Telehealth: Payer: Self-pay | Admitting: *Deleted

## 2021-07-07 DIAGNOSIS — Z510 Encounter for antineoplastic radiation therapy: Secondary | ICD-10-CM | POA: Insufficient documentation

## 2021-07-07 DIAGNOSIS — C61 Malignant neoplasm of prostate: Secondary | ICD-10-CM | POA: Insufficient documentation

## 2021-07-07 LAB — CBC
HCT: 37.6 % — ABNORMAL LOW (ref 39.0–52.0)
Hemoglobin: 12.6 g/dL — ABNORMAL LOW (ref 13.0–17.0)
MCH: 30.2 pg (ref 26.0–34.0)
MCHC: 33.5 g/dL (ref 30.0–36.0)
MCV: 90.2 fL (ref 80.0–100.0)
Platelets: 262 10*3/uL (ref 150–400)
RBC: 4.17 MIL/uL — ABNORMAL LOW (ref 4.22–5.81)
RDW: 13 % (ref 11.5–15.5)
WBC: 7.4 10*3/uL (ref 4.0–10.5)
nRBC: 0 % (ref 0.0–0.2)

## 2021-07-07 LAB — RAD ONC ARIA SESSION SUMMARY
Course Elapsed Days: 28
Plan Fractions Treated to Date: 21
Plan Prescribed Dose Per Fraction: 2 Gy
Plan Total Fractions Prescribed: 38
Plan Total Prescribed Dose: 76 Gy
Reference Point Dosage Given to Date: 42 Gy
Reference Point Session Dosage Given: 2 Gy
Session Number: 21

## 2021-07-07 NOTE — Telephone Encounter (Signed)
I am absolutely aware of this and was in close communication with Dr. Baruch Gouty throughout.  I actually helped coordinate getting the procedure done. ? ?I believe he has a follow-up with me in the next few weeks.  Please have him keep this appointment. ? ?[name], MD ? ?

## 2021-07-07 NOTE — Telephone Encounter (Signed)
Pt calling per the cancer center, pt states he has a "pocket of blood" sounds like a cyst/abscess on the inside of his rectum and he was told by the cancer center to call Dr. [name]. I advised that maybe he needs to reach out to his PCP but pt wanted Dr. Cherrie Gauze thoughts on this since she sent him to the cancer center.  ?Please advise ? ? ?

## 2021-07-08 ENCOUNTER — Ambulatory Visit
Admission: RE | Admit: 2021-07-08 | Discharge: 2021-07-08 | Disposition: A | Discharge status: Home / Self Care | Payer: BC Managed Care – PPO | Source: Ambulatory Visit | Attending: Radiation Oncology | Admitting: Radiation Oncology

## 2021-07-08 ENCOUNTER — Other Ambulatory Visit: Payer: Self-pay

## 2021-07-08 DIAGNOSIS — C61 Malignant neoplasm of prostate: Secondary | ICD-10-CM | POA: Diagnosis not present

## 2021-07-08 DIAGNOSIS — Z510 Encounter for antineoplastic radiation therapy: Secondary | ICD-10-CM | POA: Diagnosis not present

## 2021-07-08 LAB — RAD ONC ARIA SESSION SUMMARY
Course Elapsed Days: 29
Plan Fractions Treated to Date: 22
Plan Prescribed Dose Per Fraction: 2 Gy
Plan Total Fractions Prescribed: 38
Plan Total Prescribed Dose: 76 Gy
Reference Point Dosage Given to Date: 44 Gy
Reference Point Session Dosage Given: 2 Gy
Session Number: 22

## 2021-07-09 ENCOUNTER — Ambulatory Visit
Admission: RE | Admit: 2021-07-09 | Discharge: 2021-07-09 | Disposition: A | Discharge status: Home / Self Care | Payer: BC Managed Care – PPO | Source: Ambulatory Visit | Attending: Radiation Oncology | Admitting: Radiation Oncology

## 2021-07-09 ENCOUNTER — Other Ambulatory Visit: Payer: Self-pay

## 2021-07-09 DIAGNOSIS — C61 Malignant neoplasm of prostate: Secondary | ICD-10-CM | POA: Diagnosis not present

## 2021-07-09 DIAGNOSIS — Z510 Encounter for antineoplastic radiation therapy: Secondary | ICD-10-CM | POA: Diagnosis not present

## 2021-07-09 LAB — RAD ONC ARIA SESSION SUMMARY
Course Elapsed Days: 30
Plan Fractions Treated to Date: 23
Plan Prescribed Dose Per Fraction: 2 Gy
Plan Total Fractions Prescribed: 38
Plan Total Prescribed Dose: 76 Gy
Reference Point Dosage Given to Date: 46 Gy
Reference Point Session Dosage Given: 2 Gy
Session Number: 23

## 2021-07-09 NOTE — Telephone Encounter (Signed)
Patient informed, voiced understanding.  °

## 2021-07-10 ENCOUNTER — Other Ambulatory Visit: Payer: Self-pay | Admitting: *Deleted

## 2021-07-10 ENCOUNTER — Ambulatory Visit
Admission: RE | Admit: 2021-07-10 | Discharge: 2021-07-10 | Disposition: A | Discharge status: Home / Self Care | Payer: BC Managed Care – PPO | Source: Ambulatory Visit | Attending: Radiation Oncology | Admitting: Radiation Oncology

## 2021-07-10 ENCOUNTER — Other Ambulatory Visit: Payer: Self-pay

## 2021-07-10 DIAGNOSIS — C61 Malignant neoplasm of prostate: Secondary | ICD-10-CM | POA: Diagnosis not present

## 2021-07-10 DIAGNOSIS — Z510 Encounter for antineoplastic radiation therapy: Secondary | ICD-10-CM | POA: Diagnosis not present

## 2021-07-10 DIAGNOSIS — N281 Cyst of kidney, acquired: Secondary | ICD-10-CM | POA: Diagnosis not present

## 2021-07-10 LAB — RAD ONC ARIA SESSION SUMMARY
Course Elapsed Days: 31
Plan Fractions Treated to Date: 24
Plan Prescribed Dose Per Fraction: 2 Gy
Plan Total Fractions Prescribed: 38
Plan Total Prescribed Dose: 76 Gy
Reference Point Dosage Given to Date: 48 Gy
Reference Point Session Dosage Given: 2 Gy
Session Number: 24

## 2021-07-10 LAB — POCT I-STAT CREATININE: Creatinine, Ser: 1.1 mg/dL (ref 0.61–1.24)

## 2021-07-10 MED ORDER — IOHEXOL 300 MG/ML  SOLN
100.0000 mL | Freq: Once | INTRAMUSCULAR | Status: AC | PRN
  Administered 2021-07-10: 100 mL via INTRAVENOUS

## 2021-07-11 ENCOUNTER — Other Ambulatory Visit: Payer: Self-pay

## 2021-07-11 ENCOUNTER — Other Ambulatory Visit: Payer: Self-pay | Admitting: *Deleted

## 2021-07-11 ENCOUNTER — Ambulatory Visit
Admission: RE | Admit: 2021-07-11 | Discharge: 2021-07-11 | Disposition: A | Discharge status: Home / Self Care | Payer: BC Managed Care – PPO | Source: Ambulatory Visit | Attending: Radiation Oncology | Admitting: Radiation Oncology

## 2021-07-11 DIAGNOSIS — M799 Soft tissue disorder, unspecified: Secondary | ICD-10-CM

## 2021-07-11 DIAGNOSIS — C61 Malignant neoplasm of prostate: Secondary | ICD-10-CM | POA: Diagnosis not present

## 2021-07-11 DIAGNOSIS — Z510 Encounter for antineoplastic radiation therapy: Secondary | ICD-10-CM | POA: Diagnosis not present

## 2021-07-11 LAB — RAD ONC ARIA SESSION SUMMARY
Course Elapsed Days: 32
Plan Fractions Treated to Date: 25
Plan Prescribed Dose Per Fraction: 2 Gy
Plan Total Fractions Prescribed: 38
Plan Total Prescribed Dose: 76 Gy
Reference Point Dosage Given to Date: 50 Gy
Reference Point Session Dosage Given: 0.6045 Gy
Session Number: 25

## 2021-07-14 ENCOUNTER — Other Ambulatory Visit: Payer: BC Managed Care – PPO

## 2021-07-14 ENCOUNTER — Ambulatory Visit
Admission: RE | Admit: 2021-07-14 | Discharge: 2021-07-14 | Disposition: A | Discharge status: Home / Self Care | Payer: BC Managed Care – PPO | Source: Ambulatory Visit | Attending: Radiation Oncology | Admitting: Radiation Oncology

## 2021-07-14 ENCOUNTER — Other Ambulatory Visit: Payer: Self-pay

## 2021-07-14 VITALS — BP 117/67 | HR 51 | Temp 98.1°F | Resp 14 | Ht 70.0 in | Wt 165.0 lb

## 2021-07-14 DIAGNOSIS — M799 Soft tissue disorder, unspecified: Secondary | ICD-10-CM | POA: Insufficient documentation

## 2021-07-14 DIAGNOSIS — T8143XA Infection following a procedure, organ and space surgical site, initial encounter: Secondary | ICD-10-CM | POA: Diagnosis not present

## 2021-07-14 DIAGNOSIS — R188 Other ascites: Secondary | ICD-10-CM | POA: Insufficient documentation

## 2021-07-14 DIAGNOSIS — C61 Malignant neoplasm of prostate: Secondary | ICD-10-CM | POA: Diagnosis not present

## 2021-07-14 DIAGNOSIS — Z510 Encounter for antineoplastic radiation therapy: Secondary | ICD-10-CM | POA: Diagnosis not present

## 2021-07-14 LAB — RAD ONC ARIA SESSION SUMMARY
Course Elapsed Days: 35
Plan Fractions Treated to Date: 26
Plan Prescribed Dose Per Fraction: 2 Gy
Plan Total Fractions Prescribed: 38
Plan Total Prescribed Dose: 76 Gy
Reference Point Dosage Given to Date: 52 Gy
Reference Point Session Dosage Given: 2 Gy
Session Number: 26

## 2021-07-14 MED ORDER — SODIUM CHLORIDE 0.9 % IV SOLN
INTRAVENOUS | Status: DC
  Administered 2021-07-14: 1000 mL via INTRAVENOUS

## 2021-07-14 MED ORDER — FENTANYL CITRATE (PF) 100 MCG/2ML IJ SOLN
INTRAMUSCULAR | Status: AC | PRN
  Administered 2021-07-14: 50 ug via INTRAVENOUS
  Administered 2021-07-14 (×2): 25 ug via INTRAVENOUS

## 2021-07-14 MED ORDER — MIDAZOLAM HCL 2 MG/2ML IJ SOLN
INTRAMUSCULAR | Status: AC | PRN
  Administered 2021-07-14 (×2): 1 mg via INTRAVENOUS

## 2021-07-14 MED ORDER — SODIUM CHLORIDE 0.9% FLUSH: 5.0000 mL | Freq: Three times a day (TID) | INTRAVENOUS | Status: DC

## 2021-07-14 MED ORDER — MIDAZOLAM HCL 2 MG/2ML IJ SOLN
INTRAMUSCULAR | Status: AC
  Filled 2021-07-14: qty 2

## 2021-07-14 MED ORDER — FENTANYL CITRATE (PF) 100 MCG/2ML IJ SOLN
INTRAMUSCULAR | Status: AC
  Filled 2021-07-14: qty 2

## 2021-07-14 NOTE — Procedures (Signed)
Interventional Radiology Procedure Note ? ?Date of Procedure: 07/14/2021  ?Procedure: CT drain placement  ? ?Findings:  ?1. CT placement of 12 Fr drain into pelvic fluid collection   ? ?Complications: No immediate complications noted.  ? ?Estimated Blood Loss: minimal ? ?Follow-up and Recommendations: ?1. Follow up with IR in 1-2 weeks for potential sclerotherapy planning  ?2. Track daily drain output  ? ? ?Albin Felling, MD  ?Vascular & Interventional Radiology  ?07/14/2021 4:09 PM ? ? ? ?

## 2021-07-14 NOTE — H&P (Signed)
? ?Chief Complaint: ?Patient was seen in consultation today for recurrent symptomatic pelvic cyst at the request of [place] ? ?Referring Physician(s): ?Chrystal,Glenn ? ?Supervising Physician: Juliet Rude ? ?Patient Status: [place] ? ?History of Present Illness: ?Dale Gray is a 66 y.o. male known to our service last seen 4/17 and underwent successful pelvic cyst aspiration yielding 400 mL of dark red fluid consistent with liquefied hematoma. The patient now presents with recurrent pain in the right buttock region rated 6/10 and states this pain restarted about 7 days after the procedure. He did admit to relief following the procedure. Recent imaging 5/4 with evidence of complex cystic lesion in the presacral and perirectal region of the pelvis. Patient presents today for repeat image guided pelvic cyst aspiration with possible drainage catheter placement. The patient denies any current chest pain or shortness of breath. He denies any current blood thinner use, denies any known bleeding or clotting disorder. The patient denies any history of sleep apnea or chronic oxygen use. He has no known complications to sedation.  ? ? ?Past Medical History:  ?Diagnosis Date  ? Complication of anesthesia   ? hard time waking up  ? GERD (gastroesophageal reflux disease)   ? Hemorrhoid   ? Hyperlipidemia   ? Joint pain   ? PONV (postoperative nausea and vomiting)   ? after 1st ear surgery  ? Prostate cancer ([place])   ? Reflux   ? ? ?Past Surgical History:  ?Procedure Laterality Date  ? ANAL FISSURE REPAIR  2014  ? CARPAL TUNNEL RELEASE Right   ? and elbow surgery  ? COLONOSCOPY WITH PROPOFOL N/A 03/19/2017  ? Procedure: COLONOSCOPY WITH PROPOFOL;  Surgeon: Virgel Manifold, MD;  Location: [facility] ENDOSCOPY;  Service: Endoscopy;  Laterality: N/A;  ? COLONOSCOPY WITH PROPOFOL N/A 08/20/2020  ? Procedure: COLONOSCOPY WITH PROPOFOL;  Surgeon: Lucilla Lame, MD;  Location: [facility] ENDOSCOPY;  Service: Endoscopy;   Laterality: N/A;  ? ESOPHAGOGASTRODUODENOSCOPY (EGD) WITH PROPOFOL N/A 08/20/2020  ? Procedure: ESOPHAGOGASTRODUODENOSCOPY (EGD) WITH PROPOFOL;  Surgeon: Lucilla Lame, MD;  Location: [facility] ENDOSCOPY;  Service: Endoscopy;  Laterality: N/A;  ? PELVIC LYMPH NODE DISSECTION Bilateral 12/02/2020  ? Procedure: PELVIC LYMPH NODE DISSECTION;  Surgeon: [name], MD;  Location: [facility] ORS;  Service: Urology;  Laterality: Bilateral;  ? ROBOT ASSISTED LAPAROSCOPIC RADICAL PROSTATECTOMY N/A 12/02/2020  ? Procedure: XI ROBOTIC ASSISTED LAPAROSCOPIC RADICAL PROSTATECTOMY;  Surgeon: [name], MD;  Location: [facility] ORS;  Service: Urology;  Laterality: N/A;  ? TYMPANOPLASTY Left 04/30/2014  ? TYMPANOPLASTY  10/2014  ? VASECTOMY  01/06/2002  ? ? ?Allergies: ?Lovastatin ? ?Medications: ?Prior to Admission medications   ?Medication Sig Start Date End Date Taking? Authorizing Provider  ?aspirin 81 MG tablet Take 81 mg by mouth daily.    [provider]  ?azelastine (ASTELIN) 0.1 % nasal spray Place 1 spray into both nostrils 2 (two) times daily. Use in each nostril as directed 05/14/21   Drubel, Ria Comment, PA-C  ?doxycycline (VIBRA-TABS) 100 MG tablet Take 1 tablet (100 mg total) by mouth 2 (two) times daily. 06/26/21   Jerrol Banana., MD  ?ezetimibe (ZETIA) 10 MG tablet TAKE 1 TABLET DAILY 06/16/21   Birdie Sons, MD  ?fish oil-omega-3 fatty acids 1000 MG capsule Take 1 g by mouth daily.    [provider]  ?hydrocortisone (ANUSOL-HC) 2.5 % rectal cream Place 1 application. rectally 2 (two) times daily. 06/17/21   Noreene Filbert, MD  ?loratadine (CLARITIN) 10 MG tablet  Take 1 tablet (10 mg total) by mouth daily. 05/14/21   [name], PA-C  ?Multiple Vitamins-Minerals (MULTIVITAMIN WITH MINERALS) tablet Take 1 tablet by mouth daily.    [provider]  ?omeprazole (PRILOSEC) 40 MG capsule Take 1 capsule (40 mg total) by mouth daily. 03/13/21   Birdie Sons, MD  ?pregabalin (LYRICA) 50 MG  capsule Take 1 capsule (50 mg total) by mouth 3 (three) times daily. 03/13/21   Birdie Sons, MD  ?Relugolix (ORGOVYX) 120 MG TABS Take 120 mg by mouth daily. 05/15/21   [name], MD  ?tadalafil (CIALIS) 5 MG tablet Take 1 tablet (5 mg total) by mouth daily as needed for erectile dysfunction. 05/15/21   [name], MD  ?zolpidem (AMBIEN CR) 12.5 MG CR tablet TAKE 1 TABLET(12.5 MG) BY MOUTH AT BEDTIME AS NEEDED 06/20/21   Jerrol Banana., MD  ?  ? ?Family History  ?Problem Relation Age of Onset  ? Heart disease Mother   ? Asthma Mother   ? Heart disease Father   ? CAD Father   ? Healthy Sister   ? Healthy Brother   ? Healthy Daughter   ? Healthy Daughter   ? Diabetes Maternal Aunt   ? Diabetes Maternal Uncle   ? Colon cancer Neg Hx   ? ? ?Social History  ? ?Socioeconomic History  ? Marital status: Married  ?  Spouse name: Not on file  ? Number of children: Not on file  ? Years of education: Not on file  ? Highest education level: Not on file  ?Occupational History  ? Not on file  ?Tobacco Use  ? Smoking status: Never  ? Smokeless tobacco: Never  ?Vaping Use  ? Vaping Use: Never used  ?Substance and Sexual Activity  ? Alcohol use: No  ? Drug use: No  ? Sexual activity: Not on file  ?Other Topics Concern  ? Not on file  ?Social History Narrative  ? Not on file  ? ?Social Determinants of Health  ? ?Financial Resource Strain: Not on file  ?Food Insecurity: Not on file  ?Transportation Needs: Not on file  ?Physical Activity: Not on file  ?Stress: Not on file  ?Social Connections: Not on file  ? ?Review of Systems: A 12 point ROS discussed and pertinent positives are indicated in the HPI above.  All other systems are negative. ? ?Review of Systems ? ?Vital Signs: ?BP 111/73   Pulse (!) 55   Temp 98.1 ?F (36.7 ?C) (Oral)   Resp 13   Ht '5\' 10"'$  (1.778 m)   Wt 165 lb (74.8 kg)   SpO2 100%   BMI 23.68 kg/m?  ? ?Physical Exam ?Constitutional:   ?   Appearance: Normal appearance.  ?HENT:  ?   Head:  Normocephalic and atraumatic.  ?Cardiovascular:  ?   Rate and Rhythm: Normal rate and regular rhythm.  ?Pulmonary:  ?   Effort: Pulmonary effort is normal. No respiratory distress.  ?Skin: ?   General: Skin is warm and dry.  ?Neurological:  ?   Mental Status: He is alert and oriented to person, place, and time.  ? ? ?Imaging: ?CT Abdomen Pelvis W Contrast ? ?Result Date: 07/10/2021 ?CLINICAL DATA:  Recurrent pelvic pain. Diarrhea. Recently drained cystic pelvic mass. Previous prostatectomy for prostate carcinoma. EXAM: CT ABDOMEN AND PELVIS WITH CONTRAST TECHNIQUE: Multidetector CT imaging of the abdomen and pelvis was performed using the standard protocol following bolus administration of intravenous contrast. RADIATION DOSE REDUCTION:  This exam was performed according to the departmental dose-optimization program which includes automated exposure control, adjustment of the mA and/or kV according to patient size and/or use of iterative reconstruction technique. CONTRAST:  164m OMNIPAQUE IOHEXOL 300 MG/ML  SOLN COMPARISON:  06/19/2021 FINDINGS: Lower Chest: No acute findings. Hepatobiliary: No hepatic masses identified. Gallbladder is unremarkable. No evidence of biliary ductal dilatation. Pancreas:  No mass or inflammatory changes. Spleen: Within normal limits in size and appearance. Adrenals/Urinary Tract: No masses identified. Benign-appearing renal cysts are again seen bilaterally (no followup imaging is recommended). No evidence of ureteral calculi or hydronephrosis. Stomach/Bowel: No evidence of bowel obstruction or wall thickening. Normal appendix visualized. Vascular/Lymphatic: No pathologically enlarged lymph nodes. No acute vascular findings. Aortic atherosclerotic calcification noted. Reproductive: Prior prostatectomy. A complex cystic lesion with multiple internal septations is again seen in the posterior pelvis in the presacral region, along the right lateral wall and partially encircling the rectum. No  significant solid component is seen. This has decreased in size since prior study, currently measuring 8.9 x 6.8 cm, compared to 12.4 x 8.1 cm previously. No new or enlarging cystic or solid masses identified. Other:

## 2021-07-15 ENCOUNTER — Ambulatory Visit: Payer: BC Managed Care – PPO | Admitting: Family Medicine

## 2021-07-15 ENCOUNTER — Other Ambulatory Visit: Payer: Self-pay

## 2021-07-15 ENCOUNTER — Encounter: Payer: Self-pay | Admitting: Family Medicine

## 2021-07-15 ENCOUNTER — Ambulatory Visit
Admission: RE | Admit: 2021-07-15 | Discharge: 2021-07-15 | Disposition: A | Discharge status: Home / Self Care | Payer: BC Managed Care – PPO | Source: Ambulatory Visit | Attending: Radiation Oncology | Admitting: Radiation Oncology

## 2021-07-15 ENCOUNTER — Other Ambulatory Visit: Payer: Self-pay | Admitting: Interventional Radiology

## 2021-07-15 VITALS — BP 136/76 | HR 64 | Resp 16 | Wt 168.0 lb

## 2021-07-15 DIAGNOSIS — J329 Chronic sinusitis, unspecified: Secondary | ICD-10-CM | POA: Diagnosis not present

## 2021-07-15 DIAGNOSIS — J301 Allergic rhinitis due to pollen: Secondary | ICD-10-CM | POA: Diagnosis not present

## 2021-07-15 DIAGNOSIS — Z510 Encounter for antineoplastic radiation therapy: Secondary | ICD-10-CM | POA: Diagnosis not present

## 2021-07-15 DIAGNOSIS — R188 Other ascites: Secondary | ICD-10-CM

## 2021-07-15 DIAGNOSIS — C61 Malignant neoplasm of prostate: Secondary | ICD-10-CM | POA: Diagnosis not present

## 2021-07-15 LAB — RAD ONC ARIA SESSION SUMMARY
Course Elapsed Days: 36
Plan Fractions Treated to Date: 27
Plan Prescribed Dose Per Fraction: 2 Gy
Plan Total Fractions Prescribed: 38
Plan Total Prescribed Dose: 76 Gy
Reference Point Dosage Given to Date: 54 Gy
Reference Point Session Dosage Given: 2 Gy
Session Number: 27

## 2021-07-15 LAB — PSA: Prostate Specific Ag, Serum: 7.4 ng/mL — ABNORMAL HIGH (ref 0.0–4.0)

## 2021-07-15 MED ORDER — FLUTICASONE PROPIONATE 50 MCG/ACT NA SUSP
2.0000 | Freq: Every day | NASAL | 6 refills | Status: DC
Start: 2021-07-15 — End: 2022-02-03

## 2021-07-15 MED ORDER — PREDNISONE 20 MG PO TABS: 20.0000 mg | ORAL_TABLET | Freq: Every day | ORAL | 1 refills | Status: DC

## 2021-07-15 MED ORDER — CETIRIZINE HCL 10 MG PO TABS: 10.0000 mg | ORAL_TABLET | Freq: Every day | ORAL | 11 refills | Status: DC

## 2021-07-15 NOTE — Progress Notes (Signed)
?  ? ? ?Established patient visit ? ?I,April Miller,acting as a scribe for Megan Mans, MD.,have documented all relevant documentation on the behalf of Megan Mans, MD,as directed by  Megan Mans, MD while in the presence of Megan Mans, MD. ? ? ?Patient: Dale Gray   DOB: [birth year]   66 y.o. Male  MRN: [id number] ?Visit Date: 07/15/2021 ? ?Today's healthcare provider: Megan Mans, MD  ? ?Chief Complaint  ?Patient presents with  ? Follow-up  ? Sinusitis  ? ?Subjective  ?  ?HPI  ?Patient continues to have sinus symptoms with congestion and some postnasal drainage. ?He is convinced he has ongoing sinus infection.  He has never seen ENT. ?No visual disturbance and no significant headaches.  No ear pain. ?Follow up for Chronic sinusitis, unspecified location: ? ?The patient was last seen for this 6 weeks ago. ?Changes made at last visit include; Primary chronic sinusitis. Treat with doxycycline for a week with a refill.obtained chest x-ray. X-ray normal. ? ?He reports good compliance with treatment. ?He feels that condition is Unchanged. ?He is not having side effects.  ? ?----------------------------------------------------------------------------------------- ? ? ?Medications: ?Outpatient Medications Prior to Visit  ?Medication Sig  ? aspirin 81 MG tablet Take 81 mg by mouth daily.  ? azelastine (ASTELIN) 0.1 % nasal spray Place 1 spray into both nostrils 2 (two) times daily. Use in each nostril as directed  ? ezetimibe (ZETIA) 10 MG tablet TAKE 1 TABLET DAILY  ? fish oil-omega-3 fatty acids 1000 MG capsule Take 1 g by mouth daily.  ? hydrocortisone (ANUSOL-HC) 2.5 % rectal cream Place 1 application. rectally 2 (two) times daily.  ? loratadine (CLARITIN) 10 MG tablet Take 1 tablet (10 mg total) by mouth daily.  ? Multiple Vitamins-Minerals (MULTIVITAMIN WITH MINERALS) tablet Take 1 tablet by mouth daily.  ? omeprazole (PRILOSEC) 40 MG capsule Take 1 capsule (40 mg total) by  mouth daily.  ? pregabalin (LYRICA) 50 MG capsule Take 1 capsule (50 mg total) by mouth 3 (three) times daily.  ? Relugolix (ORGOVYX) 120 MG TABS Take 120 mg by mouth daily.  ? tadalafil (CIALIS) 5 MG tablet Take 1 tablet (5 mg total) by mouth daily as needed for erectile dysfunction.  ? zolpidem (AMBIEN CR) 12.5 MG CR tablet TAKE 1 TABLET(12.5 MG) BY MOUTH AT BEDTIME AS NEEDED  ? [DISCONTINUED] doxycycline (VIBRA-TABS) 100 MG tablet Take 1 tablet (100 mg total) by mouth 2 (two) times daily. (Patient not taking: Reported on 07/15/2021)  ? ?No facility-administered medications prior to visit.  ? ? ?Review of Systems  ?Constitutional:  Negative for appetite change, chills and fever.  ?Respiratory:  Negative for chest tightness, shortness of breath and wheezing.   ?Cardiovascular:  Negative for chest pain and palpitations.  ?Gastrointestinal:  Negative for abdominal pain, nausea and vomiting.  ? ?Last CBC ?Lab Results  ?Component Value Date  ? WBC 7.4 07/07/2021  ? HGB 12.6 (L) 07/07/2021  ? HCT 37.6 (L) 07/07/2021  ? MCV 90.2 07/07/2021  ? MCH 30.2 07/07/2021  ? RDW 13.0 07/07/2021  ? PLT 262 07/07/2021  ? ?  ?  Objective  ?  ?BP 136/76 (BP Location: Right Arm, Patient Position: Sitting, Cuff Size: Normal)   Pulse 64   Resp 16   Wt 168 lb (76.2 kg)   SpO2 99%   BMI 24.11 kg/m?  ?BP Readings from Last 3 Encounters:  ?07/16/21 126/77  ?07/15/21 136/76  ?07/14/21 117/67  ? ?Wt Readings from  Last 3 Encounters:  ?07/16/21 165 lb (74.8 kg)  ?07/15/21 168 lb (76.2 kg)  ?07/14/21 165 lb (74.8 kg)  ? ?  ? ?Physical Exam ?Vitals reviewed.  ?Constitutional:   ?   Appearance: He is well-developed.  ?HENT:  ?   Head: Normocephalic and atraumatic.  ?   Right Ear: External ear normal.  ?   Left Ear: External ear normal.  ?   Nose: Nose normal.  ?Eyes:  ?   General:     ?   Right eye: No discharge.  ?   Conjunctiva/sclera: Conjunctivae normal.  ?   Pupils: Pupils are equal, round, and reactive to light.  ?Neck:  ?   Thyroid: No  thyromegaly.  ?   Trachea: No tracheal deviation.  ?Cardiovascular:  ?   Rate and Rhythm: Normal rate and regular rhythm.  ?   Heart sounds: Normal heart sounds. No murmur heard. ?Pulmonary:  ?   Effort: Pulmonary effort is normal. No respiratory distress.  ?   Breath sounds: Normal breath sounds. No decreased breath sounds, wheezing, rhonchi or rales.  ?Chest:  ?   Chest wall: No tenderness.  ?Abdominal:  ?   General: There is no distension.  ?   Palpations: Abdomen is soft. There is no mass.  ?   Tenderness: There is no abdominal tenderness. There is no guarding or rebound.  ?Musculoskeletal:     ?   General: No tenderness. Normal range of motion.  ?   Cervical back: Normal range of motion and neck supple.  ?Lymphadenopathy:  ?   Cervical: No cervical adenopathy.  ?Skin: ?   General: Skin is warm and dry.  ?   Findings: No erythema or rash.  ?Neurological:  ?   Mental Status: He is alert and oriented to person, place, and time.  ?   Cranial Nerves: No cranial nerve deficit.  ?   Motor: No abnormal muscle tone.  ?   Coordination: Coordination normal.  ?   Deep Tendon Reflexes: Reflexes are normal and symmetric. Reflexes normal.  ?Psychiatric:     ?   Behavior: Behavior normal.     ?   Thought Content: Thought content normal.     ?   Judgment: Judgment normal.  ?  ? ? ?No results found for any visits on 07/15/21. ? Assessment & Plan  ?  ? ?1. Seasonal allergic rhinitis due to pollen ?Type the patient may have had a viral sinusitis and now has chronic allergies. ?At this time try Zyrtec 10 mg daily Flonase daily and prednisone 20 mg daily for a week ?2. Chronic sinusitis, unspecified location ?Need ENT referral if symptoms persist. ? ? ?No follow-ups on file.  ?   ? ?I, Megan Mans, MD, have reviewed all documentation for this visit. The documentation on 07/20/21 for the exam, diagnosis, procedures, and orders are all accurate and complete. ? ? ? ?Rosemond Lyttle Wendelyn Breslow, MD  ?[facility] ?[phone] (phone) ?[phone] (fax) ? ?[facility] ?

## 2021-07-15 NOTE — Progress Notes (Signed)
? ?07/16/2021 ?8:38 AM  ? ?Dale Gray ?Jun 04, 1955 ?[id number] ? ?Referring provider:  ?Jerrol Banana., MD ?[address] ?[address] ?[place],  [place] [address] ?Chief Complaint  ?Patient presents with  ? Prostate Cancer  ? ? ? ? ?HPI: ?Dale Gray is a 66 y.o.male with a personal history of unfavorable intermediate prostate cancer, stress urinary incontinence , and erectile dysfunction after prostatectomy who presents today for a 35-monthfollow-up of PSA prior.  ? ?He s/p DaVinci laparoscopic radical prostatectomy and bilateral pelvic lymph node dissection on 12/02/2020. Surgical pathology was negative for malignancy aside from prostate that resulted in invasive prostatic ductal adenocarcinoma. Negative LN, BN, ECE however positive margin at left base which was intraoperative technically difficult and setting a massive vas dilation proximity to rectum as well as absence of posterior plane.  ? ?02/18/2021 PET scan visualize no evidence of local prostate cancer reoccurrence and prostatectomy bed, metastatic adenopathy and no visceral metastasis or skeletal metastasis.  ? ?He is undergoing Salvage IMRT and oral orgavix he has recurrent pelvic fluid massive effect on rectum he s/p percutaneous drainage.  He had a fairly quick recurrence of this and now has a drain in place.  He is scheduled for consideration of sclerotherapy on the 15th.  Cytology was negative differentials could include lymphocele versus seminal vesicle fluid.  ? ?He does report a marked improvement in his midline and pelvic pain.  He was having constant sensation to urinate and defecate.  He is having a little bit of tenderness at the drain site.  No fevers or chills. ? ?His most recent PSA was 7.4 on 07/14/2021 ? ?He is using a penile clamp for  stress urinary incontinence. His erectile dysfunction is managed on Cialis. ? ?He is accompanied by family members today.  His wife is present today and his daughter is present by  telephone. ? ?PSA trend:  ? Prostate Specific Ag, Serum  ?Latest Ref Rng 0.0 - 4.0 ng/mL  ?03/10/2018 6.5 (H)   ?09/12/2018 7.3 (H)   ?10/25/2018 7.3 (H)   ?04/04/2019 9.1 (H)   ?07/31/2019 9.2 (H)   ?12/06/2019 5.0 (H)   ?05/10/2020 6.1 (H)   ?06/11/2020 9.3 (H)   ?12/30/2020 2.6   ?01/24/2021 2.2   ?03/13/2021 2.5   ?05/12/2021 2.9   ?07/14/2021 7.4 (H)   ?  ?(H) High ? ? ? ?PMH: ?Past Medical History:  ?Diagnosis Date  ? Complication of anesthesia   ? hard time waking up  ? GERD (gastroesophageal reflux disease)   ? Hemorrhoid   ? Hyperlipidemia   ? Joint pain   ? PONV (postoperative nausea and vomiting)   ? after 1st ear surgery  ? [place]   ? Reflux   ? ? ?Surgical History: ?Past Surgical History:  ?Procedure Laterality Date  ? ANAL FISSURE REPAIR  2014  ? CARPAL TUNNEL RELEASE Right   ? and elbow surgery  ? COLONOSCOPY WITH PROPOFOL N/A 03/19/2017  ? Procedure: COLONOSCOPY WITH PROPOFOL;  Surgeon: TVirgel Manifold MD;  Location: [facility] ENDOSCOPY;  Service: Endoscopy;  Laterality: N/A;  ? COLONOSCOPY WITH PROPOFOL N/A 08/20/2020  ? Procedure: COLONOSCOPY WITH PROPOFOL;  Surgeon: WLucilla Lame MD;  [facility];  Service: Endoscopy;  Laterality: N/A;  ? ESOPHAGOGASTRODUODENOSCOPY (EGD) WITH PROPOFOL N/A 08/20/2020  ? Procedure: ESOPHAGOGASTRODUODENOSCOPY (EGD) WITH PROPOFOL;  Surgeon: WLucilla Lame MD;  [facility];  Service: Endoscopy;  Laterality: N/A;  ? PELVIC LYMPH NODE DISSECTION Bilateral 12/02/2020  ? Procedure:  PELVIC LYMPH NODE DISSECTION;  Surgeon: [name], MD;  Location: [facility] ORS;  Service: Urology;  Laterality: Bilateral;  ? ROBOT ASSISTED LAPAROSCOPIC RADICAL PROSTATECTOMY N/A 12/02/2020  ? Procedure: XI ROBOTIC ASSISTED LAPAROSCOPIC RADICAL PROSTATECTOMY;  Surgeon: [name], MD;  Location: [facility] ORS;  Service: Urology;  Laterality: N/A;  ? TYMPANOPLASTY Left 04/30/2014  ? TYMPANOPLASTY  10/2014  ? VASECTOMY  01/06/2002  ? ? ?Home Medications:  ?Allergies as of  07/16/2021   ? ?   Reactions  ? Lovastatin   ? Sleep disturbance and extremity pains  ? ?  ? ?  ?Medication List  ?  ? ?  ? Accurate as of Jul 16, 2021  8:38 AM. If you have any questions, ask your nurse or doctor.  ?  ?  ? ?  ? ?aspirin 81 MG tablet ?Take 81 mg by mouth daily. ?  ?azelastine 0.1 % nasal spray ?Commonly known as: ASTELIN ?Place 1 spray into both nostrils 2 (two) times daily. Use in each nostril as directed ?  ?cetirizine 10 MG tablet ?Commonly known as: ZYRTEC ?Take 1 tablet (10 mg total) by mouth daily. ?  ?ezetimibe 10 MG tablet ?Commonly known as: ZETIA ?TAKE 1 TABLET DAILY ?  ?fish oil-omega-3 fatty acids 1000 MG capsule ?Take 1 g by mouth daily. ?  ?fluticasone 50 MCG/ACT nasal spray ?Commonly known as: FLONASE ?Place 2 sprays into both nostrils daily. ?  ?hydrocortisone 2.5 % rectal cream ?Commonly known as: Anusol-HC ?Place 1 application. rectally 2 (two) times daily. ?  ?loratadine 10 MG tablet ?Commonly known as: CLARITIN ?Take 1 tablet (10 mg total) by mouth daily. ?  ?multivitamin with minerals tablet ?Take 1 tablet by mouth daily. ?  ?omeprazole 40 MG capsule ?Commonly known as: PRILOSEC ?Take 1 capsule (40 mg total) by mouth daily. ?  ?Orgovyx 120 MG Tabs ?Generic drug: Relugolix ?Take 120 mg by mouth daily. ?  ?predniSONE 20 MG tablet ?Commonly known as: DELTASONE ?Take 1 tablet (20 mg total) by mouth daily with breakfast. ?  ?pregabalin 50 MG capsule ?Commonly known as: LYRICA ?Take 1 capsule (50 mg total) by mouth 3 (three) times daily. ?  ?tadalafil 5 MG tablet ?Commonly known as: CIALIS ?Take 1 tablet (5 mg total) by mouth daily as needed for erectile dysfunction. ?  ?zolpidem 12.5 MG CR tablet ?Commonly known as: AMBIEN CR ?TAKE 1 TABLET(12.5 MG) BY MOUTH AT BEDTIME AS NEEDED ?  ? ?  ? ? ?Allergies:  ?Allergies  ?Allergen Reactions  ? Lovastatin   ?  Sleep disturbance and extremity pains  ? ? ?Family History: ?Family History  ?Problem Relation Age of Onset  ? Heart disease Mother    ? Asthma Mother   ? Heart disease Father   ? CAD Father   ? Healthy Sister   ? Healthy Brother   ? Healthy Daughter   ? Healthy Daughter   ? Diabetes Maternal Aunt   ? Diabetes Maternal Uncle   ? Colon cancer Neg Hx   ? ? ?Social History:  reports that he has never smoked. He has never used smokeless tobacco. He reports that he does not drink alcohol and does not use drugs. ? ? ?Physical Exam: ?BP 126/77   Pulse (!) 59   Ht '5\' 10"'$  (1.778 m)   Wt 165 lb (74.8 kg)   BMI 23.68 kg/m?   ?Constitutional:  Alert and oriented, No acute distress. ?HEENT: [place] AT, moist mucus membranes.  Trachea midline, no masses. ?Cardiovascular: No  clubbing, cyanosis, or edema. ?Respiratory: Normal respiratory effort, no increased work of breathing. ?Skin: No rashes, bruises or suspicious lesions. ?Neurologic: Grossly intact, no focal deficits, moving all 4 extremities. ?Psychiatric: Normal mood and affect. ? ?Laboratory Data: ?Lab Results  ?Component Value Date  ? CREATININE 1.10 07/10/2021  ? ?Lab Results  ?Component Value Date  ? HGBA1C 5.6 05/14/2021  ? ? ?I personally reviewed serial imaging today of CT of the pelvis x2 as well as IR imaging at the time of drainage.  Agree with radiologic interpretation. ? ?Assessment & Plan:   ? ?Prostate cancer  ?-Less favorable Intraductal with positive margin at the left base ?- PSA continues to remain detectable and rising slowly ?-His PSA was accidentally checked in the middle of radiation which is markedly elevated today, suspect this likely secondary to a proptosis and cannot be interpreted ?- He currently undergo  salvage IMRT recommend extending ADT for addition of 4 months for a total of 6 months his preference is to continue Orgovyx  if it is not affordable he is agreeable to a 4 month depo Eligard  ?- Perscription sent  ? ?2. Pelvic fluid collection  ?- S/p pelvic drainage placement leading sclerosis  ?- Cytology was negative differentials could include lymphocele versus seminal  vesicle fluid ?-Advised that after the drain is ultimately removed, let us know if he has any recurrent symptoms and I can help manage this ? ?F/u  4 months with PSA ? ?I,Kailey Littlejohn,acting as a Education administrator for [employer]

## 2021-07-16 ENCOUNTER — Telehealth: Payer: Self-pay

## 2021-07-16 ENCOUNTER — Other Ambulatory Visit: Payer: Self-pay

## 2021-07-16 ENCOUNTER — Ambulatory Visit
Admission: RE | Admit: 2021-07-16 | Discharge: 2021-07-16 | Disposition: A | Discharge status: Home / Self Care | Payer: BC Managed Care – PPO | Source: Ambulatory Visit | Attending: Radiation Oncology | Admitting: Radiation Oncology

## 2021-07-16 ENCOUNTER — Ambulatory Visit: Payer: BC Managed Care – PPO | Admitting: Urology

## 2021-07-16 VITALS — BP 126/77 | HR 59 | Ht 70.0 in | Wt 165.0 lb

## 2021-07-16 DIAGNOSIS — C61 Malignant neoplasm of prostate: Secondary | ICD-10-CM

## 2021-07-16 DIAGNOSIS — R188 Other ascites: Secondary | ICD-10-CM

## 2021-07-16 DIAGNOSIS — Z510 Encounter for antineoplastic radiation therapy: Secondary | ICD-10-CM | POA: Diagnosis not present

## 2021-07-16 LAB — RAD ONC ARIA SESSION SUMMARY
Course Elapsed Days: 37
Plan Fractions Treated to Date: 28
Plan Prescribed Dose Per Fraction: 2 Gy
Plan Total Fractions Prescribed: 38
Plan Total Prescribed Dose: 76 Gy
Reference Point Dosage Given to Date: 56 Gy
Reference Point Session Dosage Given: 2 Gy
Session Number: 28

## 2021-07-16 MED ORDER — ORGOVYX 120 MG PO TABS: 120.0000 mg | ORAL_TABLET | Freq: Every day | ORAL | 0 refills | Status: DC

## 2021-07-16 NOTE — Telephone Encounter (Signed)
Incoming call from pts wife in regards to [facility]. She states their insurance told them the only pharmacy that they can get it from is a pharmacy called [facility], mail order pharmacy. They also told her it would take up to 72 hrs for approval and she and the pt will be out of town the entire week next week, unable to pick up the medication until they come back on Monday May 22nd. She would like to know if patient can go until the 22nd without the medication. She would like a call back.  ?

## 2021-07-17 ENCOUNTER — Ambulatory Visit
Admission: RE | Admit: 2021-07-17 | Discharge: 2021-07-17 | Disposition: A | Discharge status: Home / Self Care | Payer: BC Managed Care – PPO | Source: Ambulatory Visit | Attending: Radiation Oncology | Admitting: Radiation Oncology

## 2021-07-17 ENCOUNTER — Other Ambulatory Visit: Payer: Self-pay

## 2021-07-17 DIAGNOSIS — C61 Malignant neoplasm of prostate: Secondary | ICD-10-CM | POA: Diagnosis not present

## 2021-07-17 DIAGNOSIS — Z510 Encounter for antineoplastic radiation therapy: Secondary | ICD-10-CM | POA: Diagnosis not present

## 2021-07-17 LAB — RAD ONC ARIA SESSION SUMMARY
Course Elapsed Days: 38
Plan Fractions Treated to Date: 1
Plan Prescribed Dose Per Fraction: 2 Gy
Plan Total Fractions Prescribed: 10
Plan Total Prescribed Dose: 20 Gy
Reference Point Dosage Given to Date: 58 Gy
Reference Point Session Dosage Given: 2 Gy
Session Number: 29

## 2021-07-18 ENCOUNTER — Ambulatory Visit
Admission: RE | Admit: 2021-07-18 | Discharge: 2021-07-18 | Disposition: A | Discharge status: Home / Self Care | Payer: BC Managed Care – PPO | Source: Ambulatory Visit | Attending: Radiation Oncology | Admitting: Radiation Oncology

## 2021-07-18 ENCOUNTER — Other Ambulatory Visit: Payer: Self-pay

## 2021-07-18 DIAGNOSIS — Z510 Encounter for antineoplastic radiation therapy: Secondary | ICD-10-CM | POA: Diagnosis not present

## 2021-07-18 DIAGNOSIS — C61 Malignant neoplasm of prostate: Secondary | ICD-10-CM | POA: Diagnosis not present

## 2021-07-18 LAB — RAD ONC ARIA SESSION SUMMARY
Course Elapsed Days: 39
Plan Fractions Treated to Date: 2
Plan Prescribed Dose Per Fraction: 2 Gy
Plan Total Fractions Prescribed: 10
Plan Total Prescribed Dose: 20 Gy
Reference Point Dosage Given to Date: 60 Gy
Reference Point Session Dosage Given: 2 Gy
Session Number: 30

## 2021-07-18 NOTE — Telephone Encounter (Signed)
Spoke with patient's wife regarding medication, per Dr. [name] ok to wait till RX comes in.  ?

## 2021-07-21 ENCOUNTER — Ambulatory Visit: Payer: BC Managed Care – PPO

## 2021-07-21 ENCOUNTER — Ambulatory Visit
Admission: RE | Admit: 2021-07-21 | Discharge: 2021-07-21 | Disposition: A | Discharge status: Home / Self Care | Payer: BC Managed Care – PPO | Source: Ambulatory Visit | Attending: Interventional Radiology | Admitting: Interventional Radiology

## 2021-07-21 ENCOUNTER — Inpatient Hospital Stay: Payer: BC Managed Care – PPO

## 2021-07-21 DIAGNOSIS — R188 Other ascites: Secondary | ICD-10-CM | POA: Diagnosis not present

## 2021-07-21 HISTORY — PX: IR RADIOLOGIST EVAL & MGMT: IMG5224

## 2021-07-21 NOTE — Progress Notes (Signed)
IR brief telephone note   ? ?Spoke to [place] this afternoon. Doing well since drain placement. Drain output is ~5m/day transitioning from dark red to lighter brown color. He does report return of some of the rectal discomfort he was having before the drain was placed.  ? ?He is traveling this week and is out of town.  ? ?Plan to schedule him for CT and potential IR sclero next week. Will continue to track drain output. CT will show drain position and any undrained component prior to potential sclerotherapy.  ? ? ?YAlbin Felling MD  ?Vascular and Interventional Radiology ?07/21/2021 2:36 PM  ? ?  ?

## 2021-07-21 NOTE — Progress Notes (Signed)
Patient placed on schedule for IR scherotherapy procedure 5/24, called and spoke with Barbara/wife on phone with pre procedure instructions given. Made aware to be here @ 0930 after radiation treatment, NPO after Mn prior to procedure as well as driver post procedure/recovery/discharge. Stated understanding. ?

## 2021-07-22 ENCOUNTER — Ambulatory Visit: Payer: BC Managed Care – PPO

## 2021-07-23 ENCOUNTER — Ambulatory Visit: Payer: BC Managed Care – PPO

## 2021-07-24 ENCOUNTER — Ambulatory Visit: Payer: BC Managed Care – PPO

## 2021-07-24 ENCOUNTER — Other Ambulatory Visit (HOSPITAL_COMMUNITY): Payer: Self-pay | Admitting: Interventional Radiology

## 2021-07-24 DIAGNOSIS — R188 Other ascites: Secondary | ICD-10-CM

## 2021-07-24 NOTE — H&P (Incomplete)
Chief Complaint: Patient was seen in consultation today for recurrent symptomatic pelvic cyst at the request of Jerrol Banana.  Referring Physician(s): Jerrol Banana.  Supervising Physician: Juliet Rude  Patient Status: Dale Gray - In-pt  History of Present Illness: Dale Gray is a 66 y.o. male well-known to IR after undergoing pelvic cyst aspiration 06/23/2021 and pelvic drain placement for recurrent pelvic cyst 07/14/2021.  Patient has continued to have approximately 40 mL/day output and return of rectal discomfort he was experiencing before having drain placed.  Patient called 07/24/2021 and complained of severe pain with existing drain.  Due to persistent, severe pain patient returns today for CT abdomen pelvis and possible sclerotherapy treatment.  Past Medical History:  Diagnosis Date   Complication of anesthesia    hard time waking up   GERD (gastroesophageal reflux disease)    Hemorrhoid    Hyperlipidemia    Joint pain    PONV (postoperative nausea and vomiting)    after 1st ear surgery   Prostate cancer ([place])    Reflux     Past Surgical History:  Procedure Laterality Date   ANAL FISSURE REPAIR  2014   CARPAL TUNNEL RELEASE Right    and elbow surgery   COLONOSCOPY WITH PROPOFOL N/A 03/19/2017   Procedure: COLONOSCOPY WITH PROPOFOL;  Surgeon: Virgel Manifold, MD;  Location: [facility] ENDOSCOPY;  Service: Endoscopy;  Laterality: N/A;   COLONOSCOPY WITH PROPOFOL N/A 08/20/2020   Procedure: COLONOSCOPY WITH PROPOFOL;  Surgeon: Lucilla Lame, MD;  Location: [facility] ENDOSCOPY;  Service: Endoscopy;  Laterality: N/A;   ESOPHAGOGASTRODUODENOSCOPY (EGD) WITH PROPOFOL N/A 08/20/2020   Procedure: ESOPHAGOGASTRODUODENOSCOPY (EGD) WITH PROPOFOL;  Surgeon: Lucilla Lame, MD;  Location: [facility] ENDOSCOPY;  Service: Endoscopy;  Laterality: N/A;   IR RADIOLOGIST EVAL & MGMT  07/21/2021   PELVIC LYMPH NODE DISSECTION Bilateral 12/02/2020   Procedure: PELVIC LYMPH NODE  DISSECTION;  Surgeon: [name], MD;  Location: [facility] ORS;  Service: Urology;  Laterality: Bilateral;   ROBOT ASSISTED LAPAROSCOPIC RADICAL PROSTATECTOMY N/A 12/02/2020   Procedure: XI ROBOTIC ASSISTED LAPAROSCOPIC RADICAL PROSTATECTOMY;  Surgeon: [name], MD;  Location: [facility] ORS;  Service: Urology;  Laterality: N/A;   TYMPANOPLASTY Left 04/30/2014   TYMPANOPLASTY  10/2014   VASECTOMY  01/06/2002    Allergies: Lovastatin  Medications: Prior to Admission medications   Medication Sig Start Date End Date Taking? Authorizing Provider  aspirin 81 MG tablet Take 81 mg by mouth daily.    [provider]  azelastine (ASTELIN) 0.1 % nasal spray Place 1 spray into both nostrils 2 (two) times daily. Use in each nostril as directed 05/14/21   Drubel, Ria Comment, PA-C  cetirizine (ZYRTEC) 10 MG tablet Take 1 tablet (10 mg total) by mouth daily. 07/15/21   Jerrol Banana., MD  ezetimibe (ZETIA) 10 MG tablet TAKE 1 TABLET DAILY 06/16/21   Birdie Sons, MD  fish oil-omega-3 fatty acids 1000 MG capsule Take 1 g by mouth daily.    [provider]  fluticasone (FLONASE) 50 MCG/ACT nasal spray Place 2 sprays into both nostrils daily. 07/15/21   Jerrol Banana., MD  hydrocortisone (ANUSOL-HC) 2.5 % rectal cream Place 1 application. rectally 2 (two) times daily. 06/17/21   Noreene Filbert, MD  loratadine (CLARITIN) 10 MG tablet Take 1 tablet (10 mg total) by mouth daily. 05/14/21   [name], PA-C  Multiple Vitamins-Minerals (MULTIVITAMIN WITH MINERALS) tablet Take 1 tablet by mouth daily.    [provider]  omeprazole (PRILOSEC) 40 MG capsule Take 1 capsule (40 mg total) by mouth daily. 03/13/21   Birdie Sons, MD  predniSONE (DELTASONE) 20 MG tablet Take 1 tablet (20 mg total) by mouth daily with breakfast. 07/15/21   Jerrol Banana., MD  pregabalin (LYRICA) 50 MG capsule Take 1 capsule (50 mg total) by mouth 3 (three) times daily. 03/13/21   Birdie Sons, MD  Relugolix (ORGOVYX) 120 MG TABS Take 120 mg by mouth daily. 07/16/21   [name], MD  tadalafil (CIALIS) 5 MG tablet Take 1 tablet (5 mg total) by mouth daily as needed for erectile dysfunction. 05/15/21   [name], MD  zolpidem (AMBIEN CR) 12.5 MG CR tablet TAKE 1 TABLET(12.5 MG) BY MOUTH AT BEDTIME AS NEEDED 06/20/21   Jerrol Banana., MD     Family History  Problem Relation Age of Onset   Heart disease Mother    Asthma Mother    Heart disease Father    CAD Father    Healthy Sister    Healthy Brother    Healthy Daughter    Healthy Daughter    Diabetes Maternal Aunt    Diabetes Maternal Uncle    Colon cancer Neg Hx     Social History   Socioeconomic History   Marital status: Married    Spouse name: Not on file   Number of children: Not on file   Years of education: Not on file   Highest education level: Not on file  Occupational History   Not on file  Tobacco Use   Smoking status: Never   Smokeless tobacco: Never  Vaping Use   Vaping Use: Never used  Substance and Sexual Activity   Alcohol use: No   Drug use: No   Sexual activity: Not on file  Other Topics Concern   Not on file  Social History Narrative   Not on file   Social Determinants of Health   Financial Resource Strain: Not on file  Food Insecurity: Not on file  Transportation Needs: Not on file  Physical Activity: Not on file  Stress: Not on file  Social Connections: Not on file    Review of Systems: A 12 point ROS discussed and pertinent positives are indicated in the HPI above.  All other systems are negative.  Review of Systems  Vital Signs: There were no vitals taken for this visit.  Physical Exam  Imaging: CT Abdomen Pelvis W Contrast  Result Date: 07/10/2021 CLINICAL DATA:  Recurrent pelvic pain. Diarrhea. Recently drained cystic pelvic mass. Previous prostatectomy for prostate carcinoma. EXAM: CT ABDOMEN AND PELVIS WITH CONTRAST TECHNIQUE: Multidetector CT  imaging of the abdomen and pelvis was performed using the standard protocol following bolus administration of intravenous contrast. RADIATION DOSE REDUCTION: This exam was performed according to the departmental dose-optimization program which includes automated exposure control, adjustment of the mA and/or kV according to patient size and/or use of iterative reconstruction technique. CONTRAST:  120m OMNIPAQUE IOHEXOL 300 MG/ML  SOLN COMPARISON:  06/19/2021 FINDINGS: Lower Chest: No acute findings. Hepatobiliary: No hepatic masses identified. Gallbladder is unremarkable. No evidence of biliary ductal dilatation. Pancreas:  No mass or inflammatory changes. Spleen: Within normal limits in size and appearance. Adrenals/Urinary Tract: No masses identified. Benign-appearing renal cysts are again seen bilaterally (no followup imaging is recommended). No evidence of ureteral calculi or hydronephrosis. Stomach/Bowel: No evidence of bowel obstruction or wall thickening. Normal appendix visualized. Vascular/Lymphatic: No pathologically enlarged lymph nodes. No acute  vascular findings. Aortic atherosclerotic calcification noted. Reproductive: Prior prostatectomy. A complex cystic lesion with multiple internal septations is again seen in the posterior pelvis in the presacral region, along the right lateral wall and partially encircling the rectum. No significant solid component is seen. This has decreased in size since prior study, currently measuring 8.9 x 6.8 cm, compared to 12.4 x 8.1 cm previously. No new or enlarging cystic or solid masses identified. Other:  None. Musculoskeletal:  No suspicious bone lesions identified. IMPRESSION: Decreased size of complex cystic lesion in presacral and perirectal regions of the pelvis. No acute findings.  No evidence of metastatic prostate carcinoma. Aortic Atherosclerosis (ICD10-I70.0). Electronically Signed   By: Marlaine Hind M.D.   On: 07/10/2021 11:18   CT ASPIRATION  Result  Date: 07/14/2021 INDICATION: Recurrent pelvic fluid collection following prostatectomy EXAM: CT-guided placement of drainage catheter into pelvic fluid collection TECHNIQUE: Multidetector CT imaging of the pelvis was performed following the standard protocol without IV contrast. RADIATION DOSE REDUCTION: This exam was performed according to the departmental dose-optimization program which includes automated exposure control, adjustment of the mA and/or kV according to patient size and/or use of iterative reconstruction technique. MEDICATIONS: Per EMR ANESTHESIA/SEDATION: Moderate (conscious) sedation was employed during this procedure. A total of Versed 2 mg and Fentanyl 100 mcg was administered intravenously by the radiology nurse. Total intra-service moderate Sedation Time: 24 minutes. The patient's level of consciousness and vital signs were monitored continuously by radiology nursing throughout the procedure under my direct supervision. COMPLICATIONS: None immediate. PROCEDURE: Informed written consent was obtained from the patient after a thorough discussion of the procedural risks, benefits and alternatives. All questions were addressed. Maximal Sterile Barrier Technique was utilized including caps, mask, sterile gowns, sterile gloves, sterile drape, hand hygiene and skin antiseptic. A timeout was performed prior to the initiation of the procedure. The patient was placed prone on the exam table. Limited CT of the pelvis was performed for planning purposes. This again demonstrated a recurrent fluid collection in the lower pelvis adjacent to the rectum. Skin entry site was marked, and the overlying skin was prepped and draped in the standard sterile fashion. Local analgesia was obtained with 1% lidocaine. Using intermittent CT fluoroscopy, a 19 gauge Yueh needle was advanced into the pelvic fluid collection using a right parasacral approach. Location was confirmed with CT and return of dark red fluid. This was  similar in nature to previous aspiration. Decision was made to proceed with drainage catheter placement due to recurrence and plan for potential sclerotherapy access. Over an 035 Amplatz wire, the access site was serially dilated to accommodate a 12 [place] multipurpose locking drainage catheter which was advanced into the pelvic fluid collection. Location was confirmed with CT and additional return of dark red fluid. A total of approximately 175 mL of dark red fluid was aspirated. The drainage catheter was attached to bulb suction. It was secured to the skin using silk suture and a dressing. The patient tolerated the procedure well, and was transferred to recovery in stable condition. IMPRESSION: 1. Successful CT-guided placement of 12 French percutaneous drainage catheter into recurrent pelvic fluid collection. 2. Following interdisciplinary discussion with referring Urology service, interventional radiology will follow-up with the patient for potential sclerotherapy treatment. Plan for potential sclerotherapy following complete decompression via drainage catheter and assessment of drain output. A thorough drain injection will be performed prior to sclerotherapy, to assess for any fistulous connection, including to the seminal vesicles. Electronically Signed   By: Albin Felling  M.D.   On: 07/14/2021 16:25   IR Radiologist Eval & Mgmt  Result Date: 07/21/2021 EXAM: Refer to EMR CHIEF COMPLAINT: Refer to EMR HISTORY OF PRESENT ILLNESS: Refer to EMR REVIEW OF SYSTEMS: Refer to EMR PHYSICAL EXAMINATION: Refer to EMR ASSESSMENT AND PLAN: Refer to EMR Electronically Signed   By: Albin Felling M.D.   On: 07/21/2021 14:33    Labs:  CBC: Recent Labs    12/03/20 0420 05/14/21 0922 06/23/21 0829 07/07/21 0831  WBC 18.0* 6.4 7.4 7.4  HGB 11.8* 13.5 13.2 12.6*  HCT 33.7* 41.6 39.0 37.6*  PLT 249 319 261 262    COAGS: Recent Labs    11/28/20 0818  INR 1.0    BMP: Recent Labs    11/28/20 0818  12/03/20 0420 05/14/21 0922 07/10/21 1051  NA 138 137 143  --   K 3.6 5.0 5.3*  --   CL 101 104 103  --   CO2 '26 25 26  '$ --   GLUCOSE 139* 127* 92  --   BUN '18 14 14  '$ --   CALCIUM 9.4 8.5* 9.6  --   CREATININE 1.24 1.28* 1.16 1.10  GFRNONAA >60 >60  --   --     LIVER FUNCTION TESTS: Recent Labs    05/14/21 0922  BILITOT 0.5  AST 29  ALT 31  ALKPHOS 73  PROT 6.8  ALBUMIN 4.7    TUMOR MARKERS: No results for input(s): AFPTM, CEA, CA199, CHROMGRNA in the last 8760 hours.  Assessment and Plan: Mr. Hillmer is well-known to IR after undergoing pelvic cyst aspiration 06/23/2021 and pelvic drain placement for recurrent pelvic cyst 07/14/2021.  Patient has continued to have approximately 40 mL/day output and return of rectal discomfort he was experiencing before having drain placed.  Patient called 07/24/2021 and complained of severe pain with existing drain.  Due to persistent, severe pain patient returns today for CT abdomen pelvis and possible sclerotherapy treatment.  The Risks and benefits of embolization were discussed with the patient including, but not limited to bleeding, infection, vascular injury, post operative pain, or contrast induced renal failure.  This procedure involves the use of X-rays and because of the nature of the planned procedure, it is possible that we will have prolonged use of X-ray fluoroscopy.  Potential radiation risks to you include (but are not limited to) the following: - A slightly elevated risk for cancer several years later in life. This risk is typically less than 0.5% percent. This risk is low in comparison to the normal incidence of human cancer, which is 33% for women and 50% for men according to the [employer]. - Radiation induced injury can include skin redness, resembling a rash, tissue breakdown / ulcers and hair loss (which can be temporary or permanent).   The likelihood of either of these occurring depends on the difficulty  of the procedure and whether you are sensitive to radiation due to previous procedures, disease, or genetic conditions.   IF your procedure requires a prolonged use of radiation, you will be notified and given written instructions for further action.  It is your responsibility to monitor the irradiated area for the 2 weeks following the procedure and to notify your physician if you are concerned that you have suffered a radiation induced injury.    All of the patient's questions were answered, patient is agreeable to proceed. Consent signed and in chart.   Thank you for this interesting consult.  I greatly enjoyed  meeting [place] and look forward to participating in their care.  A copy of this report was sent to the requesting provider on this date.  Electronically Signed: Tyson Alias, NP 07/24/2021, 7:20 PM   I spent a total of 20 minutes in face to face in clinical consultation, greater than 50% of which was counseling/coordinating care for recurrent symptomatic pelvic cyst.

## 2021-07-25 ENCOUNTER — Ambulatory Visit
Admission: RE | Admit: 2021-07-25 | Discharge: 2021-07-25 | Disposition: A | Discharge status: Home / Self Care | Payer: BC Managed Care – PPO | Source: Ambulatory Visit | Attending: Radiology | Admitting: Radiology

## 2021-07-25 ENCOUNTER — Ambulatory Visit
Admission: RE | Admit: 2021-07-25 | Discharge: 2021-07-25 | Disposition: A | Discharge status: Home / Self Care | Payer: BC Managed Care – PPO | Source: Ambulatory Visit | Attending: Interventional Radiology | Admitting: Interventional Radiology

## 2021-07-25 ENCOUNTER — Other Ambulatory Visit: Payer: Self-pay | Admitting: Radiology

## 2021-07-25 ENCOUNTER — Ambulatory Visit: Payer: BC Managed Care – PPO

## 2021-07-25 ENCOUNTER — Other Ambulatory Visit: Payer: Self-pay | Admitting: Internal Medicine

## 2021-07-25 ENCOUNTER — Other Ambulatory Visit: Payer: Self-pay | Admitting: Interventional Radiology

## 2021-07-25 ENCOUNTER — Telehealth: Payer: Self-pay

## 2021-07-25 DIAGNOSIS — Z9079 Acquired absence of other genital organ(s): Secondary | ICD-10-CM | POA: Diagnosis not present

## 2021-07-25 DIAGNOSIS — R188 Other ascites: Secondary | ICD-10-CM

## 2021-07-25 DIAGNOSIS — R102 Pelvic and perineal pain: Secondary | ICD-10-CM | POA: Insufficient documentation

## 2021-07-25 DIAGNOSIS — Z4682 Encounter for fitting and adjustment of non-vascular catheter: Secondary | ICD-10-CM | POA: Diagnosis not present

## 2021-07-25 HISTORY — PX: IR SINUS/FIST TUBE CHK-NON GI: IMG673

## 2021-07-25 MED ORDER — OXYCODONE-ACETAMINOPHEN 5-325 MG PO TABS: 1.0000 | ORAL_TABLET | ORAL | 0 refills | Status: DC | PRN

## 2021-07-25 MED ORDER — IOHEXOL 350 MG/ML SOLN
15.0000 mL | Freq: Once | INTRAVENOUS | Status: AC | PRN
  Administered 2021-07-25: 15 mL

## 2021-07-25 NOTE — Telephone Encounter (Signed)
Contacted patient to check on symptoms after procedure attempt in IR this morning. Patient states that he is in a great deal of pain, unable to get under control with OTC medication. Please advise on follow up for patient's next appointment

## 2021-07-28 ENCOUNTER — Other Ambulatory Visit: Payer: Self-pay

## 2021-07-28 ENCOUNTER — Ambulatory Visit
Admission: RE | Admit: 2021-07-28 | Discharge: 2021-07-28 | Disposition: A | Discharge status: Home / Self Care | Payer: BC Managed Care – PPO | Source: Ambulatory Visit | Attending: Radiation Oncology | Admitting: Radiation Oncology

## 2021-07-28 ENCOUNTER — Inpatient Hospital Stay: Payer: BC Managed Care – PPO

## 2021-07-28 DIAGNOSIS — Z510 Encounter for antineoplastic radiation therapy: Secondary | ICD-10-CM | POA: Diagnosis not present

## 2021-07-28 DIAGNOSIS — C61 Malignant neoplasm of prostate: Secondary | ICD-10-CM | POA: Diagnosis not present

## 2021-07-28 LAB — RAD ONC ARIA SESSION SUMMARY
Course Elapsed Days: 49
Plan Fractions Treated to Date: 3
Plan Prescribed Dose Per Fraction: 2 Gy
Plan Total Fractions Prescribed: 10
Plan Total Prescribed Dose: 20 Gy
Reference Point Dosage Given to Date: 62 Gy
Reference Point Session Dosage Given: 2 Gy
Session Number: 31

## 2021-07-28 LAB — CBC
HCT: 29.7 % — ABNORMAL LOW (ref 39.0–52.0)
Hemoglobin: 10 g/dL — ABNORMAL LOW (ref 13.0–17.0)
MCH: 30.3 pg (ref 26.0–34.0)
MCHC: 33.7 g/dL (ref 30.0–36.0)
MCV: 90 fL (ref 80.0–100.0)
Platelets: 358 10*3/uL (ref 150–400)
RBC: 3.3 MIL/uL — ABNORMAL LOW (ref 4.22–5.81)
RDW: 12.9 % (ref 11.5–15.5)
WBC: 8.5 10*3/uL (ref 4.0–10.5)
nRBC: 0 % (ref 0.0–0.2)

## 2021-07-29 ENCOUNTER — Other Ambulatory Visit: Payer: Self-pay

## 2021-07-29 ENCOUNTER — Ambulatory Visit
Admission: RE | Admit: 2021-07-29 | Discharge: 2021-07-29 | Disposition: A | Discharge status: Home / Self Care | Payer: BC Managed Care – PPO | Source: Ambulatory Visit | Attending: Radiation Oncology | Admitting: Radiation Oncology

## 2021-07-29 DIAGNOSIS — C61 Malignant neoplasm of prostate: Secondary | ICD-10-CM | POA: Diagnosis not present

## 2021-07-29 DIAGNOSIS — Z510 Encounter for antineoplastic radiation therapy: Secondary | ICD-10-CM | POA: Diagnosis not present

## 2021-07-29 LAB — RAD ONC ARIA SESSION SUMMARY
Course Elapsed Days: 50
Plan Fractions Treated to Date: 4
Plan Prescribed Dose Per Fraction: 2 Gy
Plan Total Fractions Prescribed: 10
Plan Total Prescribed Dose: 20 Gy
Reference Point Dosage Given to Date: 64 Gy
Reference Point Session Dosage Given: 2 Gy
Session Number: 32

## 2021-07-29 NOTE — Telephone Encounter (Signed)
Called in to check in with the patient.  In summary, he returned to interventional radiology this past friday Friday, additional collections adjacent to the current drain.  Continues to have drain site pain.  Ultimately, elected not to pursue sclerosis for multiple concerns.  Discussed whether or not to remove the drain, interventional radiologist elected not to remove the drain due to concern for prompt reaccumulation.  Plan to have Dale Gray come in tomorrow for reassessment.  Has been keeping drain outputs are seem to be slowing.  On telephone today, he indicated that his perirectal pain and pressure have recurred despite the current drain in place.  May consider just removing the drain tomorrow and if pain and pressure worsens, replacement of the drain in a different location down the road.  In the interim, we have reached out to [name] multidisciplinary clinic given his very complex patient clinical situation.  They indicated they will likely be able to get him in in 1 to 2 weeks.  [name], MD

## 2021-07-30 ENCOUNTER — Ambulatory Visit
Admission: RE | Admit: 2021-07-30 | Discharge: 2021-07-30 | Disposition: A | Discharge status: Home / Self Care | Payer: BC Managed Care – PPO | Source: Ambulatory Visit | Attending: Radiology | Admitting: Radiology

## 2021-07-30 ENCOUNTER — Ambulatory Visit: Payer: BC Managed Care – PPO

## 2021-07-30 ENCOUNTER — Ambulatory Visit
Admission: RE | Admit: 2021-07-30 | Discharge: 2021-07-30 | Disposition: A | Discharge status: Home / Self Care | Payer: BC Managed Care – PPO | Source: Ambulatory Visit | Attending: Radiation Oncology | Admitting: Radiation Oncology

## 2021-07-30 ENCOUNTER — Other Ambulatory Visit: Payer: Self-pay

## 2021-07-30 ENCOUNTER — Ambulatory Visit (INDEPENDENT_AMBULATORY_CARE_PROVIDER_SITE_OTHER): Payer: BC Managed Care – PPO | Admitting: Urology

## 2021-07-30 VITALS — BP 125/80 | HR 60

## 2021-07-30 DIAGNOSIS — Z510 Encounter for antineoplastic radiation therapy: Secondary | ICD-10-CM | POA: Diagnosis not present

## 2021-07-30 DIAGNOSIS — Z8546 Personal history of malignant neoplasm of prostate: Secondary | ICD-10-CM | POA: Diagnosis not present

## 2021-07-30 DIAGNOSIS — C61 Malignant neoplasm of prostate: Secondary | ICD-10-CM | POA: Diagnosis not present

## 2021-07-30 DIAGNOSIS — R188 Other ascites: Secondary | ICD-10-CM

## 2021-07-30 LAB — RAD ONC ARIA SESSION SUMMARY
Course Elapsed Days: 51
Plan Fractions Treated to Date: 5
Plan Prescribed Dose Per Fraction: 2 Gy
Plan Total Fractions Prescribed: 10
Plan Total Prescribed Dose: 20 Gy
Reference Point Dosage Given to Date: 66 Gy
Reference Point Session Dosage Given: 2 Gy
Session Number: 33

## 2021-07-30 NOTE — Progress Notes (Signed)
07/30/21 3:10 PM   Dale Gray January 31, 1956 [id number]  Referring provider:  Jerrol Banana., MD [address] [place],  [place] [address] Chief Complaint  Patient presents with   Prostate Cancer    HPI: Dale Gray is a 66 y.o.male with a personal history of unfavorable intermediate prostate cancer, stress urinary incontinence , and erectile dysfunction after prostatectomy returning today with discussed re: prostate cancer plan and pelvic fluid collection management.    He was initially diagnosed with low risk prostate cancer in 2020.  At the time, he was noted to have massively enlarged abnormal dilated seminal vesicles, left greater than right as well as abnormal rectal exam in the left side.  He was followed on surveillance for period of time including serial MRIs.  Ultimately, due to concern for fluctuating PSA and persistently abnormal rectal exam, he underwent repeat biopsy in 2022 upstaging him to intermediate risk.  Preoperative staging indicated perirectal mass initially the time felt to be possibly biopsy related.  He underwent colonoscopy for further evaluation of this.  This was negative.  He had serial imaging as well as a period of time in order for this possible hematoma and/or collection to resolve but failed to do so so ultimately he move forward with surgery.  He s/p DaVinci laparoscopic radical prostatectomy and bilateral pelvic lymph node dissection on 12/02/2020. Surgical pathology was negative for malignancy aside from prostate that resulted in invasive prostatic ductal adenocarcinoma. Negative LN, BN, ECE however positive margin at left base which was intraoperative technically difficult and setting a massive vas dilation proximity to rectum as well as absence of posterior plane.   02/18/2021 PET scan visualize no evidence of local prostate cancer reoccurrence and prostatectomy bed, metastatic adenopathy and no visceral metastasis or skeletal  metastasis.    He is undergoing Salvage IMRT and oral orgavix he has recurrent pelvic fluid massive effect on rectum he s/p percutaneous drainage.  He had a fairly quick recurrence of this and now has a drain in place. Cytology was negative differentials could include lymphocele, malignancy related, seminal vesicle remnant, etc.  Notably, this is the same location of his preoperative perirectal collection.  Unfortunately, he is not tolerating the drain particularly well.  He has noted significant decrease in drain output over the last several days, only 5 to 10 cc/day.  Fluid is thin.  He has been on Orgovyx consistently for 2 months has had a small gap in treatment over the past 3 weeks as the medication needed to be approved by his insurance and hopefully should be arriving later this week.   PSA trend:   Prostate Specific Ag, Serum  Latest Ref Rng 0.0 - 4.0 ng/mL  03/10/2018 6.5 (H)   09/12/2018 7.3 (H)   10/25/2018 7.3 (H)   04/04/2019 9.1 (H)   07/31/2019 9.2 (H)   12/06/2019 5.0 (H)   05/10/2020 6.1 (H)   06/11/2020 9.3 (H)   12/30/2020 2.6   01/24/2021 2.2   03/13/2021 2.5   05/12/2021 2.9   07/14/2021 7.4 (H)     (H) High    PMH: Past Medical History:  Diagnosis Date   Complication of anesthesia    hard time waking up   GERD (gastroesophageal reflux disease)    Hemorrhoid    Hyperlipidemia    Joint pain    PONV (postoperative nausea and vomiting)    after 1st ear surgery   Prostate cancer ([place])    Reflux  Surgical History: Past Surgical History:  Procedure Laterality Date   ANAL FISSURE REPAIR  2014   CARPAL TUNNEL RELEASE Right    and elbow surgery   COLONOSCOPY WITH PROPOFOL N/A 03/19/2017   Procedure: COLONOSCOPY WITH PROPOFOL;  Surgeon: Virgel Manifold, MD;  Location: [facility] ENDOSCOPY;  Service: Endoscopy;  Laterality: N/A;   COLONOSCOPY WITH PROPOFOL N/A 08/20/2020   Procedure: COLONOSCOPY WITH PROPOFOL;  Surgeon: Lucilla Lame, MD;  Location: [facility] ENDOSCOPY;   Service: Endoscopy;  Laterality: N/A;   ESOPHAGOGASTRODUODENOSCOPY (EGD) WITH PROPOFOL N/A 08/20/2020   Procedure: ESOPHAGOGASTRODUODENOSCOPY (EGD) WITH PROPOFOL;  Surgeon: Lucilla Lame, MD;  Location: [facility] ENDOSCOPY;  Service: Endoscopy;  Laterality: N/A;   IR RADIOLOGIST EVAL & MGMT  07/21/2021   IR SINUS/FIST TUBE CHK-NON GI  07/25/2021   PELVIC LYMPH NODE DISSECTION Bilateral 12/02/2020   Procedure: PELVIC LYMPH NODE DISSECTION;  Surgeon: [name], MD;  Location: [facility] ORS;  Service: Urology;  Laterality: Bilateral;   ROBOT ASSISTED LAPAROSCOPIC RADICAL PROSTATECTOMY N/A 12/02/2020   Procedure: XI ROBOTIC ASSISTED LAPAROSCOPIC RADICAL PROSTATECTOMY;  Surgeon: [name], MD;  Location: [facility] ORS;  Service: Urology;  Laterality: N/A;   TYMPANOPLASTY Left 04/30/2014   TYMPANOPLASTY  10/2014   VASECTOMY  01/06/2002    Home Medications:  Allergies as of 07/30/2021       Reactions   Lovastatin    Sleep disturbance and extremity pains        Medication List        Accurate as of Jul 30, 2021 11:59 PM. If you have any questions, ask your nurse or doctor.          aspirin 81 MG tablet Take 81 mg by mouth daily.   azelastine 0.1 % nasal spray Commonly known as: ASTELIN Place 1 spray into both nostrils 2 (two) times daily. Use in each nostril as directed   cetirizine 10 MG tablet Commonly known as: ZYRTEC Take 1 tablet (10 mg total) by mouth daily.   ezetimibe 10 MG tablet Commonly known as: ZETIA TAKE 1 TABLET DAILY   fish oil-omega-3 fatty acids 1000 MG capsule Take 1 g by mouth daily.   fluticasone 50 MCG/ACT nasal spray Commonly known as: FLONASE Place 2 sprays into both nostrils daily.   hydrocortisone 2.5 % rectal cream Commonly known as: Anusol-HC Place 1 application. rectally 2 (two) times daily.   loratadine 10 MG tablet Commonly known as: CLARITIN Take 1 tablet (10 mg total) by mouth daily.   multivitamin with minerals tablet Take 1 tablet by  mouth daily.   omeprazole 40 MG capsule Commonly known as: PRILOSEC Take 1 capsule (40 mg total) by mouth daily.   Orgovyx 120 MG Tabs Generic drug: Relugolix Take 120 mg by mouth daily.   oxyCODONE-acetaminophen 5-325 MG tablet Commonly known as: Percocet Take 1-2 tablets by mouth every 4 (four) hours as needed for moderate pain or severe pain.   predniSONE 20 MG tablet Commonly known as: DELTASONE Take 1 tablet (20 mg total) by mouth daily with breakfast.   pregabalin 50 MG capsule Commonly known as: LYRICA Take 1 capsule (50 mg total) by mouth 3 (three) times daily.   tadalafil 5 MG tablet Commonly known as: CIALIS Take 1 tablet (5 mg total) by mouth daily as needed for erectile dysfunction.   zolpidem 12.5 MG CR tablet Commonly known as: AMBIEN CR TAKE 1 TABLET(12.5 MG) BY MOUTH AT BEDTIME AS NEEDED        Allergies:  Allergies  Allergen Reactions  Lovastatin     Sleep disturbance and extremity pains    Family History: Family History  Problem Relation Age of Onset   Heart disease Mother    Asthma Mother    Heart disease Father    CAD Father    Healthy Sister    Healthy Brother    Healthy Daughter    Healthy Daughter    Diabetes Maternal Aunt    Diabetes Maternal Uncle    Colon cancer Neg Hx     Social History:  reports that he has never smoked. He has never used smokeless tobacco. He reports that he does not drink alcohol and does not use drugs.   Physical Exam: BP 125/80   Pulse 60   Constitutional:  Alert and oriented, No acute distress.  Accompanied today by his wife and 2 daughters. HEENT: [place] AT, moist mucus membranes.  Trachea midline, no masses. Cardiovascular: No clubbing, cyanosis, or edema. Respiratory: Normal respiratory effort, no increased work of breathing. Skin: No rashes, bruises or suspicious lesions. Neurologic: Grossly intact, no focal deficits, moving all 4 extremities. Psychiatric: Normal mood and affect.  Laboratory  Data:  Lab Results  Component Value Date   CREATININE 1.10 07/10/2021   Lab Results  Component Value Date   HGBA1C 5.6 05/14/2021    Multiple previous imaging studies were reviewed today personally and in the collaboration with Dr. Denna Haggard (IR).  Historical images dating back to preop were also reviewed and trying to decipher the etiology of his current pelvic fluid collection.   Assessment & Plan:    1.  Prostate cancer- Nonmetastatic locally advanced with biochemical persistent ductal carcinoma  Status postsurgical resection with 4 additional treatments of salvage radiation to complete on ADT in the form of Orgovyx   There is a concern that the perirectal mass in question (present preop) and the current fluid collection are related and may be malignant in nature despite negative cytology.  At this point, given the complexity of this case, I recommended a second opinion at academic [facility].  He is agreed to be seen by [facility] multidisciplinary clinic.  I am currently unaware of any local studies for ductal carcinoma however there may be additional resources available at academic center.  Both he and his family are agreeable this plan.  2.  Pelvic fluid collection- Drain output has decreased significantly thus discussed with interventional radiology for possible drain removal Friday.  He understands the risk for recurrence.    Conley Rolls as a Education administrator for [name], MD.,have documented all relevant documentation on the behalf of [name], MD,as directed by  [name], MD while in the presence of [name], MD.  I have reviewed the above documentation for accuracy and completeness, and I agree with the above.   [name], MD   [facility] [address], [place], [place] [address] [phone]  I spent 50 total minutes on the day of the encounter including pre-visit review of the medical record,  face-to-face time with the patient, and post visit ordering of labs/imaging/tests.  Jorde of this time was spent reviewing records and discussing this patient with interventional radiology personally.

## 2021-07-31 ENCOUNTER — Ambulatory Visit
Admission: RE | Admit: 2021-07-31 | Discharge: 2021-07-31 | Disposition: A | Discharge status: Home / Self Care | Payer: BC Managed Care – PPO | Source: Ambulatory Visit | Attending: Radiation Oncology | Admitting: Radiation Oncology

## 2021-07-31 ENCOUNTER — Other Ambulatory Visit: Payer: Self-pay

## 2021-07-31 DIAGNOSIS — C61 Malignant neoplasm of prostate: Secondary | ICD-10-CM | POA: Diagnosis not present

## 2021-07-31 DIAGNOSIS — Z510 Encounter for antineoplastic radiation therapy: Secondary | ICD-10-CM | POA: Diagnosis not present

## 2021-07-31 LAB — RAD ONC ARIA SESSION SUMMARY
Course Elapsed Days: 52
Plan Fractions Treated to Date: 6
Plan Prescribed Dose Per Fraction: 2 Gy
Plan Total Fractions Prescribed: 10
Plan Total Prescribed Dose: 20 Gy
Reference Point Dosage Given to Date: 68 Gy
Reference Point Session Dosage Given: 2 Gy
Session Number: 34

## 2021-07-31 LAB — PSA: Prostate Specific Ag, Serum: 1 ng/mL (ref 0.0–4.0)

## 2021-07-31 MED ORDER — OXYCODONE-ACETAMINOPHEN 5-325 MG PO TABS: 1.0000 | ORAL_TABLET | ORAL | 0 refills | Status: DC | PRN

## 2021-08-01 ENCOUNTER — Ambulatory Visit
Admission: RE | Admit: 2021-08-01 | Discharge: 2021-08-01 | Disposition: A | Discharge status: Home / Self Care | Payer: BC Managed Care – PPO | Source: Ambulatory Visit | Attending: Radiation Oncology | Admitting: Radiation Oncology

## 2021-08-01 ENCOUNTER — Other Ambulatory Visit: Payer: Self-pay

## 2021-08-01 ENCOUNTER — Ambulatory Visit
Admission: RE | Admit: 2021-08-01 | Discharge: 2021-08-01 | Disposition: A | Discharge status: Home / Self Care | Payer: BC Managed Care – PPO | Source: Ambulatory Visit | Attending: Interventional Radiology | Admitting: Interventional Radiology

## 2021-08-01 DIAGNOSIS — Z4682 Encounter for fitting and adjustment of non-vascular catheter: Secondary | ICD-10-CM | POA: Diagnosis not present

## 2021-08-01 DIAGNOSIS — Z510 Encounter for antineoplastic radiation therapy: Secondary | ICD-10-CM | POA: Diagnosis not present

## 2021-08-01 DIAGNOSIS — R188 Other ascites: Secondary | ICD-10-CM | POA: Insufficient documentation

## 2021-08-01 DIAGNOSIS — C61 Malignant neoplasm of prostate: Secondary | ICD-10-CM | POA: Diagnosis not present

## 2021-08-01 HISTORY — PX: IR SINUS/FIST TUBE CHK-NON GI: IMG673

## 2021-08-01 LAB — RAD ONC ARIA SESSION SUMMARY
Course Elapsed Days: 53
Plan Fractions Treated to Date: 7
Plan Prescribed Dose Per Fraction: 2 Gy
Plan Total Fractions Prescribed: 10
Plan Total Prescribed Dose: 20 Gy
Reference Point Dosage Given to Date: 70 Gy
Reference Point Session Dosage Given: 2 Gy
Session Number: 35

## 2021-08-01 MED ORDER — IOHEXOL 350 MG/ML SOLN
10.0000 mL | Freq: Once | INTRAVENOUS | Status: AC | PRN
  Administered 2021-08-01: 2 mL

## 2021-08-05 ENCOUNTER — Ambulatory Visit: Payer: BC Managed Care – PPO

## 2021-08-05 ENCOUNTER — Ambulatory Visit
Admission: RE | Admit: 2021-08-05 | Discharge: 2021-08-05 | Disposition: A | Discharge status: Home / Self Care | Payer: BC Managed Care – PPO | Source: Ambulatory Visit | Attending: Radiation Oncology | Admitting: Radiation Oncology

## 2021-08-05 ENCOUNTER — Other Ambulatory Visit: Payer: Self-pay

## 2021-08-05 DIAGNOSIS — C61 Malignant neoplasm of prostate: Secondary | ICD-10-CM | POA: Diagnosis not present

## 2021-08-05 DIAGNOSIS — Z510 Encounter for antineoplastic radiation therapy: Secondary | ICD-10-CM | POA: Diagnosis not present

## 2021-08-05 LAB — RAD ONC ARIA SESSION SUMMARY
Course Elapsed Days: 57
Plan Fractions Treated to Date: 8
Plan Prescribed Dose Per Fraction: 2 Gy
Plan Total Fractions Prescribed: 10
Plan Total Prescribed Dose: 20 Gy
Reference Point Dosage Given to Date: 72 Gy
Reference Point Session Dosage Given: 2 Gy
Session Number: 36

## 2021-08-06 ENCOUNTER — Ambulatory Visit: Payer: BC Managed Care – PPO

## 2021-08-06 ENCOUNTER — Ambulatory Visit
Admission: RE | Admit: 2021-08-06 | Discharge: 2021-08-06 | Disposition: A | Discharge status: Home / Self Care | Payer: BC Managed Care – PPO | Source: Ambulatory Visit | Attending: Radiation Oncology | Admitting: Radiation Oncology

## 2021-08-06 ENCOUNTER — Telehealth: Payer: Self-pay | Admitting: *Deleted

## 2021-08-06 ENCOUNTER — Other Ambulatory Visit: Payer: Self-pay

## 2021-08-06 DIAGNOSIS — C61 Malignant neoplasm of prostate: Secondary | ICD-10-CM | POA: Diagnosis not present

## 2021-08-06 DIAGNOSIS — Z510 Encounter for antineoplastic radiation therapy: Secondary | ICD-10-CM | POA: Diagnosis not present

## 2021-08-06 LAB — RAD ONC ARIA SESSION SUMMARY
Course Elapsed Days: 58
Plan Fractions Treated to Date: 9
Plan Prescribed Dose Per Fraction: 2 Gy
Plan Total Fractions Prescribed: 10
Plan Total Prescribed Dose: 20 Gy
Reference Point Dosage Given to Date: 74 Gy
Reference Point Session Dosage Given: 2 Gy
Session Number: 37

## 2021-08-06 NOTE — Telephone Encounter (Signed)
Spoke with patient regarding Orgovyx medications. He received a call from [employer] will receive shipment today or tomorrow. Aware to let us know if he does not receive his medications. He reports some increasing pain, he has been alternating with Advil and Tylenol and PRN oxycodone. Informed per Dr. [name] to let us know if his pain is worsening. Voiced understanding.

## 2021-08-07 ENCOUNTER — Ambulatory Visit: Payer: BC Managed Care – PPO | Admitting: Radiology

## 2021-08-07 ENCOUNTER — Other Ambulatory Visit: Payer: Self-pay

## 2021-08-07 ENCOUNTER — Ambulatory Visit
Admission: RE | Admit: 2021-08-07 | Discharge: 2021-08-07 | Disposition: A | Discharge status: Home / Self Care | Payer: BC Managed Care – PPO | Source: Ambulatory Visit | Attending: Radiation Oncology | Admitting: Radiation Oncology

## 2021-08-07 DIAGNOSIS — C61 Malignant neoplasm of prostate: Secondary | ICD-10-CM | POA: Diagnosis not present

## 2021-08-07 DIAGNOSIS — N2889 Other specified disorders of kidney and ureter: Secondary | ICD-10-CM | POA: Diagnosis not present

## 2021-08-07 DIAGNOSIS — Z510 Encounter for antineoplastic radiation therapy: Secondary | ICD-10-CM | POA: Diagnosis not present

## 2021-08-07 LAB — RAD ONC ARIA SESSION SUMMARY
Course Elapsed Days: 59
Plan Fractions Treated to Date: 10
Plan Prescribed Dose Per Fraction: 2 Gy
Plan Total Fractions Prescribed: 10
Plan Total Prescribed Dose: 20 Gy
Reference Point Dosage Given to Date: 76 Gy
Reference Point Session Dosage Given: 2 Gy
Session Number: 38

## 2021-08-12 ENCOUNTER — Telehealth: Payer: Self-pay | Admitting: *Deleted

## 2021-08-12 DIAGNOSIS — R188 Other ascites: Secondary | ICD-10-CM

## 2021-08-12 DIAGNOSIS — Z9079 Acquired absence of other genital organ(s): Secondary | ICD-10-CM | POA: Diagnosis not present

## 2021-08-12 DIAGNOSIS — Z191 Hormone sensitive malignancy status: Secondary | ICD-10-CM | POA: Diagnosis not present

## 2021-08-12 DIAGNOSIS — Z7982 Long term (current) use of aspirin: Secondary | ICD-10-CM | POA: Diagnosis not present

## 2021-08-12 DIAGNOSIS — G8929 Other chronic pain: Secondary | ICD-10-CM | POA: Diagnosis not present

## 2021-08-12 DIAGNOSIS — I898 Other specified noninfective disorders of lymphatic vessels and lymph nodes: Secondary | ICD-10-CM | POA: Diagnosis not present

## 2021-08-12 DIAGNOSIS — C61 Malignant neoplasm of prostate: Secondary | ICD-10-CM | POA: Diagnosis not present

## 2021-08-12 DIAGNOSIS — Z923 Personal history of irradiation: Secondary | ICD-10-CM | POA: Diagnosis not present

## 2021-08-12 DIAGNOSIS — R9721 Rising PSA following treatment for malignant neoplasm of prostate: Secondary | ICD-10-CM | POA: Diagnosis not present

## 2021-08-12 DIAGNOSIS — E785 Hyperlipidemia, unspecified: Secondary | ICD-10-CM | POA: Diagnosis not present

## 2021-08-12 DIAGNOSIS — Z79899 Other long term (current) drug therapy: Secondary | ICD-10-CM | POA: Diagnosis not present

## 2021-08-12 DIAGNOSIS — K6289 Other specified diseases of anus and rectum: Secondary | ICD-10-CM | POA: Diagnosis not present

## 2021-08-12 DIAGNOSIS — Z8042 Family history of malignant neoplasm of prostate: Secondary | ICD-10-CM | POA: Diagnosis not present

## 2021-08-12 DIAGNOSIS — R972 Elevated prostate specific antigen [PSA]: Secondary | ICD-10-CM | POA: Diagnosis not present

## 2021-08-12 NOTE — Telephone Encounter (Signed)
Spoke with Dale Gray regarding Dale Gray urology appointment scheduled 08/21/21 and his ongoing pain due to fluid.  Per Dr. [name] agrees with plan for appt 08/21/21 and recommends drain placement due to his increasing pain. Dale Gray prefers drain placement has been a lot of pain. Dale Gray has been alternating with percocet and motrin. Aware referral will be placed. Voiced understanding.

## 2021-08-13 ENCOUNTER — Other Ambulatory Visit: Payer: Self-pay | Admitting: Radiology

## 2021-08-13 DIAGNOSIS — R188 Other ascites: Secondary | ICD-10-CM

## 2021-08-13 NOTE — Telephone Encounter (Signed)
Per IR patient is scheduled for drain placement tomorrow 6/8 at 11a with an arrival time of 10a. Patient notified and confirmed

## 2021-08-13 NOTE — Addendum Note (Signed)
Addended by: Tommy Rainwater on: 08/13/2021 11:57 AM   Modules accepted: Orders

## 2021-08-14 ENCOUNTER — Other Ambulatory Visit: Payer: Self-pay | Admitting: Interventional Radiology

## 2021-08-14 ENCOUNTER — Ambulatory Visit
Admission: RE | Admit: 2021-08-14 | Discharge: 2021-08-14 | Disposition: A | Discharge status: Home / Self Care | Payer: BC Managed Care – PPO | Source: Ambulatory Visit | Attending: Interventional Radiology | Admitting: Interventional Radiology

## 2021-08-14 ENCOUNTER — Other Ambulatory Visit: Payer: Self-pay

## 2021-08-14 ENCOUNTER — Ambulatory Visit
Admission: RE | Admit: 2021-08-14 | Discharge: 2021-08-14 | Disposition: A | Discharge status: Home / Self Care | Payer: BC Managed Care – PPO | Source: Ambulatory Visit | Attending: Urology | Admitting: Urology

## 2021-08-14 DIAGNOSIS — R933 Abnormal findings on diagnostic imaging of other parts of digestive tract: Secondary | ICD-10-CM | POA: Diagnosis not present

## 2021-08-14 DIAGNOSIS — R188 Other ascites: Secondary | ICD-10-CM | POA: Insufficient documentation

## 2021-08-14 DIAGNOSIS — Z9079 Acquired absence of other genital organ(s): Secondary | ICD-10-CM | POA: Diagnosis not present

## 2021-08-14 DIAGNOSIS — Z8546 Personal history of malignant neoplasm of prostate: Secondary | ICD-10-CM | POA: Diagnosis not present

## 2021-08-14 DIAGNOSIS — K651 Peritoneal abscess: Secondary | ICD-10-CM | POA: Diagnosis not present

## 2021-08-14 HISTORY — DX: Other seasonal allergic rhinitis: J30.2

## 2021-08-14 LAB — CBC
HCT: 31.6 % — ABNORMAL LOW (ref 39.0–52.0)
Hemoglobin: 10.4 g/dL — ABNORMAL LOW (ref 13.0–17.0)
MCH: 29.2 pg (ref 26.0–34.0)
MCHC: 32.9 g/dL (ref 30.0–36.0)
MCV: 88.8 fL (ref 80.0–100.0)
Platelets: 443 10*3/uL — ABNORMAL HIGH (ref 150–400)
RBC: 3.56 MIL/uL — ABNORMAL LOW (ref 4.22–5.81)
RDW: 13.2 % (ref 11.5–15.5)
WBC: 8.2 10*3/uL (ref 4.0–10.5)
nRBC: 0 % (ref 0.0–0.2)

## 2021-08-14 LAB — PROTIME-INR
INR: 1 (ref 0.8–1.2)
Prothrombin Time: 13.2 seconds (ref 11.4–15.2)

## 2021-08-14 MED ORDER — FENTANYL CITRATE (PF) 100 MCG/2ML IJ SOLN
INTRAMUSCULAR | Status: AC | PRN
  Administered 2021-08-14 (×2): 50 ug via INTRAVENOUS

## 2021-08-14 MED ORDER — IOHEXOL 300 MG/ML  SOLN
100.0000 mL | Freq: Once | INTRAMUSCULAR | Status: AC | PRN
  Administered 2021-08-14: 100 mL via INTRAVENOUS

## 2021-08-14 MED ORDER — FENTANYL CITRATE (PF) 100 MCG/2ML IJ SOLN
INTRAMUSCULAR | Status: AC
  Filled 2021-08-14: qty 2

## 2021-08-14 MED ORDER — MIDAZOLAM HCL 5 MG/5ML IJ SOLN
INTRAMUSCULAR | Status: AC | PRN
  Administered 2021-08-14 (×2): 1 mg via INTRAVENOUS

## 2021-08-14 MED ORDER — MIDAZOLAM HCL 2 MG/2ML IJ SOLN
INTRAMUSCULAR | Status: AC
  Filled 2021-08-14: qty 2

## 2021-08-14 MED ORDER — SODIUM CHLORIDE 0.9 % IV SOLN: INTRAVENOUS | Status: DC

## 2021-08-14 NOTE — H&P (Signed)
Chief Complaint: Patient was seen in consultation today for recurrent symptomatic pelvic fluid collection at the request of [place]  Referring Physician(s): [name]  Supervising Physician: Michaelle Birks  Patient Status: [facility] - Out-pt  History of Present Illness: Dale Gray is a 66 y.o. male with pertinent PMHx significant for prostate cancer s/p radical prostatectomy and pelvic lymph node dissection on 12/02/20 with post operative pelvic fluid collection. He has underwent previous aspiration on 4/17 yielding 430m with cytology negative for atypia or malignancy, 5/8 drainage catheter placed and continued until 5/26 when the output remained low and drain was removed. The patient complains of recurrent symptoms that started 1 week from drain removal and repeat CT imaging done today 6/8 reveals recurrence of pelvic fluid collection. He presents today for image guided aspiration versus drain placement. He complains of rectal pain today and denies any other complaints. He states he has finished his radiation treatments. The patient denies any current chest pain or shortness of breath. The patient denies any recent infections, fever or chills. He has no known complications to sedation.    Past Medical History:  Diagnosis Date   Complication of anesthesia    hard time waking up   GERD (gastroesophageal reflux disease)    Hemorrhoid    Hyperlipidemia    Joint pain    PONV (postoperative nausea and vomiting)    after 1st ear surgery   [place]    Reflux    Seasonal allergies     Past Surgical History:  Procedure Laterality Date   ANAL FISSURE REPAIR  2014   CARPAL TUNNEL RELEASE Right    and elbow surgery   COLONOSCOPY WITH PROPOFOL N/A 03/19/2017   Procedure: COLONOSCOPY WITH PROPOFOL;  Surgeon: TVirgel Manifold MD;  Location: [facility] ENDOSCOPY;  Service: Endoscopy;  Laterality: N/A;   COLONOSCOPY WITH PROPOFOL N/A 08/20/2020   Procedure:  COLONOSCOPY WITH PROPOFOL;  Surgeon: WLucilla Lame MD;  [facility];  Service: Endoscopy;  Laterality: N/A;   ESOPHAGOGASTRODUODENOSCOPY (EGD) WITH PROPOFOL N/A 08/20/2020   Procedure: ESOPHAGOGASTRODUODENOSCOPY (EGD) WITH PROPOFOL;  Surgeon: WLucilla Lame MD;  Location: [facility] ENDOSCOPY;  Service: Endoscopy;  Laterality: N/A;   IR RADIOLOGIST EVAL & MGMT  07/21/2021   IR SINUS/FIST TUBE CHK-NON GI  07/25/2021   IR SINUS/FIST TUBE CHK-NON GI  08/01/2021   PELVIC LYMPH NODE DISSECTION Bilateral 12/02/2020   Procedure: PELVIC LYMPH NODE DISSECTION;  Surgeon: BHollice Espy MD;  Location: [facility] ORS;  Service: Urology;  Laterality: Bilateral;   ROBOT ASSISTED LAPAROSCOPIC RADICAL PROSTATECTOMY N/A 12/02/2020   Procedure: XI ROBOTIC ASSISTED LAPAROSCOPIC RADICAL PROSTATECTOMY;  Surgeon: BHollice Espy MD;  Location: [facility] ORS;  Service: Urology;  Laterality: N/A;   TYMPANOPLASTY Left 04/30/2014   TYMPANOPLASTY  10/2014   VASECTOMY  01/06/2002    Allergies: Lovastatin  Medications: Prior to Admission medications   Medication Sig Start Date End Date Taking? Authorizing Provider  acetaminophen (TYLENOL) 500 MG tablet Take 1,000 mg by mouth every 4 (four) hours as needed for mild pain.   Yes [provider]  aspirin 81 MG tablet Take 81 mg by mouth daily.   Yes [provider]  cetirizine (ZYRTEC) 10 MG tablet Take 1 tablet (10 mg total) by mouth daily. 07/15/21  Yes GJerrol Banana, MD  ezetimibe (ZETIA) 10 MG tablet TAKE 1 TABLET DAILY 06/16/21  Yes FBirdie Sons MD  fish oil-omega-3 fatty acids 1000 MG capsule Take 1 g by mouth daily.   Yes  [provider]  fluticasone (FLONASE) 50 MCG/ACT nasal spray Place 2 sprays into both nostrils daily. 07/15/21  Yes Jerrol Banana., MD  ibuprofen (ADVIL) 100 MG tablet Take 1,000 mg by mouth every 4 (four) hours as needed for fever.   Yes [provider]  loratadine (CLARITIN) 10 MG tablet Take 1  tablet (10 mg total) by mouth daily. 05/14/21  Yes [name], PA-C  Multiple Vitamins-Minerals (MULTIVITAMIN WITH MINERALS) tablet Take 1 tablet by mouth daily.   Yes [provider]  omeprazole (PRILOSEC) 40 MG capsule Take 1 capsule (40 mg total) by mouth daily. 03/13/21  Yes Birdie Sons, MD  oxycodone-acetaminophen (PERCOCET) 2.5-325 MG tablet Take 1 tablet by mouth every 4 (four) hours as needed for pain.   Yes [provider]  pregabalin (LYRICA) 50 MG capsule Take 1 capsule (50 mg total) by mouth 3 (three) times daily. Patient taking differently: Take 50 mg by mouth 2 (two) times daily. 03/13/21  Yes Birdie Sons, MD  Relugolix (ORGOVYX) 120 MG TABS Take 120 mg by mouth daily. 07/16/21  Yes [name], MD  tadalafil (CIALIS) 5 MG tablet Take 1 tablet (5 mg total) by mouth daily as needed for erectile dysfunction. 05/15/21  Yes [name], MD  zolpidem (AMBIEN CR) 12.5 MG CR tablet TAKE 1 TABLET(12.5 MG) BY MOUTH AT BEDTIME AS NEEDED 06/20/21  Yes Jerrol Banana., MD  azelastine (ASTELIN) 0.1 % nasal spray Place 1 spray into both nostrils 2 (two) times daily. Use in each nostril as directed Patient not taking: Reported on 08/14/2021 05/14/21   Drubel, Ria Comment, PA-C  hydrocortisone (ANUSOL-HC) 2.5 % rectal cream Place 1 application. rectally 2 (two) times daily. Patient not taking: Reported on 08/14/2021 06/17/21   Noreene Filbert, MD  oxyCODONE-acetaminophen (PERCOCET) 5-325 MG tablet Take 1-2 tablets by mouth every 4 (four) hours as needed for moderate pain or severe pain. 07/31/21   [name], MD  predniSONE (DELTASONE) 20 MG tablet Take 1 tablet (20 mg total) by mouth daily with breakfast. Patient not taking: Reported on 08/14/2021 07/15/21   Jerrol Banana., MD     Family History  Problem Relation Age of Onset   Heart disease Mother    Asthma Mother    Heart disease Father    CAD Father    Healthy Sister    Healthy Brother    Healthy  Daughter    Healthy Daughter    Diabetes Maternal Aunt    Diabetes Maternal Uncle    Colon cancer Neg Hx     Social History   Socioeconomic History   Marital status: Married    Spouse name: Not on file   Number of children: Not on file   Years of education: Not on file   Highest education level: Not on file  Occupational History   Not on file  Tobacco Use   Smoking status: Never   Smokeless tobacco: Never  Vaping Use   Vaping Use: Never used  Substance and Sexual Activity   Alcohol use: No   Drug use: No   Sexual activity: Not on file  Other Topics Concern   Not on file  Social History Narrative   Not on file   Social Determinants of Health   Financial Resource Strain: Not on file  Food Insecurity: Not on file  Transportation Needs: Not on file  Physical Activity: Not on file  Stress: Not on file  Social Connections: Not on file  Review of Systems: A 12 point ROS discussed and pertinent positives are indicated in the HPI above.  All other systems are negative.  Review of Systems  Vital Signs: BP 134/75   Pulse 68   Temp 98.1 F (36.7 C) (Oral)   Resp 13   Ht '5\' 10"'$  (1.778 m)   Wt 159 lb (72.1 kg)   SpO2 100%   BMI 22.81 kg/m   Physical Exam Constitutional:      Appearance: Normal appearance.  HENT:     Head: Normocephalic and atraumatic.  Cardiovascular:     Rate and Rhythm: Normal rate and regular rhythm.  Pulmonary:     Effort: Pulmonary effort is normal. No respiratory distress.  Skin:    General: Skin is warm and dry.  Neurological:     Mental Status: He is alert and oriented to person, place, and time.     Imaging: IR Sinus/Fist Tube Chk-Non GI  Result Date: 08/01/2021 INDICATION: Recurrent pelvic fluid collection; patient reports continued low output from the drain of 5-10 mL for the last 4 days EXAM: Drain injection and removal using fluoroscopy MEDICATIONS: The patient is currently admitted to the hospital and receiving intravenous  antibiotics. The antibiotics were administered within an appropriate time frame prior to the initiation of the procedure. ANESTHESIA/SEDATION: None COMPLICATIONS: None immediate. PROCEDURE: Informed written consent was obtained from the patient after a thorough discussion of the procedural risks, benefits and alternatives. All questions were addressed. Maximal Sterile Barrier Technique was utilized including caps, mask, sterile gowns, sterile gloves, sterile drape, hand hygiene and skin antiseptic. A timeout was performed prior to the initiation of the procedure. Patient was placed prone on the exam table. The pelvic drainage catheter was injected under fluoroscopy and studied. Injection shows decompressed cavity. Given reported low output, and the patient's general discomfort from the drain, the decision was made to remove the catheter placement. The locking loop was released. Over an Amplatz wire, the drainage catheter was removed without difficulty. A clean dressing was placed. The patient tolerated the procedure well without immediate complication. IMPRESSION: Study drainage catheter demonstrates decompressed cavity. Given continued low output, the drainage catheter was removed without difficulty. Further follow-up per Urology. Electronically Signed   By: Albin Felling M.D.   On: 08/01/2021 11:24   CT PELVIS WO CONTRAST  Result Date: 07/25/2021 CLINICAL DATA:  pelvic pain, had drain placed for recurrent fluid collection after prostatectomy EXAM: CT PELVIS WITHOUT CONTRAST TECHNIQUE: Multidetector CT imaging of the pelvis was performed following the standard protocol without intravenous contrast. RADIATION DOSE REDUCTION: This exam was performed according to the departmental dose-optimization program which includes automated exposure control, adjustment of the mA and/or kV according to patient size and/or use of iterative reconstruction technique. COMPARISON:  Multiple priors FINDINGS: Urinary Tract: The  urinary bladder is relatively decompressed, unremarkable. Stomach/Bowel: The visualized small large bowel are normal in caliber. The appendix is normal. Reproductive: Status post prostatectomy. Lymphatic: No pathologic lymphadenopathy. Other: Status post placement of percutaneous transgluteal drainage catheter which terminates in the right perirectal space. There has been appropriate decompression of the more superior aspect of the complex/multiloculated lower pelvic fluid collection. Similar appearance of suspected to additional lower pelvic fluid collections which remains similar in size to previous exam, measuring 6.0 x 4.4 cm, and 4.4 x 2.4 cm (see key image). Musculoskeletal: No aggressive osseous lesions. The soft tissues are unremarkable. IMPRESSION: Status post placement of a percutaneous transgluteal drainage catheter draining the superior aspect of the lower pelvic/perirectal  complex fluid collection. There has been appropriate decompression of the larger/more superior aspect of the complex/multiloculated fluid collection. There remains what appears to be 2 separate more inferior and smaller perirectal collections which remains similar in size to previous exam. Electronically Signed   By: Albin Felling M.D.   On: 07/25/2021 14:09   IR Sinus/Fist Tube Chk-Non GI  Result Date: 07/25/2021 INDICATION: Complex pelvic cystic collection status post percutaneous drain placement, now presents with drain related pain EXAM: Drain injection using fluoroscopy COMPARISON:  None Available. MEDICATIONS: Per EMR ANESTHESIA/SEDATION: Local analgesia COMPLICATIONS: None immediate. TECHNIQUE: Informed written consent was obtained from the patient after a thorough discussion of the procedural risks, benefits and alternatives. All questions were addressed. Maximal Sterile Barrier Technique was utilized including caps, mask, sterile gowns, sterile gloves, sterile drape, hand hygiene and skin antiseptic. A timeout was  performed prior to the initiation of the procedure. PROCEDURE: The patient was placed prone on the exam table. Contrast injection through the existing percutaneous drainage catheter was performed. A total of ml of 20 mL of diluted iodinated contrast material were slowly injected. Spot film images were obtained. IMPRESSION: 1. Injection of drainage catheter demonstrates appropriate decompressed cavity in the pelvis. There is additional opacification of a potential space/uncertain anatomic structure inferiorly. Possibilities include a second undrained collection as seen on CT, or possible seminal vesicle remnant as discussed with Urology as a potential etiology for this recurrent fluid collection. Given these findings, in addition to location of the decompressed cavity in close proximity to the rectum, the decision was made to abort sclerotherapy at this time. 2. Given that patient reported moderate relief of the rectal pressure after drain placement, the decision was made to keep the drainage catheter in place. Patient does report discomfort associated with the percutaneous tract, and a pain management plan was created in conjunction with Urology. Physical exam demonstrates no signs of infection at this time. Drain placed to bag drainage. Patient and family instructed to no longer flush the drainage catheter. Plan for patient to return to Interventional Radiology in 2 weeks for drain check. If drain output has decreased sufficiently, can consider drain removal at that time. Electronically Signed   By: Albin Felling M.D.   On: 07/25/2021 13:55   IR Radiologist Eval & Mgmt  Result Date: 07/21/2021 EXAM: Refer to EMR CHIEF COMPLAINT: Refer to EMR HISTORY OF PRESENT ILLNESS: Refer to EMR REVIEW OF SYSTEMS: Refer to EMR PHYSICAL EXAMINATION: Refer to EMR ASSESSMENT AND PLAN: Refer to EMR Electronically Signed   By: Albin Felling M.D.   On: 07/21/2021 14:33    Labs:  CBC: Recent Labs    06/23/21 0829  07/07/21 0831 07/28/21 0810 08/14/21 1123  WBC 7.4 7.4 8.5 8.2  HGB 13.2 12.6* 10.0* 10.4*  HCT 39.0 37.6* 29.7* 31.6*  PLT 261 262 358 443*    COAGS: Recent Labs    11/28/20 0818 08/14/21 1123  INR 1.0 1.0    BMP: Recent Labs    11/28/20 0818 12/03/20 0420 05/14/21 0922 07/10/21 1051  NA 138 137 143  --   K 3.6 5.0 5.3*  --   CL 101 104 103  --   CO2 '26 25 26  '$ --   GLUCOSE 139* 127* 92  --   BUN '18 14 14  '$ --   CALCIUM 9.4 8.5* 9.6  --   CREATININE 1.24 1.28* 1.16 1.10  GFRNONAA >60 >60  --   --     LIVER FUNCTION TESTS:  Recent Labs    05/14/21 0922  BILITOT 0.5  AST 29  ALT 31  ALKPHOS 73  PROT 6.8  ALBUMIN 4.7    Assessment and Plan: This is a 66 year old male with pertinent PMHx significant for prostate cancer s/p radical prostatectomy and pelvic lymph node dissection on 12/02/20 with post operative pelvic fluid collection. He has underwent previous aspiration on 4/17 yielding 450m with cytology negative for atypia or malignancy, 5/8 drainage catheter placed and continued until 5/26 when the output remained low and drain was removed. The patient complains of recurrent symptoms that started 1 week from drain removal and repeat CT imaging done today 6/8 reveals recurrence of pelvic fluid collection. He presents today for image guided aspiration versus drain placement.   The patient has been NPO, no blood thinners taken except ASA, imaging, labs and vitals have been reviewed.  Risks and benefits discussed with the patient including bleeding, infection, damage to adjacent structures, fistula connection, recurrence of fluid collection.  All of the patient's questions were answered, patient is agreeable to proceed. Consent signed and in chart.   Per urology visit note on 5/24- There is a concern that the perirectal mass in question (present preop) and the current fluid collection are related and may be malignant in nature despite negative cytology. [facility]  [facility] has an appointment this Monday with oncology.    Thank you for this interesting consult.  I greatly enjoyed meeting WKorbinT Staheli and look forward to participating in their care.  A copy of this report was sent to the requesting provider on this date.  Electronically Signed: MHedy Jacob PA-C 08/14/2021, 12:04 PM   I spent a total of 15 Minutes in face to face in clinical consultation, greater than 50% of which was counseling/coordinating care for recurrent symptomatic pelvic fluid collection.

## 2021-08-14 NOTE — Progress Notes (Signed)
Patient clinically stable post aspiration. Vitals stable, received Versed 2 mg along with Fentanyl 100 mcg IV for procedure. Report given to Lowanda Foster RN post procedure/specials.

## 2021-08-14 NOTE — Procedures (Signed)
Vascular and Interventional Radiology Procedure Note  Patient: KYLOR VALVERDE DOB: 1955-04-21 Medical Record Number: [id number] Note Date/Time: 08/14/21 12:47 PM   Performing Physician: Michaelle Birks, MD Assistant(s): None  Diagnosis: Pelvic fluid collection, recurrent  Procedure: ASPIRATION OF PELVIC COLLECTION  Anesthesia: Conscious Sedation Complications: None Estimated Blood Loss:  0 mL Specimens: Sent for Cytology  Findings:  Successful CT-guided ASPIRATION of deep pelvic / peri-rectal collection. 1-2 mL of a thick mucus-appearing fluid was aspirated.  Plan:  - Pt declined drain placement. Has had a previous drain to much discomfort  See detailed procedure note with images in PACS. The patient tolerated the procedure well without incident or complication and was returned to Recovery in stable condition.    Michaelle Birks, MD [facility] Radiology   Pager. [place]

## 2021-08-15 LAB — SURGICAL PATHOLOGY

## 2021-08-18 DIAGNOSIS — C61 Malignant neoplasm of prostate: Secondary | ICD-10-CM | POA: Diagnosis not present

## 2021-08-18 DIAGNOSIS — Z923 Personal history of irradiation: Secondary | ICD-10-CM | POA: Diagnosis not present

## 2021-08-18 DIAGNOSIS — Z79899 Other long term (current) drug therapy: Secondary | ICD-10-CM | POA: Diagnosis not present

## 2021-08-18 DIAGNOSIS — R197 Diarrhea, unspecified: Secondary | ICD-10-CM | POA: Diagnosis not present

## 2021-08-18 DIAGNOSIS — E785 Hyperlipidemia, unspecified: Secondary | ICD-10-CM | POA: Diagnosis not present

## 2021-08-18 DIAGNOSIS — Z79890 Hormone replacement therapy (postmenopausal): Secondary | ICD-10-CM | POA: Diagnosis not present

## 2021-08-18 DIAGNOSIS — Z7982 Long term (current) use of aspirin: Secondary | ICD-10-CM | POA: Diagnosis not present

## 2021-08-20 LAB — CYTOLOGY - NON PAP

## 2021-08-22 DIAGNOSIS — R9341 Abnormal radiologic findings on diagnostic imaging of renal pelvis, ureter, or bladder: Secondary | ICD-10-CM | POA: Diagnosis not present

## 2021-08-22 DIAGNOSIS — C61 Malignant neoplasm of prostate: Secondary | ICD-10-CM | POA: Diagnosis not present

## 2021-09-02 DIAGNOSIS — Z7982 Long term (current) use of aspirin: Secondary | ICD-10-CM | POA: Diagnosis not present

## 2021-09-02 DIAGNOSIS — E785 Hyperlipidemia, unspecified: Secondary | ICD-10-CM | POA: Diagnosis not present

## 2021-09-02 DIAGNOSIS — C61 Malignant neoplasm of prostate: Secondary | ICD-10-CM | POA: Diagnosis not present

## 2021-09-02 DIAGNOSIS — Z79899 Other long term (current) drug therapy: Secondary | ICD-10-CM | POA: Diagnosis not present

## 2021-09-02 DIAGNOSIS — Z87891 Personal history of nicotine dependence: Secondary | ICD-10-CM | POA: Diagnosis not present

## 2021-09-02 DIAGNOSIS — R188 Other ascites: Secondary | ICD-10-CM | POA: Diagnosis not present

## 2021-09-02 DIAGNOSIS — Z923 Personal history of irradiation: Secondary | ICD-10-CM | POA: Diagnosis not present

## 2021-09-02 DIAGNOSIS — I959 Hypotension, unspecified: Secondary | ICD-10-CM | POA: Diagnosis not present

## 2021-09-02 DIAGNOSIS — K651 Peritoneal abscess: Secondary | ICD-10-CM | POA: Diagnosis not present

## 2021-09-03 DIAGNOSIS — K651 Peritoneal abscess: Secondary | ICD-10-CM | POA: Diagnosis not present

## 2021-09-03 DIAGNOSIS — R188 Other ascites: Secondary | ICD-10-CM | POA: Diagnosis not present

## 2021-09-03 DIAGNOSIS — C61 Malignant neoplasm of prostate: Secondary | ICD-10-CM | POA: Diagnosis not present

## 2021-09-03 DIAGNOSIS — Z8546 Personal history of malignant neoplasm of prostate: Secondary | ICD-10-CM | POA: Diagnosis not present

## 2021-09-03 DIAGNOSIS — E785 Hyperlipidemia, unspecified: Secondary | ICD-10-CM | POA: Diagnosis not present

## 2021-09-03 DIAGNOSIS — R102 Pelvic and perineal pain: Secondary | ICD-10-CM | POA: Diagnosis not present

## 2021-09-03 DIAGNOSIS — E7849 Other hyperlipidemia: Secondary | ICD-10-CM | POA: Diagnosis not present

## 2021-09-03 DIAGNOSIS — Z923 Personal history of irradiation: Secondary | ICD-10-CM | POA: Diagnosis not present

## 2021-09-03 DIAGNOSIS — Z87891 Personal history of nicotine dependence: Secondary | ICD-10-CM | POA: Diagnosis not present

## 2021-09-03 DIAGNOSIS — Z79899 Other long term (current) drug therapy: Secondary | ICD-10-CM | POA: Diagnosis not present

## 2021-09-03 DIAGNOSIS — I959 Hypotension, unspecified: Secondary | ICD-10-CM | POA: Diagnosis not present

## 2021-09-03 DIAGNOSIS — Z7982 Long term (current) use of aspirin: Secondary | ICD-10-CM | POA: Diagnosis not present

## 2021-09-05 ENCOUNTER — Ambulatory Visit
Admission: RE | Admit: 2021-09-05 | Discharge: 2021-09-05 | Disposition: A | Discharge status: Home / Self Care | Payer: BC Managed Care – PPO | Source: Ambulatory Visit | Attending: Radiation Oncology | Admitting: Radiation Oncology

## 2021-09-05 ENCOUNTER — Other Ambulatory Visit: Payer: Self-pay | Admitting: *Deleted

## 2021-09-05 VITALS — BP 139/76 | HR 65 | Resp 18 | Ht 70.0 in | Wt 165.6 lb

## 2021-09-05 DIAGNOSIS — C61 Malignant neoplasm of prostate: Secondary | ICD-10-CM | POA: Insufficient documentation

## 2021-09-05 NOTE — Progress Notes (Signed)
Radiation Oncology Follow up Note  Name: Dale Gray   Date:   09/05/2021 MRN:  [id number] DOB: 1955/09/06    This 66 y.o. male presents to the clinic today for 1 month follow-up status post salvage radiation therapy status post robotic prostatectomy for Gleason 8 (4+4) adenocarcinoma the prostate with positive margins.  REFERRING PROVIDER: Jerrol Banana.,*  HPI: Patient is a 66 year old male now at 1 month having completed salvage radiation therapy status post robotic prostatectomy for Gleason 8 adenocarcinoma with positive margins..  His PSA never normalized.  Seen today in routine follow-up he is still having problems with a cystic formation in his pelvis that has been drained several times most recently at [facility].  He has had a recent drain which was last week.  And has had some improvement in the pressure he is feeling in his pelvis.  He is having no problems with lower urinary tract symptoms or diarrhea.  COMPLICATIONS OF TREATMENT: none  FOLLOW UP COMPLIANCE: keeps appointments   PHYSICAL EXAM:  BP 139/76   Pulse 65   Resp 18   Ht '5\' 10"'$  (1.778 m)   Wt 165 lb 9.6 oz (75.1 kg)   BMI 23.76 kg/m  Well-developed well-nourished patient in NAD. HEENT reveals PERLA, EOMI, discs not visualized.  Oral cavity is clear. No oral mucosal lesions are identified. Neck is clear without evidence of cervical or supraclavicular adenopathy. Lungs are clear to A&P. Cardiac examination is essentially unremarkable with regular rate and rhythm without murmur rub or thrill. Abdomen is benign with no organomegaly or masses noted. Motor sensory and DTR levels are equal and symmetric in the upper and lower extremities. Cranial nerves II through XII are grossly intact. Proprioception is intact. No peripheral adenopathy or edema is identified. No motor or sensory levels are noted. Crude visual fields are within normal range.  RADIOLOGY RESULTS: No current films for review  PLAN: Present time from the  prostate cancer salvage treatment he is doing well very low side effect profile.  Possibly continue to improve and continues follow-up at [facility] for the fluid collection in his pelvis.  I have asked to see him back in 3 months for follow-up with a PSA at that time.  Patient is to call with any concerns.  I would like to take this opportunity to thank you for allowing me to participate in the care of your patient.Noreene Filbert, MD

## 2021-09-06 ENCOUNTER — Other Ambulatory Visit: Payer: Self-pay | Admitting: Family Medicine

## 2021-09-08 NOTE — Telephone Encounter (Signed)
Requested Prescriptions  Pending Prescriptions Disp Refills  . omeprazole (PRILOSEC) 40 MG capsule [Pharmacy Med Name: OMEPRAZOLE '40MG'$  CAPSULES] 90 capsule 3    Sig: TAKE 1 CAPSULE(40 MG) BY MOUTH DAILY     Gastroenterology: Proton Pump Inhibitors Passed - 09/06/2021  6:32 AM      Passed - Valid encounter within last 12 months    Recent Outpatient Visits          1 month ago Seasonal allergic rhinitis due to pollen   [facility] Jerrol Banana., MD   3 months ago Chronic sinusitis, unspecified location   [facility] Jerrol Banana., MD   3 months ago Allergic rhinitis, unspecified seasonality, unspecified trigger   [facility] [name], PA-C   1 year ago Annual physical exam   [facility] Birdie Sons, MD   2 years ago Postviral fatigue syndrome   [place], Vickki Muff, PA-C      Future Appointments            In 1 week Ralene Bathe, MD [place]   In 2 months [name], [place]

## 2021-09-15 ENCOUNTER — Ambulatory Visit (INDEPENDENT_AMBULATORY_CARE_PROVIDER_SITE_OTHER): Payer: BC Managed Care – PPO | Admitting: Dermatology

## 2021-09-15 DIAGNOSIS — L814 Other melanin hyperpigmentation: Secondary | ICD-10-CM

## 2021-09-15 DIAGNOSIS — L578 Other skin changes due to chronic exposure to nonionizing radiation: Secondary | ICD-10-CM

## 2021-09-15 DIAGNOSIS — L821 Other seborrheic keratosis: Secondary | ICD-10-CM

## 2021-09-15 DIAGNOSIS — L570 Actinic keratosis: Secondary | ICD-10-CM

## 2021-09-15 NOTE — Patient Instructions (Signed)
Due to recent changes in healthcare laws, you may see results of your pathology and/or laboratory studies on MyChart before the doctors have had a chance to review them. We understand that in some cases there may be results that are confusing or concerning to you. Please understand that not all results are received at the same time and often the doctors may need to interpret multiple results in order to provide you with the best plan of care or course of treatment. Therefore, we ask that you please give us 2 business days to thoroughly review all your results before contacting the office for clarification. Should we see a critical lab result, you will be contacted sooner.   If You Need Anything After Your Visit  If you have any questions or concerns for your doctor, please call our main line at [phone] and press option 4 to reach your doctor's medical assistant. If no one answers, please leave a voicemail as directed and we will return your call as soon as possible. Messages left after 4 pm will be answered the following business day.   You may also send us a message via MyChart. We typically respond to MyChart messages within 1-2 business days.  For prescription refills, please ask your pharmacy to contact our office. Our fax number is [phone].  If you have an urgent issue when the clinic is closed that cannot wait until the next business day, you can page your doctor at the number below.    Please note that while we do our best to be available for urgent issues outside of office hours, we are not available 24/7.   If you have an urgent issue and are unable to reach us, you may choose to seek medical care at your doctor's office, retail clinic, urgent care center, or emergency room.  If you have a medical emergency, please immediately call 911 or go to the emergency department.  Pager Numbers  - Dr. Kowalski: [phone]  - Dr. Moye: [phone]  - Dr. Stewart:  [phone]  In the event of inclement weather, please call our main line at [phone] for an update on the status of any delays or closures.  Dermatology Medication Tips: Please keep the boxes that topical medications come in in order to help keep track of the instructions about where and how to use these. Pharmacies typically print the medication instructions only on the boxes and not directly on the medication tubes.   If your medication is too expensive, please contact our office at [phone] option 4 or send us a message through MyChart.   We are unable to tell what your co-pay for medications will be in advance as this is different depending on your insurance coverage. However, we may be able to find a substitute medication at lower cost or fill out paperwork to get insurance to cover a needed medication.   If a prior authorization is required to get your medication covered by your insurance company, please allow us 1-2 business days to complete this process.  Drug prices often vary depending on where the prescription is filled and some pharmacies may offer cheaper prices.  The website www.goodrx.com contains coupons for medications through different pharmacies. The prices here do not account for what the cost may be with help from insurance (it may be cheaper with your insurance), but the website can give you the price if you did not use any insurance.  - You can print the associated coupon and take it with   your prescription to the pharmacy.  - You may also stop by our office during regular business hours and pick up a GoodRx coupon card.  - If you need your prescription sent electronically to a different pharmacy, notify our office through [facility] MyChart or by phone at [phone] option 4.     Si Usted Necesita Algo Despus de Su Visita  Tambin puede enviarnos un mensaje a travs de MyChart. Por lo general respondemos a los mensajes de MyChart en el transcurso de 1 a 2  das hbiles.  Para renovar recetas, por favor pida a su farmacia que se ponga en contacto con nuestra oficina. Nuestro nmero de fax es el [phone].  Si tiene un asunto urgente cuando la clnica est cerrada y que no puede esperar hasta el siguiente da hbil, puede llamar/localizar a su doctor(a) al nmero que aparece a continuacin.   Por favor, tenga en cuenta que aunque hacemos todo lo posible para estar disponibles para asuntos urgentes fuera del horario de oficina, no estamos disponibles las 24 horas del da, los 7 das de la semana.   Si tiene un problema urgente y no puede comunicarse con nosotros, puede optar por buscar atencin mdica  en el consultorio de su doctor(a), en una clnica privada, en un centro de atencin urgente o en una sala de emergencias.  Si tiene una emergencia mdica, por favor llame inmediatamente al 911 o vaya a la sala de emergencias.  Nmeros de bper  - Dr. Kowalski: [phone]  - Dra. Moye: [phone]  - Dra. Stewart: [phone]  En caso de inclemencias del tiempo, por favor llame a nuestra lnea principal al [phone] para una actualizacin sobre el estado de cualquier retraso o cierre.  Consejos para la medicacin en dermatologa: Por favor, guarde las cajas en las que vienen los medicamentos de uso tpico para ayudarle a seguir las instrucciones sobre dnde y cmo usarlos. Las farmacias generalmente imprimen las instrucciones del medicamento slo en las cajas y no directamente en los tubos del medicamento.   Si su medicamento es muy caro, por favor, pngase en contacto con nuestra oficina llamando al [phone] y presione la opcin 4 o envenos un mensaje a travs de MyChart.   No podemos decirle cul ser su copago por los medicamentos por adelantado ya que esto es diferente dependiendo de la cobertura de su seguro. Sin embargo, es posible que podamos encontrar un medicamento sustituto a menor costo o llenar un formulario para que el  seguro cubra el medicamento que se considera necesario.   Si se requiere una autorizacin previa para que su compaa de seguros cubra su medicamento, por favor permtanos de 1 a 2 das hbiles para completar este proceso.  Los precios de los medicamentos varan con frecuencia dependiendo del lugar de dnde se surte la receta y alguna farmacias pueden ofrecer precios ms baratos.  El sitio web www.goodrx.com tiene cupones para medicamentos de diferentes farmacias. Los precios aqu no tienen en cuenta lo que podra costar con la ayuda del seguro (puede ser ms barato con su seguro), pero el sitio web puede darle el precio si no utiliz ningn seguro.  - Puede imprimir el cupn correspondiente y llevarlo con su receta a la farmacia.  - Tambin puede pasar por nuestra oficina durante el horario de atencin regular y recoger una tarjeta de cupones de GoodRx.  - Si necesita que su receta se enve electrnicamente a una farmacia diferente, informe a nuestra oficina a travs de MyChart de [facility]   o por telfono llamando al [phone] y presione la opcin 4.  

## 2021-09-15 NOTE — Progress Notes (Unsigned)
   Follow-Up Visit   Subjective  Dale Gray is a 66 y.o. male who presents for the following: Actinic Keratosis (Face and scalp - S/P LN2, check for new or persistent skin lesions). The patient has spots, moles and lesions to be evaluated, some may be new or changing.  The following portions of the chart were reviewed this encounter and updated as appropriate:   Tobacco  Allergies  Meds  Problems  Med Hx  Surg Hx  Fam Hx     Review of Systems:  No other skin or systemic complaints except as noted in HPI or Assessment and Plan.  Objective  Well appearing patient in no apparent distress; mood and affect are within normal limits.  A focused examination was performed including the face and scalp. Relevant physical exam findings are noted in the Assessment and Plan.  Scalp x 2 (2) Erythematous thin papules/macules with gritty scale.    Assessment & Plan  AK (actinic keratosis) (2) Scalp x 2  Destruction of lesion - Scalp x 2 Complexity: simple   Destruction method: cryotherapy   Informed consent: discussed and consent obtained   Timeout:  patient name, date of birth, surgical site, and procedure verified Lesion destroyed using liquid nitrogen: Yes   Region frozen until ice ball extended beyond lesion: Yes   Outcome: patient tolerated procedure well with no complications   Post-procedure details: wound care instructions given    Actinic Damage - chronic, secondary to cumulative UV radiation exposure/sun exposure over time - diffuse scaly erythematous macules with underlying dyspigmentation - Recommend daily broad spectrum sunscreen SPF 30+ to sun-exposed areas, reapply every 2 hours as needed.  - Recommend staying in the shade or wearing long sleeves, sun glasses (UVA+UVB protection) and wide brim hats (4-inch brim around the entire circumference of the hat). - Call for new or changing lesions.  Lentigines - Scattered tan macules - Due to sun exposure -  Benign-appering, observe - Recommend daily broad spectrum sunscreen SPF 30+ to sun-exposed areas, reapply every 2 hours as needed. - Call for any changes  Seborrheic Keratoses - Stuck-on, waxy, tan-brown papules and/or plaques  - Benign-appearing - Discussed benign etiology and prognosis. - Observe - Call for any changes  Return in about 1 year (around 09/16/2022) for UBSE.  Dale Gray, Dale Gray, am acting as scribe for Dale Ser, MD . Documentation: I have reviewed the above documentation for accuracy and completeness, and I agree with the above.  Dale Ser, MD

## 2021-09-16 ENCOUNTER — Encounter: Payer: Self-pay | Admitting: Dermatology

## 2021-09-19 DIAGNOSIS — N281 Cyst of kidney, acquired: Secondary | ICD-10-CM | POA: Diagnosis not present

## 2021-09-19 DIAGNOSIS — K651 Peritoneal abscess: Secondary | ICD-10-CM | POA: Diagnosis not present

## 2021-09-19 DIAGNOSIS — R9341 Abnormal radiologic findings on diagnostic imaging of renal pelvis, ureter, or bladder: Secondary | ICD-10-CM | POA: Diagnosis not present

## 2021-09-19 DIAGNOSIS — Z8719 Personal history of other diseases of the digestive system: Secondary | ICD-10-CM | POA: Diagnosis not present

## 2021-09-19 DIAGNOSIS — Z8619 Personal history of other infectious and parasitic diseases: Secondary | ICD-10-CM | POA: Diagnosis not present

## 2021-09-19 DIAGNOSIS — Z9889 Other specified postprocedural states: Secondary | ICD-10-CM | POA: Diagnosis not present

## 2021-09-19 DIAGNOSIS — R188 Other ascites: Secondary | ICD-10-CM | POA: Diagnosis not present

## 2021-09-19 DIAGNOSIS — K5730 Diverticulosis of large intestine without perforation or abscess without bleeding: Secondary | ICD-10-CM | POA: Diagnosis not present

## 2021-09-22 DIAGNOSIS — R188 Other ascites: Secondary | ICD-10-CM | POA: Diagnosis not present

## 2021-09-22 DIAGNOSIS — Z6823 Body mass index (BMI) 23.0-23.9, adult: Secondary | ICD-10-CM | POA: Diagnosis not present

## 2021-09-23 ENCOUNTER — Telehealth: Payer: Self-pay | Admitting: Family Medicine

## 2021-09-23 NOTE — Telephone Encounter (Signed)
Patient called stating he is struggling with fatigue since being on radiation/infusion treatment for CA prostate.   His oncologist told I'm have primary care order labs to see if he is still anemic.

## 2021-09-25 ENCOUNTER — Other Ambulatory Visit: Payer: Self-pay | Admitting: *Deleted

## 2021-09-25 DIAGNOSIS — D649 Anemia, unspecified: Secondary | ICD-10-CM

## 2021-09-25 DIAGNOSIS — C61 Malignant neoplasm of prostate: Secondary | ICD-10-CM

## 2021-09-26 DIAGNOSIS — C61 Malignant neoplasm of prostate: Secondary | ICD-10-CM | POA: Diagnosis not present

## 2021-09-26 DIAGNOSIS — D649 Anemia, unspecified: Secondary | ICD-10-CM | POA: Diagnosis not present

## 2021-09-27 LAB — CBC WITH DIFFERENTIAL/PLATELET
Basophils Absolute: 0.1 10*3/uL (ref 0.0–0.2)
Basos: 1 %
EOS (ABSOLUTE): 0.4 10*3/uL (ref 0.0–0.4)
Eos: 5 %
Hematocrit: 29.6 % — ABNORMAL LOW (ref 37.5–51.0)
Hemoglobin: 10 g/dL — ABNORMAL LOW (ref 13.0–17.7)
Immature Grans (Abs): 0 10*3/uL (ref 0.0–0.1)
Immature Granulocytes: 0 %
Lymphocytes Absolute: 0.8 10*3/uL (ref 0.7–3.1)
Lymphs: 12 %
MCH: 30 pg (ref 26.6–33.0)
MCHC: 33.8 g/dL (ref 31.5–35.7)
MCV: 89 fL (ref 79–97)
Monocytes Absolute: 0.8 10*3/uL (ref 0.1–0.9)
Monocytes: 13 %
Neutrophils Absolute: 4.6 10*3/uL (ref 1.4–7.0)
Neutrophils: 69 %
Platelets: 356 10*3/uL (ref 150–450)
RBC: 3.33 x10E6/uL — ABNORMAL LOW (ref 4.14–5.80)
RDW: 13.9 % (ref 11.6–15.4)
WBC: 6.6 10*3/uL (ref 3.4–10.8)

## 2021-10-06 ENCOUNTER — Telehealth: Payer: Self-pay | Admitting: *Deleted

## 2021-10-06 NOTE — Telephone Encounter (Signed)
Patient called concerning his lab results from 09/26/2021. Patient wants to know what he can don to help with anemia? Also something to help with fatigue? Please advise?

## 2021-10-08 NOTE — Telephone Encounter (Signed)
Left message to call back for appt

## 2021-10-09 ENCOUNTER — Encounter: Payer: Self-pay | Admitting: Family Medicine

## 2021-10-09 ENCOUNTER — Ambulatory Visit (INDEPENDENT_AMBULATORY_CARE_PROVIDER_SITE_OTHER): Payer: BC Managed Care – PPO | Admitting: Family Medicine

## 2021-10-09 VITALS — BP 116/74 | HR 66 | Temp 98.2°F | Resp 16 | Wt 165.0 lb

## 2021-10-09 DIAGNOSIS — C61 Malignant neoplasm of prostate: Secondary | ICD-10-CM | POA: Diagnosis not present

## 2021-10-09 DIAGNOSIS — R5382 Chronic fatigue, unspecified: Secondary | ICD-10-CM

## 2021-10-09 DIAGNOSIS — D649 Anemia, unspecified: Secondary | ICD-10-CM

## 2021-10-09 NOTE — Progress Notes (Signed)
Established patient visit  I,April Miller,acting as a scribe for Megan Mans, MD.,have documented all relevant documentation on the behalf of Megan Mans, MD,as directed by  Megan Mans, MD while in the presence of Megan Mans, MD.   Patient: Dale Gray   DOB: 09-23-1955   66 y.o. Male  MRN: [id number] Visit Date: 10/09/2021  Today's healthcare provider: Megan Mans, MD   Chief Complaint  Patient presents with   Fatigue   Subjective    HPI  Patent has been having extreme fatigue. Patient states he can hardly put one foot in front of the other. He is status post prostatectomy and radiation therapy for prostate cancer and androgen suppressive therapy which she has 1 more month of treatment. Had mild anemia in recent time but has had progressive fatigue. Work is very understanding and allows him to work what ever hours he can. Patient denies any chest pain melena hematochezia shortness of breath. Externally he states he is doing okay.  Medications: Outpatient Medications Prior to Visit  Medication Sig   acetaminophen (TYLENOL) 500 MG tablet Take 1,000 mg by mouth every 4 (four) hours as needed for mild pain.   aspirin 81 MG tablet Take 81 mg by mouth daily.   cetirizine (ZYRTEC) 10 MG tablet Take 1 tablet (10 mg total) by mouth daily.   ezetimibe (ZETIA) 10 MG tablet TAKE 1 TABLET DAILY   fish oil-omega-3 fatty acids 1000 MG capsule Take 1 g by mouth daily.   fluticasone (FLONASE) 50 MCG/ACT nasal spray Place 2 sprays into both nostrils daily.   ibuprofen (ADVIL) 100 MG tablet Take 1,000 mg by mouth every 4 (four) hours as needed for fever.   Multiple Vitamins-Minerals (MULTIVITAMIN WITH MINERALS) tablet Take 1 tablet by mouth daily.   omeprazole (PRILOSEC) 40 MG capsule TAKE 1 CAPSULE(40 MG) BY MOUTH DAILY   oxyCODONE-acetaminophen (PERCOCET) 5-325 MG tablet Take 1-2 tablets by mouth every 4 (four) hours as needed for moderate pain or  severe pain.   Relugolix (ORGOVYX) 120 MG TABS Take 120 mg by mouth daily.   tadalafil (CIALIS) 5 MG tablet Take 1 tablet (5 mg total) by mouth daily as needed for erectile dysfunction.   zolpidem (AMBIEN CR) 12.5 MG CR tablet TAKE 1 TABLET(12.5 MG) BY MOUTH AT BEDTIME AS NEEDED   hydrocortisone (ANUSOL-HC) 2.5 % rectal cream Place 1 application. rectally 2 (two) times daily. (Patient not taking: Reported on 08/14/2021)   predniSONE (DELTASONE) 20 MG tablet Take 1 tablet (20 mg total) by mouth daily with breakfast. (Patient not taking: Reported on 08/14/2021)   pregabalin (LYRICA) 50 MG capsule Take 1 capsule (50 mg total) by mouth 3 (three) times daily. (Patient not taking: Reported on 10/09/2021)   [DISCONTINUED] azelastine (ASTELIN) 0.1 % nasal spray Place 1 spray into both nostrils 2 (two) times daily. Use in each nostril as directed (Patient not taking: Reported on 08/14/2021)   [DISCONTINUED] loratadine (CLARITIN) 10 MG tablet Take 1 tablet (10 mg total) by mouth daily.   [DISCONTINUED] oxycodone-acetaminophen (PERCOCET) 2.5-325 MG tablet Take 1 tablet by mouth every 4 (four) hours as needed for pain.   No facility-administered medications prior to visit.    Review of Systems  Constitutional:  Negative for appetite change, chills and fever.  Respiratory:  Negative for chest tightness, shortness of breath and wheezing.   Cardiovascular:  Negative for chest pain and palpitations.  Gastrointestinal:  Negative for abdominal pain, nausea and vomiting.  Last CBC Lab Results  Component Value Date   WBC 7.8 10/09/2021   HGB 11.0 (L) 10/09/2021   HCT 32.8 (L) 10/09/2021   MCV 89 10/09/2021   MCH 29.9 10/09/2021   RDW 13.2 10/09/2021   PLT 378 10/09/2021       Objective    BP 116/74 (BP Location: Right Arm, Patient Position: Sitting, Cuff Size: Normal)   Pulse 66   Temp 98.2 F (36.8 C) (Temporal)   Resp 16   Wt 165 lb (74.8 kg)   SpO2 98%   BMI 23.68 kg/m  BP Readings from Last 3  Encounters:  10/09/21 116/74  09/05/21 139/76  08/14/21 137/81   Wt Readings from Last 3 Encounters:  10/09/21 165 lb (74.8 kg)  09/05/21 165 lb 9.6 oz (75.1 kg)  08/14/21 159 lb (72.1 kg)      Physical Exam Vitals reviewed.  Constitutional:      Appearance: He is well-developed.  HENT:     Head: Normocephalic and atraumatic.     Right Ear: External ear normal.     Left Ear: External ear normal.     Nose: Nose normal.  Eyes:     General:        Right eye: No discharge.     Conjunctiva/sclera: Conjunctivae normal.     Pupils: Pupils are equal, round, and reactive to light.  Neck:     Thyroid: No thyromegaly.     Trachea: No tracheal deviation.  Cardiovascular:     Rate and Rhythm: Normal rate and regular rhythm.     Heart sounds: Normal heart sounds. No murmur heard. Pulmonary:     Effort: Pulmonary effort is normal. No respiratory distress.     Breath sounds: Normal breath sounds. No decreased breath sounds, wheezing, rhonchi or rales.  Chest:     Chest wall: No tenderness.  Abdominal:     General: There is no distension.     Palpations: Abdomen is soft. There is no mass.     Tenderness: There is no abdominal tenderness. There is no guarding or rebound.  Musculoskeletal:        General: No tenderness. Normal range of motion.     Cervical back: Normal range of motion and neck supple.  Lymphadenopathy:     Cervical: No cervical adenopathy.  Skin:    General: Skin is warm and dry.     Findings: No erythema or rash.  Neurological:     Mental Status: He is alert and oriented to person, place, and time.     Cranial Nerves: No cranial nerve deficit.     Motor: No abnormal muscle tone.     Coordination: Coordination normal.     Deep Tendon Reflexes: Reflexes are normal and symmetric. Reflexes normal.  Psychiatric:        Behavior: Behavior normal.        Thought Content: Thought content normal.        Judgment: Judgment normal.       No results found for any  visits on 10/09/21.  Assessment & Plan     1. Chronic fatigue This is multifactorial.  Some from the anemia some from radiation therapy and some from hormone therapy and I think it is additive for all the above.  It will be 6 months before he is recovered from the radiation and hormone therapy.  Had a long discussion about this today. - Protein Electrophoresis, (serum) - CBC w/Diff/Platelet - Iron, TIBC and Ferritin Panel -  TSH - Testosterone - Sed Rate (ESR)  2. Anemia, unspecified type The lab work and may need referral to hematology.  He had a EGD and colonoscopy 1 year ago.  3. Prostate cancer [employer]) Aggressively treat with with surgery radiation and hormone therapy   Return in about 3 months (around 01/09/2022).      I, Megan Mans, MD, have reviewed all documentation for this visit. The documentation on 10/10/21 for the exam, diagnosis, procedures, and orders are all accurate and complete.    Sicilia Killough Wendelyn Breslow, MD  [facility] [phone] (phone) [phone] (fax)  [facility]

## 2021-10-13 ENCOUNTER — Encounter: Payer: Self-pay | Admitting: Family Medicine

## 2021-10-13 ENCOUNTER — Ambulatory Visit (INDEPENDENT_AMBULATORY_CARE_PROVIDER_SITE_OTHER): Payer: BC Managed Care – PPO | Admitting: Family Medicine

## 2021-10-13 VITALS — BP 115/77 | HR 77 | Temp 98.6°F | Resp 16 | Wt 161.5 lb

## 2021-10-13 DIAGNOSIS — H7292 Unspecified perforation of tympanic membrane, left ear: Secondary | ICD-10-CM

## 2021-10-13 DIAGNOSIS — H6690 Otitis media, unspecified, unspecified ear: Secondary | ICD-10-CM | POA: Diagnosis not present

## 2021-10-13 DIAGNOSIS — J0110 Acute frontal sinusitis, unspecified: Secondary | ICD-10-CM

## 2021-10-13 MED ORDER — AMOXICILLIN 500 MG PO CAPS: 1000.0000 mg | ORAL_CAPSULE | Freq: Three times a day (TID) | ORAL | 0 refills | Status: AC

## 2021-10-13 NOTE — Progress Notes (Signed)
I,Jana Robinson,acting as a scribe for Lelon Huh, MD.,have documented all relevant documentation on the behalf of Lelon Huh, MD,as directed by  Lelon Huh, MD while in the presence of Lelon Huh, MD.   Established patient visit   Patient: Dale Gray   DOB: 1955-08-31   66 y.o. Male  MRN: [id number] Visit Date: 10/13/2021  Today's healthcare provider: Lelon Huh, MD   Chief Complaint  Patient presents with   Ear Pain   Subjective    HPI  Patient presents with concerns he may have a ruptured ear drum/left.  Reports on Saturday morning  patient woke with ear pain and felt a pop and afterwards had clear/yellowish drainage from ear.   Reports the pain is extreme.  Called [facility] and prescribed Azelastine, Benzonatate and Ofloxicin 3% drops.   He also reports he had been having increase pressure of frontal sinuses for a few days prior to onset of ear pain, and continues to have today.    Medications: Outpatient Medications Prior to Visit  Medication Sig   acetaminophen (TYLENOL) 500 MG tablet Take 1,000 mg by mouth every 4 (four) hours as needed for mild pain.   aspirin 81 MG tablet Take 81 mg by mouth daily.   azelastine (ASTELIN) 0.1 % nasal spray Place 2 sprays into both nostrils 2 (two) times daily.   benzonatate (TESSALON) 100 MG capsule Take 100 mg by mouth 3 (three) times daily.   cetirizine (ZYRTEC) 10 MG tablet Take 1 tablet (10 mg total) by mouth daily.   ezetimibe (ZETIA) 10 MG tablet TAKE 1 TABLET DAILY   fish oil-omega-3 fatty acids 1000 MG capsule Take 1 g by mouth daily.   fluticasone (FLONASE) 50 MCG/ACT nasal spray Place 2 sprays into both nostrils daily.   hydrocortisone (ANUSOL-HC) 2.5 % rectal cream Place 1 application. rectally 2 (two) times daily.   ibuprofen (ADVIL) 100 MG tablet Take 1,000 mg by mouth every 4 (four) hours as needed for fever.   Multiple Vitamins-Minerals (MULTIVITAMIN WITH MINERALS) tablet Take 1 tablet by mouth daily.    ofloxacin (FLOXIN) 0.3 % OTIC solution INSTILL 5 DROPS IN AFFECTED EAR(S) TWICE DAILY FOR 7 DAYS   omeprazole (PRILOSEC) 40 MG capsule TAKE 1 CAPSULE(40 MG) BY MOUTH DAILY   oxyCODONE-acetaminophen (PERCOCET) 5-325 MG tablet Take 1-2 tablets by mouth every 4 (four) hours as needed for moderate pain or severe pain.   predniSONE (DELTASONE) 20 MG tablet Take 1 tablet (20 mg total) by mouth daily with breakfast.   pregabalin (LYRICA) 50 MG capsule Take 1 capsule (50 mg total) by mouth 3 (three) times daily.   Relugolix (ORGOVYX) 120 MG TABS Take 120 mg by mouth daily.   tadalafil (CIALIS) 5 MG tablet Take 1 tablet (5 mg total) by mouth daily as needed for erectile dysfunction.   zolpidem (AMBIEN CR) 12.5 MG CR tablet TAKE 1 TABLET(12.5 MG) BY MOUTH AT BEDTIME AS NEEDED   No facility-administered medications prior to visit.         Objective    BP 115/77 (BP Location: Right Arm, Patient Position: Sitting, Cuff Size: Normal)   Pulse 77   Temp 98.6 F (37 C) (Oral)   Resp 16   Wt 161 lb 8 oz (73.3 kg)   SpO2 100%   BMI 23.17 kg/m    Physical Exam   Small perforation of left TM with small amount purulent drainage and inflammation.     Assessment & Plan  1. Perforation of left tympanic membrane   2. Acute otitis media, unspecified otitis media type  3. Acute non-recurrent frontal sinusitis  - amoxicillin (AMOXIL) 500 MG capsule; Take 2 capsules (1,000 mg total) by mouth 3 (three) times daily for 7 days.  Dispense: 42 capsule; Refill: 0  Call if symptoms change or if not rapidly improving.        The entirety of the information documented in the History of Present Illness, Review of Systems and Physical Exam were personally obtained by me. Portions of this information were initially documented by the CMA and reviewed by me for thoroughness and accuracy.     Lelon Huh, MD  [facility] [phone] (phone) [phone] (fax)  [place]

## 2021-10-14 LAB — CBC WITH DIFFERENTIAL/PLATELET
Basophils Absolute: 0 10*3/uL (ref 0.0–0.2)
Basos: 1 %
EOS (ABSOLUTE): 0.2 10*3/uL (ref 0.0–0.4)
Eos: 2 %
Hematocrit: 32.8 % — ABNORMAL LOW (ref 37.5–51.0)
Hemoglobin: 11 g/dL — ABNORMAL LOW (ref 13.0–17.7)
Immature Grans (Abs): 0 10*3/uL (ref 0.0–0.1)
Immature Granulocytes: 0 %
Lymphocytes Absolute: 0.6 10*3/uL — ABNORMAL LOW (ref 0.7–3.1)
Lymphs: 8 %
MCH: 29.9 pg (ref 26.6–33.0)
MCHC: 33.5 g/dL (ref 31.5–35.7)
MCV: 89 fL (ref 79–97)
Monocytes Absolute: 1 10*3/uL — ABNORMAL HIGH (ref 0.1–0.9)
Monocytes: 13 %
Neutrophils Absolute: 5.9 10*3/uL (ref 1.4–7.0)
Neutrophils: 76 %
Platelets: 378 10*3/uL (ref 150–450)
RBC: 3.68 x10E6/uL — ABNORMAL LOW (ref 4.14–5.80)
RDW: 13.2 % (ref 11.6–15.4)
WBC: 7.8 10*3/uL (ref 3.4–10.8)

## 2021-10-14 LAB — IRON,TIBC AND FERRITIN PANEL
Ferritin: 296 ng/mL (ref 30–400)
Iron Saturation: 13 % — ABNORMAL LOW (ref 15–55)
Iron: 32 ug/dL — ABNORMAL LOW (ref 38–169)
Total Iron Binding Capacity: 241 ug/dL — ABNORMAL LOW (ref 250–450)
UIBC: 209 ug/dL (ref 111–343)

## 2021-10-14 LAB — PROTEIN ELECTROPHORESIS, SERUM
A/G Ratio: 1.2 (ref 0.7–1.7)
Albumin ELP: 3.7 g/dL (ref 2.9–4.4)
Alpha 1: 0.3 g/dL (ref 0.0–0.4)
Alpha 2: 0.9 g/dL (ref 0.4–1.0)
Beta: 0.9 g/dL (ref 0.7–1.3)
Gamma Globulin: 1.2 g/dL (ref 0.4–1.8)
Globulin, Total: 3.2 g/dL (ref 2.2–3.9)
Total Protein: 6.9 g/dL (ref 6.0–8.5)

## 2021-10-14 LAB — TSH: TSH: 1.39 u[IU]/mL (ref 0.450–4.500)

## 2021-10-14 LAB — SEDIMENTATION RATE: Sed Rate: 42 mm/hr — ABNORMAL HIGH (ref 0–30)

## 2021-10-14 LAB — TESTOSTERONE: Testosterone: <3 ng/dL — ABNORMAL LOW (ref 264–916)

## 2021-10-17 DIAGNOSIS — K651 Peritoneal abscess: Secondary | ICD-10-CM | POA: Diagnosis not present

## 2021-10-17 DIAGNOSIS — R9341 Abnormal radiologic findings on diagnostic imaging of renal pelvis, ureter, or bladder: Secondary | ICD-10-CM | POA: Diagnosis not present

## 2021-10-17 DIAGNOSIS — Z6823 Body mass index (BMI) 23.0-23.9, adult: Secondary | ICD-10-CM | POA: Diagnosis not present

## 2021-10-23 ENCOUNTER — Ambulatory Visit: Payer: BC Managed Care – PPO | Admitting: Family Medicine

## 2021-11-17 ENCOUNTER — Other Ambulatory Visit: Payer: BC Managed Care – PPO

## 2021-11-17 DIAGNOSIS — C61 Malignant neoplasm of prostate: Secondary | ICD-10-CM

## 2021-11-18 LAB — PSA: Prostate Specific Ag, Serum: <0.1 ng/mL (ref 0.0–4.0)

## 2021-11-19 ENCOUNTER — Ambulatory Visit: Payer: BC Managed Care – PPO | Admitting: Urology

## 2021-11-20 ENCOUNTER — Encounter: Payer: Self-pay | Admitting: Urology

## 2021-11-20 ENCOUNTER — Ambulatory Visit (INDEPENDENT_AMBULATORY_CARE_PROVIDER_SITE_OTHER): Payer: BC Managed Care – PPO | Admitting: Urology

## 2021-11-20 VITALS — BP 144/79 | HR 55 | Ht 70.0 in | Wt 165.0 lb

## 2021-11-20 DIAGNOSIS — R188 Other ascites: Secondary | ICD-10-CM | POA: Diagnosis not present

## 2021-11-20 DIAGNOSIS — Z8546 Personal history of malignant neoplasm of prostate: Secondary | ICD-10-CM

## 2021-11-20 DIAGNOSIS — N393 Stress incontinence (female) (male): Secondary | ICD-10-CM | POA: Diagnosis not present

## 2021-11-20 DIAGNOSIS — N5231 Erectile dysfunction following radical prostatectomy: Secondary | ICD-10-CM | POA: Diagnosis not present

## 2021-11-20 DIAGNOSIS — C61 Malignant neoplasm of prostate: Secondary | ICD-10-CM

## 2021-11-20 NOTE — Progress Notes (Signed)
11/20/2021 8:48 AM   Dale Gray 1955-12-05 [id number]  Referring provider: Jerrol Banana., MD [address] [place],  [place] [address]  Chief Complaint  Patient presents with   Prostate Cancer    HPI: 66 year old male with unfavorable intermediate risk prostate cancer with PSA persistence (+SV, +margin left base; pT3) status post prostatectomy now status post salvage IMRT on Orgovyx.  He returns today for 67-monthfollow-up.  His PSA is undetectable.  [facility].  Its been drained several subsequent times and he seems to be doing much better from this aspect.  [facility]  They agree with his current plan of 6 months of ADT.  His last dose was mid last week.  Overall, he is doing fine.  He is no longer having significant GI issues and pelvic pressure.  He thinks he is improving every day in regards to his fluid collection.  He continues to have severe incontinence and erectile dysfunction.   PMH: Past Medical History:  Diagnosis Date   Complication of anesthesia    hard time waking up   GERD (gastroesophageal reflux disease)    Hemorrhoid    Hyperlipidemia    Joint pain    PONV (postoperative nausea and vomiting)    after 1st ear surgery   [place]    Reflux    Seasonal allergies     Surgical History: Past Surgical History:  Procedure Laterality Date   ANAL FISSURE REPAIR  2014   CARPAL TUNNEL RELEASE Right    and elbow surgery   COLONOSCOPY WITH PROPOFOL N/A 03/19/2017   Procedure: COLONOSCOPY WITH PROPOFOL;  Surgeon: TVirgel Manifold MD;  Location: [facility] ENDOSCOPY;  Service: Endoscopy;  Laterality: N/A;   COLONOSCOPY WITH PROPOFOL N/A 08/20/2020   Procedure: COLONOSCOPY WITH PROPOFOL;  Surgeon: WLucilla Lame MD;  [facility];  Service: Endoscopy;  Laterality: N/A;   ESOPHAGOGASTRODUODENOSCOPY (EGD) WITH  PROPOFOL N/A 08/20/2020   Procedure: ESOPHAGOGASTRODUODENOSCOPY (EGD) WITH PROPOFOL;  Surgeon: WLucilla Lame MD;  Location: [facility] ENDOSCOPY;  Service: Endoscopy;  Laterality: N/A;   IR RADIOLOGIST EVAL & MGMT  07/21/2021   IR SINUS/FIST TUBE CHK-NON GI  07/25/2021   IR SINUS/FIST TUBE CHK-NON GI  08/01/2021   PELVIC LYMPH NODE DISSECTION Bilateral 12/02/2020   Procedure: PELVIC LYMPH NODE DISSECTION;  Surgeon: BHollice Espy MD;  Location: [facility] ORS;  Service: Urology;  Laterality: Bilateral;   ROBOT ASSISTED LAPAROSCOPIC RADICAL PROSTATECTOMY N/A 12/02/2020   Procedure: XI ROBOTIC ASSISTED LAPAROSCOPIC RADICAL PROSTATECTOMY;  Surgeon: BHollice Espy MD;  Location: [facility] ORS;  Service: Urology;  Laterality: N/A;   TYMPANOPLASTY Left 04/30/2014   TYMPANOPLASTY  10/2014   VASECTOMY  01/06/2002    Home Medications:  Allergies as of 11/20/2021       Reactions   Lovastatin    Sleep disturbance and extremity pains        Medication List        Accurate as of November 20, 2021  8:48 AM. If you have any questions, ask your nurse or doctor.          STOP taking these medications    ofloxacin 0.3 % OTIC solution Commonly known as: FLOXIN Stopped by: AHollice Espy MD   Orgovyx 120 MG Tabs Generic drug: Relugolix Stopped by: AHollice Espy MD   predniSONE 20 MG tablet Commonly known as: DELTASONE Stopped by: AHollice Espy  MD       TAKE these medications    acetaminophen 500 MG tablet Commonly known as: TYLENOL Take 1,000 mg by mouth every 4 (four) hours as needed for mild pain.   aspirin 81 MG tablet Take 81 mg by mouth daily.   azelastine 0.1 % nasal spray Commonly known as: ASTELIN Place 2 sprays into both nostrils 2 (two) times daily.   benzonatate 100 MG capsule Commonly known as: TESSALON Take 100 mg by mouth 3 (three) times daily.   cetirizine 10 MG tablet Commonly known as: ZYRTEC Take 1 tablet (10 mg total) by mouth daily.   ezetimibe 10 MG  tablet Commonly known as: ZETIA TAKE 1 TABLET DAILY   fish oil-omega-3 fatty acids 1000 MG capsule Take 1 g by mouth daily.   fluticasone 50 MCG/ACT nasal spray Commonly known as: FLONASE Place 2 sprays into both nostrils daily.   hydrocortisone 2.5 % rectal cream Commonly known as: Anusol-HC Place 1 application. rectally 2 (two) times daily.   ibuprofen 100 MG tablet Commonly known as: ADVIL Take 1,000 mg by mouth every 4 (four) hours as needed for fever.   multivitamin with minerals tablet Take 1 tablet by mouth daily.   omeprazole 40 MG capsule Commonly known as: PRILOSEC TAKE 1 CAPSULE(40 MG) BY MOUTH DAILY   oxyCODONE-acetaminophen 5-325 MG tablet Commonly known as: Percocet Take 1-2 tablets by mouth every 4 (four) hours as needed for moderate pain or severe pain.   pregabalin 50 MG capsule Commonly known as: LYRICA Take 1 capsule (50 mg total) by mouth 3 (three) times daily.   tadalafil 5 MG tablet Commonly known as: CIALIS Take 1 tablet (5 mg total) by mouth daily as needed for erectile dysfunction.   zolpidem 12.5 MG CR tablet Commonly known as: AMBIEN CR TAKE 1 TABLET(12.5 MG) BY MOUTH AT BEDTIME AS NEEDED        Allergies:  Allergies  Allergen Reactions   Lovastatin     Sleep disturbance and extremity pains    Family History: Family History  Problem Relation Age of Onset   Heart disease Mother    Asthma Mother    Heart disease Father    CAD Father    Healthy Sister    Healthy Brother    Healthy Daughter    Healthy Daughter    Diabetes Maternal Aunt    Diabetes Maternal Uncle    Colon cancer Neg Hx     Social History:  reports that he has never smoked. He has never used smokeless tobacco. He reports that he does not drink alcohol and does not use drugs.   Physical Exam: BP (!) 144/79   Pulse (!) 55   Ht '5\' 10"'$  (1.778 m)   Wt 165 lb (74.8 kg)   BMI 23.68 kg/m   Constitutional:  Alert and oriented, No acute distress. HEENT: [place] AT,  moist mucus membranes.  Trachea midline, no masses. Cardiovascular: No clubbing, cyanosis, or edema. Respiratory: Normal respiratory effort, no increased work of breathing. GI: Abdomen is soft, nontender, nondistended, no abdominal masses GU: No CVA tenderness Skin: No rashes, bruises or suspicious lesions. Neurologic: Grossly intact, no focal deficits, moving all 4 extremities. Psychiatric: Normal mood and affect.  Laboratory Data: Lab Results  Component Value Date   WBC 7.8 10/09/2021   HGB 11.0 (L) 10/09/2021   HCT 32.8 (L) 10/09/2021   MCV 89 10/09/2021   PLT 378 10/09/2021    Lab Results  Component Value Date  CREATININE 1.10 07/10/2021    Lab Results  Component Value Date   TESTOSTERONE <3 (L) 10/09/2021    Lab Results  Component Value Date   HGBA1C 5.6 05/14/2021   Assessment & Plan:    1. Prostate cancer [facility]) Currently NED, PSA is undetectable  We will continue to check his PSA every 3 months, consider repeat PET scan if his PSA begins to rise again  He has now completed 6 months course of Orgovyx, may discontinue ADT at this time - PSA; Future - PSA; Future  2. Pelvic fluid collection Managed by [facility], improving - PSA; Future - PSA; Future  3. SUI (stress urinary incontinence), male Severe, anticipate he will likely need a sphincter down the road.  Would prefer to wait at least 1 year following completion of radiation therapy  4. Erectile dysfunction after radical prostatectomy We discussed options for severe erectile dysfunction including injections and prosthesis.  He is not interested in pursuing it at this time.   Return in about 3 months (around 02/19/2022) for PSA lab only in 3 months, then PSA in 6 months with an appointment.  [name], MD  [facility] [address] [place], [place] [address] [phone]  I spent 34 total minutes on the day of the encounter including pre-visit review of the  medical record, face-to-face time with the patient, and post visit ordering of labs/imaging/tests.

## 2021-12-01 ENCOUNTER — Other Ambulatory Visit: Payer: Self-pay | Admitting: *Deleted

## 2021-12-01 DIAGNOSIS — C61 Malignant neoplasm of prostate: Secondary | ICD-10-CM

## 2021-12-05 ENCOUNTER — Inpatient Hospital Stay: Payer: BC Managed Care – PPO

## 2021-12-12 ENCOUNTER — Ambulatory Visit: Payer: BC Managed Care – PPO | Admitting: Radiation Oncology

## 2021-12-15 ENCOUNTER — Other Ambulatory Visit: Payer: Self-pay | Admitting: Family Medicine

## 2021-12-15 ENCOUNTER — Ambulatory Visit: Payer: BC Managed Care – PPO | Admitting: Radiation Oncology

## 2021-12-15 DIAGNOSIS — F5101 Primary insomnia: Secondary | ICD-10-CM

## 2021-12-15 MED ORDER — ZOLPIDEM TARTRATE ER 12.5 MG PO TBCR
EXTENDED_RELEASE_TABLET | ORAL | 1 refills | Status: DC
Start: 2021-12-15 — End: 2022-01-12

## 2021-12-15 NOTE — Telephone Encounter (Signed)
Supposedly [facility] requested refills last week on Zolpidem with no call back from [place].   Patient is out of Zolpidem 12.5 mg. and is requesting more refills please.   [facility] at the corner of [address]

## 2021-12-15 NOTE — Addendum Note (Signed)
Addended by: Julieta Bellini on: 12/15/2021 04:25 PM   Modules accepted: Orders

## 2021-12-24 ENCOUNTER — Other Ambulatory Visit: Payer: Self-pay | Admitting: Family Medicine

## 2021-12-24 DIAGNOSIS — G629 Polyneuropathy, unspecified: Secondary | ICD-10-CM

## 2022-01-02 DIAGNOSIS — K651 Peritoneal abscess: Secondary | ICD-10-CM | POA: Diagnosis not present

## 2022-01-02 DIAGNOSIS — Z6824 Body mass index (BMI) 24.0-24.9, adult: Secondary | ICD-10-CM | POA: Diagnosis not present

## 2022-01-02 DIAGNOSIS — L02211 Cutaneous abscess of abdominal wall: Secondary | ICD-10-CM | POA: Diagnosis not present

## 2022-01-02 DIAGNOSIS — R188 Other ascites: Secondary | ICD-10-CM | POA: Diagnosis not present

## 2022-01-12 ENCOUNTER — Other Ambulatory Visit: Payer: Self-pay | Admitting: Physician Assistant

## 2022-01-12 ENCOUNTER — Telehealth: Payer: Self-pay | Admitting: Physician Assistant

## 2022-01-12 DIAGNOSIS — F5101 Primary insomnia: Secondary | ICD-10-CM

## 2022-01-12 MED ORDER — ZOLPIDEM TARTRATE ER 12.5 MG PO TBCR: EXTENDED_RELEASE_TABLET | ORAL | 1 refills | Status: DC

## 2022-01-12 NOTE — Telephone Encounter (Signed)
Patient is requesting refills on Zolpidem 12.5 mg. to be sent to [facility] mail in pharmacy

## 2022-01-13 NOTE — Telephone Encounter (Signed)
Called and cancelled

## 2022-01-14 ENCOUNTER — Ambulatory Visit: Payer: BC Managed Care – PPO | Admitting: Family Medicine

## 2022-02-03 ENCOUNTER — Telehealth: Payer: Self-pay | Admitting: Physician Assistant

## 2022-02-03 MED ORDER — FLUTICASONE PROPIONATE 50 MCG/ACT NA SUSP: 2.0000 | Freq: Every day | NASAL | 6 refills | Status: DC

## 2022-02-03 NOTE — Telephone Encounter (Signed)
Pharmacy requesting refill of Fluticasone 50 MCG

## 2022-02-20 ENCOUNTER — Other Ambulatory Visit: Payer: BC Managed Care – PPO

## 2022-02-20 DIAGNOSIS — R188 Other ascites: Secondary | ICD-10-CM

## 2022-02-20 DIAGNOSIS — C61 Malignant neoplasm of prostate: Secondary | ICD-10-CM

## 2022-02-21 LAB — PSA: Prostate Specific Ag, Serum: <0.1 ng/mL (ref 0.0–4.0)

## 2022-05-05 ENCOUNTER — Ambulatory Visit: Payer: BC Managed Care – PPO | Admitting: Physician Assistant

## 2022-05-05 ENCOUNTER — Encounter: Payer: Self-pay | Admitting: Physician Assistant

## 2022-05-05 VITALS — BP 124/72 | HR 60 | Wt 180.5 lb

## 2022-05-05 DIAGNOSIS — J329 Chronic sinusitis, unspecified: Secondary | ICD-10-CM

## 2022-05-05 DIAGNOSIS — R0981 Nasal congestion: Secondary | ICD-10-CM | POA: Diagnosis not present

## 2022-05-05 MED ORDER — AZELASTINE HCL 0.1 % NA SOLN: 1.0000 | Freq: Two times a day (BID) | NASAL | 1 refills | Status: DC

## 2022-05-05 NOTE — Progress Notes (Signed)
I,Sha'taria Tyson,acting as a Education administrator for [employer], PA-C.,have documented all relevant documentation on the behalf of [name], PA-C,as directed by  [name], PA-C while in the presence of [name], PA-C.   Established patient visit   Patient: Dale Gray   DOB: 10-04-1955   67 y.o. Male  MRN: [id number] Visit Date: 05/05/2022  Today's healthcare provider: [name], PA-C   C. Nasal congestion  Subjective    HPI   Pt reports continued nasal congestion for around 6 months. He is taking OTC cold medicine, mucinex, zyretec, flonase, with no improvement. Reports history of surgeries on L ear. Reports muffled/dullness in L ear. Denies fevers, chills, headaches, sinus pain. Denies discharge from ear.  Pt was last seen in August for a left TM perforation, tx with amoxicillin.   Medications: Outpatient Medications Prior to Visit  Medication Sig   acetaminophen (TYLENOL) 500 MG tablet Take 1,000 mg by mouth every 4 (four) hours as needed for mild pain.   aspirin 81 MG tablet Take 81 mg by mouth daily.   benzonatate (TESSALON) 100 MG capsule Take 100 mg by mouth 3 (three) times daily.   cetirizine (ZYRTEC) 10 MG tablet Take 1 tablet (10 mg total) by mouth daily.   ezetimibe (ZETIA) 10 MG tablet TAKE 1 TABLET DAILY   fish oil-omega-3 fatty acids 1000 MG capsule Take 1 g by mouth daily.   fluticasone (FLONASE) 50 MCG/ACT nasal spray Place 2 sprays into both nostrils daily.   hydrocortisone (ANUSOL-HC) 2.5 % rectal cream Place 1 application. rectally 2 (two) times daily.   ibuprofen (ADVIL) 100 MG tablet Take 1,000 mg by mouth every 4 (four) hours as needed for fever.   Multiple Vitamins-Minerals (MULTIVITAMIN WITH MINERALS) tablet Take 1 tablet by mouth daily.   omeprazole (PRILOSEC) 40 MG capsule TAKE 1 CAPSULE(40 MG) BY MOUTH DAILY   pregabalin (LYRICA) 50 MG capsule TAKE 1 CAPSULE(50 MG) BY MOUTH THREE TIMES DAILY   tadalafil (CIALIS) 5 MG tablet Take 1  tablet (5 mg total) by mouth daily as needed for erectile dysfunction.   zolpidem (AMBIEN CR) 12.5 MG CR tablet TAKE 1 TABLET(12.5 MG) BY MOUTH AT BEDTIME AS NEEDED   [DISCONTINUED] azelastine (ASTELIN) 0.1 % nasal spray Place 2 sprays into both nostrils 2 (two) times daily.   [DISCONTINUED] oxyCODONE-acetaminophen (PERCOCET) 5-325 MG tablet Take 1-2 tablets by mouth every 4 (four) hours as needed for moderate pain or severe pain.   No facility-administered medications prior to visit.    Review of Systems  Constitutional:  Negative for fatigue and fever.  HENT:  Positive for congestion and hearing loss.   Respiratory:  Negative for cough and shortness of breath.   Cardiovascular:  Negative for chest pain, palpitations and leg swelling.  Neurological:  Negative for dizziness and headaches.      Objective    BP 124/72   Pulse 60   Wt 180 lb 8 oz (81.9 kg)   SpO2 100%   BMI 25.90 kg/m   Physical Exam Vitals reviewed.  Constitutional:      Appearance: He is not ill-appearing.  HENT:     Head: Normocephalic.     Right Ear: Tympanic membrane normal.     Ears:     Comments: Scarring seen on left TM    Nose: Congestion present. No rhinorrhea.     Mouth/Throat:     Pharynx: No oropharyngeal exudate or posterior oropharyngeal erythema.  Eyes:     Conjunctiva/sclera:  Conjunctivae normal.  Cardiovascular:     Rate and Rhythm: Normal rate.  Pulmonary:     Effort: Pulmonary effort is normal. No respiratory distress.  Neurological:     General: No focal deficit present.     Mental Status: He is alert and oriented to person, place, and time.  Psychiatric:        Mood and Affect: Mood normal.        Behavior: Behavior normal.      No results found for any visits on 05/05/22.  Assessment & Plan     Nasal congestion 2. Recurrent sinusitis Ref to ENT Given absence of fever, headaches, sinus pain, will not treat for acute sinusitis/infection today.  Recommend flonase, rx  azelastine, sinus lavage, continue zyretec  Return if symptoms worsen or fail to improve.      I, [name], PA-C have reviewed all documentation for this visit. The documentation on  05/05/22 for the exam, diagnosis, procedures, and orders are all accurate and complete.  [name], PA-C [facility] [address] [place], [place], [address] Office: [phone] Fax: [facility]

## 2022-05-06 ENCOUNTER — Other Ambulatory Visit: Payer: Self-pay | Admitting: Physician Assistant

## 2022-05-06 ENCOUNTER — Telehealth: Payer: Self-pay | Admitting: Physician Assistant

## 2022-05-06 DIAGNOSIS — J329 Chronic sinusitis, unspecified: Secondary | ICD-10-CM

## 2022-05-06 MED ORDER — PREDNISONE 20 MG PO TABS: 20.0000 mg | ORAL_TABLET | Freq: Every day | ORAL | 0 refills | Status: DC

## 2022-05-06 NOTE — Telephone Encounter (Signed)
Patient cannot be seen at ENT until 05/19/22.  He said you wanted him to let you know.  He got the sinus rinse and he said thick yellow mucous was coming out.     Please let him know if there is anything else he can do. He is miserable and going home from work.

## 2022-05-06 NOTE — Telephone Encounter (Signed)
Patient advised. Verbalized understanding

## 2022-05-19 ENCOUNTER — Ambulatory Visit (INDEPENDENT_AMBULATORY_CARE_PROVIDER_SITE_OTHER): Payer: BC Managed Care – PPO | Admitting: Physician Assistant

## 2022-05-19 ENCOUNTER — Encounter: Payer: Self-pay | Admitting: Physician Assistant

## 2022-05-19 VITALS — BP 125/80 | HR 54 | Ht 70.0 in | Wt 177.5 lb

## 2022-05-19 DIAGNOSIS — G4700 Insomnia, unspecified: Secondary | ICD-10-CM | POA: Diagnosis not present

## 2022-05-19 DIAGNOSIS — D509 Iron deficiency anemia, unspecified: Secondary | ICD-10-CM | POA: Diagnosis not present

## 2022-05-19 DIAGNOSIS — J302 Other seasonal allergic rhinitis: Secondary | ICD-10-CM | POA: Diagnosis not present

## 2022-05-19 DIAGNOSIS — E7800 Pure hypercholesterolemia, unspecified: Secondary | ICD-10-CM

## 2022-05-19 DIAGNOSIS — Z0000 Encounter for general adult medical examination without abnormal findings: Secondary | ICD-10-CM | POA: Diagnosis not present

## 2022-05-19 DIAGNOSIS — J301 Allergic rhinitis due to pollen: Secondary | ICD-10-CM | POA: Diagnosis not present

## 2022-05-19 DIAGNOSIS — J0100 Acute maxillary sinusitis, unspecified: Secondary | ICD-10-CM | POA: Diagnosis not present

## 2022-05-19 NOTE — Assessment & Plan Note (Signed)
Manages with ambien nightly, has been taking for 15+ years

## 2022-05-19 NOTE — Assessment & Plan Note (Signed)
Pt no longer taking a supplement Repeat cbc and iron panel

## 2022-05-19 NOTE — Assessment & Plan Note (Signed)
Pt manages with zetia 10 mg, h/o lower extremity pains and sleep disturbance with lovastatin The 10-year ASCVD risk score (Arnett DK, et al., 2019) is: 11.7%

## 2022-05-19 NOTE — Progress Notes (Signed)
I,Sha'taria Tyson,acting as a Education administrator for [employer], PA-C.,have documented all relevant documentation on the behalf of [name], PA-C,as directed by  [name], PA-C while in the presence of [name], PA-C.   Complete physical exam   Patient: Dale Gray   DOB: [birth year]   67 y.o. Male  MRN: [id number] Visit Date: 05/19/2022  Today's healthcare provider: [name], PA-C   Cc. cpe  Subjective    Dale Gray is a 67 y.o. male who presents today for a complete physical exam.  He reports consuming a general diet. The patient has a physically strenuous job, but has no regular exercise apart from work.  He generally feels well. He reports sleeping well. He does not have additional problems to discuss today.   Past Medical History:  Diagnosis Date   Complication of anesthesia    hard time waking up   GERD (gastroesophageal reflux disease)    Hemorrhoid    Hyperlipidemia    Joint pain    PONV (postoperative nausea and vomiting)    after 1st ear surgery   Prostate cancer ([place])    Reflux    Seasonal allergies    Past Surgical History:  Procedure Laterality Date   ANAL FISSURE REPAIR  2014   CARPAL TUNNEL RELEASE Right    and elbow surgery   COLONOSCOPY WITH PROPOFOL N/A 03/19/2017   Procedure: COLONOSCOPY WITH PROPOFOL;  Surgeon: Virgel Manifold, MD;  Location: [facility] ENDOSCOPY;  Service: Endoscopy;  Laterality: N/A;   COLONOSCOPY WITH PROPOFOL N/A 08/20/2020   Procedure: COLONOSCOPY WITH PROPOFOL;  Surgeon: Lucilla Lame, MD;  Location: [facility] ENDOSCOPY;  Service: Endoscopy;  Laterality: N/A;   ESOPHAGOGASTRODUODENOSCOPY (EGD) WITH PROPOFOL N/A 08/20/2020   Procedure: ESOPHAGOGASTRODUODENOSCOPY (EGD) WITH PROPOFOL;  Surgeon: Lucilla Lame, MD;  Location: [facility] ENDOSCOPY;  Service: Endoscopy;  Laterality: N/A;   IR RADIOLOGIST EVAL & MGMT  07/21/2021   IR SINUS/FIST TUBE CHK-NON GI  07/25/2021   IR SINUS/FIST TUBE CHK-NON GI  08/01/2021    PELVIC LYMPH NODE DISSECTION Bilateral 12/02/2020   Procedure: PELVIC LYMPH NODE DISSECTION;  Surgeon: [name], MD;  Location: [facility] ORS;  Service: Urology;  Laterality: Bilateral;   ROBOT ASSISTED LAPAROSCOPIC RADICAL PROSTATECTOMY N/A 12/02/2020   Procedure: XI ROBOTIC ASSISTED LAPAROSCOPIC RADICAL PROSTATECTOMY;  Surgeon: [name], MD;  Location: [facility] ORS;  Service: Urology;  Laterality: N/A;   TYMPANOPLASTY Left 04/30/2014   TYMPANOPLASTY  10/2014   VASECTOMY  01/06/2002   Social History   Socioeconomic History   Marital status: Married    Spouse name: Not on file   Number of children: Not on file   Years of education: Not on file   Highest education level: Not on file  Occupational History   Not on file  Tobacco Use   Smoking status: Never   Smokeless tobacco: Never  Vaping Use   Vaping Use: Never used  Substance and Sexual Activity   Alcohol use: No   Drug use: No   Sexual activity: Not on file  Other Topics Concern   Not on file  Social History Narrative   Not on file   Social Determinants of Health   Financial Resource Strain: Not on file  Food Insecurity: Not on file  Transportation Needs: Not on file  Physical Activity: Not on file  Stress: Not on file  Social Connections: Not on file  Intimate Partner Violence: Not on file   Family Status  Relation Name Status   Mother  Alive   Father  Alive   Sister  Alive   Brother  Alive   Daughter  Alive   Daughter  Alive   Mat Aunt  (Not Specified)   Administrator  (Not Specified)   Neg Hx  (Not Specified)   Family History  Problem Relation Age of Onset   Heart disease Mother    Asthma Mother    Heart disease Father    CAD Father    Healthy Sister    Healthy Brother    Healthy Daughter    Healthy Daughter    Diabetes Maternal Aunt    Diabetes Maternal Uncle    Colon cancer Neg Hx    Allergies  Allergen Reactions   Lovastatin Other (See Comments)    Sleep disturbance and extremity  pains Sleep disturbance and extremity pains    Patient Care Team: [name], PA-C as PCP - General (Physician Assistant) [name], MD as Consulting Physician (Urology) Noreene Filbert, MD as Consulting Physician (Radiation Oncology)   Medications: Outpatient Medications Prior to Visit  Medication Sig   aspirin 81 MG tablet Take 81 mg by mouth daily.   cetirizine (ZYRTEC) 10 MG tablet Take 1 tablet (10 mg total) by mouth daily.   ezetimibe (ZETIA) 10 MG tablet TAKE 1 TABLET DAILY   fish oil-omega-3 fatty acids 1000 MG capsule Take 1 g by mouth daily.   Multiple Vitamins-Minerals (MULTIVITAMIN WITH MINERALS) tablet Take 1 tablet by mouth daily.   omeprazole (PRILOSEC) 40 MG capsule TAKE 1 CAPSULE(40 MG) BY MOUTH DAILY   pregabalin (LYRICA) 50 MG capsule TAKE 1 CAPSULE(50 MG) BY MOUTH THREE TIMES DAILY   tadalafil (CIALIS) 5 MG tablet Take 1 tablet (5 mg total) by mouth daily as needed for erectile dysfunction.   zolpidem (AMBIEN CR) 12.5 MG CR tablet TAKE 1 TABLET(12.5 MG) BY MOUTH AT BEDTIME AS NEEDED   azelastine (ASTELIN) 0.1 % nasal spray Place 1 spray into both nostrils 2 (two) times daily. Use in each nostril as directed (Patient not taking: Reported on 05/19/2022)   [DISCONTINUED] acetaminophen (TYLENOL) 500 MG tablet Take 1,000 mg by mouth every 4 (four) hours as needed for mild pain. (Patient not taking: Reported on 05/19/2022)   [DISCONTINUED] benzonatate (TESSALON) 100 MG capsule Take 100 mg by mouth 3 (three) times daily. (Patient not taking: Reported on 05/19/2022)   [DISCONTINUED] fluticasone (FLONASE) 50 MCG/ACT nasal spray Place 2 sprays into both nostrils daily. (Patient not taking: Reported on 05/19/2022)   [DISCONTINUED] hydrocortisone (ANUSOL-HC) 2.5 % rectal cream Place 1 application. rectally 2 (two) times daily. (Patient not taking: Reported on 05/19/2022)   [DISCONTINUED] ibuprofen (ADVIL) 100 MG tablet Take 1,000 mg by mouth every 4 (four) hours as needed for  fever. (Patient not taking: Reported on 05/19/2022)   [DISCONTINUED] predniSONE (DELTASONE) 20 MG tablet Take 1 tablet (20 mg total) by mouth daily with breakfast. (Patient not taking: Reported on 05/19/2022)   No facility-administered medications prior to visit.    Review of Systems  HENT:  Positive for congestion, hearing loss, postnasal drip and sinus pressure.   Genitourinary:  Positive for frequency.    Objective    BP 125/80 (BP Location: Left Arm, Patient Position: Sitting, Cuff Size: Large)   Pulse (!) 54   Ht '5\' 10"'$  (1.778 m)   Wt 177 lb 8 oz (80.5 kg)   SpO2 100%   BMI 25.47 kg/m    Physical Exam Constitutional:      General: He is awake.  Appearance: He is well-developed.  HENT:     Head: Normocephalic.     Right Ear: Tympanic membrane, ear canal and external ear normal.     Left Ear: Tympanic membrane, ear canal and external ear normal.     Nose: Nose normal. No congestion or rhinorrhea.     Mouth/Throat:     Mouth: Mucous membranes are moist.     Pharynx: No oropharyngeal exudate or posterior oropharyngeal erythema.  Eyes:     Pupils: Pupils are equal, round, and reactive to light.  Cardiovascular:     Rate and Rhythm: Normal rate and regular rhythm.     Heart sounds: Normal heart sounds.  Pulmonary:     Effort: Pulmonary effort is normal.     Breath sounds: Normal breath sounds.  Musculoskeletal:     Cervical back: Normal range of motion.     Right lower leg: No edema.     Left lower leg: No edema.  Lymphadenopathy:     Cervical: No cervical adenopathy.  Skin:    General: Skin is warm.  Neurological:     Mental Status: He is alert and oriented to person, place, and time.  Psychiatric:        Attention and Perception: Attention normal.        Mood and Affect: Mood normal.        Speech: Speech normal.        Behavior: Behavior normal. Behavior is cooperative.     Last depression screening scores    05/19/2022    9:07 AM 05/14/2021    8:45 AM  05/10/2020   10:18 AM  PHQ 2/9 Scores  PHQ - 2 Score 0 0 0  PHQ- 9 Score 0 2 3   Last fall risk screening    05/19/2022    9:07 AM  Fall Risk   Falls in the past year? 0  Number falls in past yr: 0  Injury with Fall? 0  Risk for fall due to : No Fall Risks  Follow up Falls evaluation completed   Last Audit-C alcohol use screening    05/19/2022    9:07 AM  Alcohol Use Disorder Test (AUDIT)  1. How often do you have a drink containing alcohol? 0  2. How many drinks containing alcohol do you have on a typical day when you are drinking? 0  3. How often do you have six or more drinks on one occasion? 0  AUDIT-C Score 0   A score of 3 or more in women, and 4 or more in men indicates increased risk for alcohol abuse, EXCEPT if all of the points are from question 1   No results found for any visits on 05/19/22.  Assessment & Plan    Routine Health Maintenance and Physical Exam  Exercise Activities and Dietary recommendations --balanced diet high in fiber and protein, low in sugars, carbs, fats. --physical activity/exercise 30 minutes 3-5 times a week     Immunization History  Administered Date(s) Administered   Influenza,inj,Quad PF,6+ Mos 12/13/2015, 11/26/2016, 01/25/2018, 01/03/2019, 01/04/2020, 12/03/2020   PFIZER(Purple Top)SARS-COV-2 Vaccination 11/07/2019, 12/05/2019   Td 02/18/2017   Tdap 05/26/2006   Zoster Recombinat (Shingrix) 03/10/2018, 05/09/2018    Health Maintenance  Topic Date Due   COVID-19 Vaccine (3 - Pfizer risk series) 01/02/2020   INFLUENZA VACCINE  10/07/2021   COLONOSCOPY (Pts 45-92yr Insurance coverage will need to be confirmed)  08/20/2025   DTaP/Tdap/Td (3 - Td or Tdap) 02/19/2027   Hepatitis  C Screening  Completed   HIV Screening  Completed   Zoster Vaccines- Shingrix  Completed   HPV VACCINES  Aged Out    Discussed health benefits of physical activity, and encouraged him to engage in regular exercise appropriate for his age and  condition.  Problem List Items Addressed This Visit       Other   HLD (hyperlipidemia)    Pt manages with zetia 10 mg, h/o lower extremity pains and sleep disturbance with lovastatin The 10-year ASCVD risk score (Arnett DK, et al., 2019) is: 11.7%       Relevant Orders   Lipid panel   Anemia, iron deficiency    Pt no longer taking a supplement Repeat cbc and iron panel      Relevant Orders   CBC w/Diff/Platelet   Iron, TIBC and Ferritin Panel   Insomnia    Manages with ambien nightly, has been taking for 15+ years       Other Visit Diagnoses     Annual physical exam    -  Primary   Relevant Orders   CBC w/Diff/Platelet   Lipid panel   Comprehensive Metabolic Panel (CMET)        Return in about 1 year (around 05/19/2023) for CPE.     I, [name], PA-C have reviewed all documentation for this visit. The documentation on  05/19/22  for the exam, diagnosis, procedures, and orders are all accurate and complete.  [name], PA-C [facility] [address] [place], [place], [address] Office: [phone] Fax: [place]

## 2022-05-20 LAB — COMPREHENSIVE METABOLIC PANEL
ALT: 15 IU/L (ref 0–44)
AST: 16 IU/L (ref 0–40)
Albumin/Globulin Ratio: 1.7 (ref 1.2–2.2)
Albumin: 4.3 g/dL (ref 3.9–4.9)
Alkaline Phosphatase: 95 IU/L (ref 44–121)
BUN/Creatinine Ratio: 12 (ref 10–24)
BUN: 14 mg/dL (ref 8–27)
Bilirubin Total: 0.5 mg/dL (ref 0.0–1.2)
CO2: 22 mmol/L (ref 20–29)
Calcium: 9.3 mg/dL (ref 8.6–10.2)
Chloride: 106 mmol/L (ref 96–106)
Creatinine, Ser: 1.17 mg/dL (ref 0.76–1.27)
Globulin, Total: 2.5 g/dL (ref 1.5–4.5)
Glucose: 85 mg/dL (ref 70–99)
Potassium: 4.8 mmol/L (ref 3.5–5.2)
Sodium: 142 mmol/L (ref 134–144)
Total Protein: 6.8 g/dL (ref 6.0–8.5)
eGFR: 70 mL/min/{1.73_m2} (ref >59)

## 2022-05-20 LAB — CBC WITH DIFFERENTIAL/PLATELET
Basophils Absolute: 0 10*3/uL (ref 0.0–0.2)
Basos: 1 %
EOS (ABSOLUTE): 0.1 10*3/uL (ref 0.0–0.4)
Eos: 1 %
Hematocrit: 40.8 % (ref 37.5–51.0)
Hemoglobin: 13 g/dL (ref 13.0–17.7)
Immature Grans (Abs): 0.1 10*3/uL (ref 0.0–0.1)
Immature Granulocytes: 1 %
Lymphocytes Absolute: 0.9 10*3/uL (ref 0.7–3.1)
Lymphs: 12 %
MCH: 29 pg (ref 26.6–33.0)
MCHC: 31.9 g/dL (ref 31.5–35.7)
MCV: 91 fL (ref 79–97)
Monocytes Absolute: 0.8 10*3/uL (ref 0.1–0.9)
Monocytes: 11 %
Neutrophils Absolute: 5.6 10*3/uL (ref 1.4–7.0)
Neutrophils: 74 %
Platelets: 327 10*3/uL (ref 150–450)
RBC: 4.49 x10E6/uL (ref 4.14–5.80)
RDW: 13.2 % (ref 11.6–15.4)
WBC: 7.4 10*3/uL (ref 3.4–10.8)

## 2022-05-20 LAB — LIPID PANEL
Chol/HDL Ratio: 4.5 ratio (ref 0.0–5.0)
Cholesterol, Total: 188 mg/dL (ref 100–199)
HDL: 42 mg/dL (ref >39)
LDL Chol Calc (NIH): 116 mg/dL — ABNORMAL HIGH (ref 0–99)
Triglycerides: 171 mg/dL — ABNORMAL HIGH (ref 0–149)
VLDL Cholesterol Cal: 30 mg/dL (ref 5–40)

## 2022-05-20 LAB — IRON,TIBC AND FERRITIN PANEL
Ferritin: 97 ng/mL (ref 30–400)
Iron Saturation: 36 % (ref 15–55)
Iron: 107 ug/dL (ref 38–169)
Total Iron Binding Capacity: 299 ug/dL (ref 250–450)
UIBC: 192 ug/dL (ref 111–343)

## 2022-05-22 ENCOUNTER — Other Ambulatory Visit: Payer: Self-pay

## 2022-05-24 ENCOUNTER — Other Ambulatory Visit: Payer: Self-pay | Admitting: Urology

## 2022-05-26 ENCOUNTER — Other Ambulatory Visit: Payer: BC Managed Care – PPO

## 2022-05-26 DIAGNOSIS — R188 Other ascites: Secondary | ICD-10-CM

## 2022-05-26 DIAGNOSIS — C61 Malignant neoplasm of prostate: Secondary | ICD-10-CM

## 2022-05-27 LAB — PSA: Prostate Specific Ag, Serum: <0.1 ng/mL (ref 0.0–4.0)

## 2022-06-02 ENCOUNTER — Ambulatory Visit: Payer: BC Managed Care – PPO | Admitting: Urology

## 2022-06-09 ENCOUNTER — Ambulatory Visit: Payer: BC Managed Care – PPO | Admitting: Urology

## 2022-06-09 VITALS — BP 127/78 | HR 51 | Ht 70.0 in | Wt 177.0 lb

## 2022-06-09 DIAGNOSIS — C61 Malignant neoplasm of prostate: Secondary | ICD-10-CM | POA: Diagnosis not present

## 2022-06-09 DIAGNOSIS — N5231 Erectile dysfunction following radical prostatectomy: Secondary | ICD-10-CM

## 2022-06-09 DIAGNOSIS — N393 Stress incontinence (female) (male): Secondary | ICD-10-CM

## 2022-06-09 NOTE — Progress Notes (Signed)
I, Amy L Pierron,acting as a scribe for [name], MD.,have documented all relevant documentation on the behalf of [name], MD,as directed by  [name], MD while in the presence of [name], MD.  06/09/2022 10:19 AM   Dale Gray Aug 17, 1955 [id number]  Referring provider: Eulas Post, MD [address] [place],   [id number]  Chief Complaint  Patient presents with   Follow-up    Prostate cancer [facility])    HPI: 67 year-old male with a complex personal history returns today for routine six month follow-up.   He has a history of unfavorable intermediate risk prostate cancer with PSA persistence (+SV, +margin left base; pT3) status post prostatectomy, and ultimately salvage radiation with six months of Orgovix. This postoperative course was complicated by pelvic fluid collection, requiring percutaneous drainage, and ultimately this issue resolved. His PSA is undetectable as of 05/26/2022. He's been off of Eligard for about six months. He had a follow-up with Interventional Radiology at [facility] in October 2023 with a CT abdomen pelvis with contrast that indicated interval resolution of his pelvic abscess.  Also, he has a personal history of erectile dysfunction and stress urinary incontinence.   He reports no longer having hot flashes. He still experiences erectile dysfunction and incontinence. He is looking forward to pursing solutions to these problems in June since he will be 1 year out from radiation.   PMH: Past Medical History:  Diagnosis Date   Complication of anesthesia    hard time waking up   GERD (gastroesophageal reflux disease)    Hemorrhoid    Hyperlipidemia    Joint pain    PONV (postoperative nausea and vomiting)    after 1st ear surgery   Prostate cancer ([place])    Reflux    Seasonal allergies     Surgical History: Past Surgical History:  Procedure Laterality Date   ANAL FISSURE REPAIR  2014   CARPAL TUNNEL RELEASE Right     and elbow surgery   COLONOSCOPY WITH PROPOFOL N/A 03/19/2017   Procedure: COLONOSCOPY WITH PROPOFOL;  Surgeon: Virgel Manifold, MD;  Location: [facility] ENDOSCOPY;  Service: Endoscopy;  Laterality: N/A;   COLONOSCOPY WITH PROPOFOL N/A 08/20/2020   Procedure: COLONOSCOPY WITH PROPOFOL;  Surgeon: Lucilla Lame, MD;  Location: [facility] ENDOSCOPY;  Service: Endoscopy;  Laterality: N/A;   ESOPHAGOGASTRODUODENOSCOPY (EGD) WITH PROPOFOL N/A 08/20/2020   Procedure: ESOPHAGOGASTRODUODENOSCOPY (EGD) WITH PROPOFOL;  Surgeon: Lucilla Lame, MD;  Location: [facility] ENDOSCOPY;  Service: Endoscopy;  Laterality: N/A;   IR RADIOLOGIST EVAL & MGMT  07/21/2021   IR SINUS/FIST TUBE CHK-NON GI  07/25/2021   IR SINUS/FIST TUBE CHK-NON GI  08/01/2021   PELVIC LYMPH NODE DISSECTION Bilateral 12/02/2020   Procedure: PELVIC LYMPH NODE DISSECTION;  Surgeon: [name], MD;  Location: [facility] ORS;  Service: Urology;  Laterality: Bilateral;   ROBOT ASSISTED LAPAROSCOPIC RADICAL PROSTATECTOMY N/A 12/02/2020   Procedure: XI ROBOTIC ASSISTED LAPAROSCOPIC RADICAL PROSTATECTOMY;  Surgeon: [name], MD;  Location: [facility] ORS;  Service: Urology;  Laterality: N/A;   TYMPANOPLASTY Left 04/30/2014   TYMPANOPLASTY  10/2014   VASECTOMY  01/06/2002    Home Medications:  Allergies as of 06/09/2022       Reactions   Lovastatin Other (See Comments)   Sleep disturbance and extremity pains Sleep disturbance and extremity pains        Medication List        Accurate as of June 09, 2022 10:19 AM. If you have any questions, ask  your nurse or doctor.          STOP taking these medications    azelastine 0.1 % nasal spray Commonly known as: ASTELIN   cetirizine 10 MG tablet Commonly known as: ZYRTEC       TAKE these medications    amoxicillin-clavulanate 875-125 MG tablet Commonly known as: AUGMENTIN SMARTSIG:1 Tablet(s) By Mouth Every 12 Hours   aspirin 81 MG tablet Take 81 mg by mouth daily.   ezetimibe 10 MG  tablet Commonly known as: ZETIA TAKE 1 TABLET DAILY   fish oil-omega-3 fatty acids 1000 MG capsule Take 1 g by mouth daily.   multivitamin with minerals tablet Take 1 tablet by mouth daily.   omeprazole 40 MG capsule Commonly known as: PRILOSEC TAKE 1 CAPSULE(40 MG) BY MOUTH DAILY   predniSONE 10 MG (48) Tbpk tablet Commonly known as: STERAPRED UNI-PAK 48 TAB Take by mouth as directed.   pregabalin 50 MG capsule Commonly known as: LYRICA TAKE 1 CAPSULE(50 MG) BY MOUTH THREE TIMES DAILY   tadalafil 5 MG tablet Commonly known as: CIALIS TAKE 1 TABLET(5 MG) BY MOUTH DAILY AS NEEDED FOR ERECTILE DYSFUNCTION   zolpidem 12.5 MG CR tablet Commonly known as: AMBIEN CR TAKE 1 TABLET(12.5 MG) BY MOUTH AT BEDTIME AS NEEDED        Allergies:  Allergies  Allergen Reactions   Lovastatin Other (See Comments)    Sleep disturbance and extremity pains Sleep disturbance and extremity pains    Family History: Family History  Problem Relation Age of Onset   Heart disease Mother    Asthma Mother    Heart disease Father    CAD Father    Healthy Sister    Healthy Brother    Healthy Daughter    Healthy Daughter    Diabetes Maternal Aunt    Diabetes Maternal Uncle    Colon cancer Neg Hx     Social History:  reports that he has never smoked. He has never used smokeless tobacco. He reports that he does not drink alcohol and does not use drugs.   Physical Exam: BP 127/78   Pulse (!) 51   Ht 5\' 10"  (1.778 m)   Wt 177 lb (80.3 kg)   BMI 25.40 kg/m   Constitutional:  Alert and oriented, No acute distress. HEENT: [place] AT, moist mucus membranes.  Trachea midline, no masses. Neurologic: Grossly intact, no focal deficits, moving all 4 extremities. Psychiatric: Normal mood and affect.   Assessment & Plan:    1. Prostate cancer ([place])  - PSA is undetectable.  - Will continue to check his PSA every 6 months.   2. SUI (stress urinary incontinence), male  - Severe. June is 1 year  since completion of radiation so will give a referral to [facility] to start process for sphincter.    3. Erectile dysfunction after radical prostatectomy  - Will discuss a prosthesis when he goes to [facility] soon. Not sure he wants to get this.  Return in about 6 months (around 12/09/2022) for PSA labs only, visit in 1 year.  [place] [address], [place], [place] [address] [phone]

## 2022-06-11 DIAGNOSIS — J302 Other seasonal allergic rhinitis: Secondary | ICD-10-CM | POA: Diagnosis not present

## 2022-06-11 DIAGNOSIS — J0100 Acute maxillary sinusitis, unspecified: Secondary | ICD-10-CM | POA: Diagnosis not present

## 2022-07-13 ENCOUNTER — Other Ambulatory Visit: Payer: Self-pay | Admitting: Physician Assistant

## 2022-07-13 DIAGNOSIS — J328 Other chronic sinusitis: Secondary | ICD-10-CM | POA: Diagnosis not present

## 2022-07-13 DIAGNOSIS — J302 Other seasonal allergic rhinitis: Secondary | ICD-10-CM | POA: Diagnosis not present

## 2022-07-13 DIAGNOSIS — F5101 Primary insomnia: Secondary | ICD-10-CM

## 2022-07-13 DIAGNOSIS — H6522 Chronic serous otitis media, left ear: Secondary | ICD-10-CM | POA: Diagnosis not present

## 2022-07-14 ENCOUNTER — Other Ambulatory Visit: Payer: Self-pay | Admitting: Physician Assistant

## 2022-07-14 ENCOUNTER — Telehealth: Payer: Self-pay | Admitting: Physician Assistant

## 2022-07-14 DIAGNOSIS — F5101 Primary insomnia: Secondary | ICD-10-CM

## 2022-07-14 MED ORDER — ZOLPIDEM TARTRATE ER 12.5 MG PO TBCR: EXTENDED_RELEASE_TABLET | ORAL | 0 refills | Status: DC

## 2022-07-14 NOTE — Telephone Encounter (Signed)
Patient's zolpidem is going to take at least 7 days to come in from mail order pharmacy. He wanted to know if you could send in 7 tablets to last him until mail order comes?  [facility] [address] [facility]

## 2022-07-15 NOTE — Telephone Encounter (Signed)
sent 

## 2022-07-30 DIAGNOSIS — J324 Chronic pansinusitis: Secondary | ICD-10-CM | POA: Diagnosis not present

## 2022-07-30 DIAGNOSIS — J328 Other chronic sinusitis: Secondary | ICD-10-CM | POA: Diagnosis not present

## 2022-09-02 DIAGNOSIS — N529 Male erectile dysfunction, unspecified: Secondary | ICD-10-CM | POA: Diagnosis not present

## 2022-09-02 DIAGNOSIS — N393 Stress incontinence (female) (male): Secondary | ICD-10-CM | POA: Diagnosis not present

## 2022-09-03 ENCOUNTER — Other Ambulatory Visit: Payer: Self-pay | Admitting: Family Medicine

## 2022-09-03 NOTE — Telephone Encounter (Signed)
Requested Prescriptions  Pending Prescriptions Disp Refills   ezetimibe (ZETIA) 10 MG tablet [Pharmacy Med Name: EZETIMIBE TABS 10MG ] 90 tablet 3    Sig: TAKE 1 TABLET DAILY     Cardiovascular:  Antilipid - Sterol Transport Inhibitors Failed - 09/03/2022 12:56 AM      Failed - Lipid Panel in normal range within the last 12 months    Cholesterol, Total  Date Value Ref Range Status  05/19/2022 188 100 - 199 mg/dL Final   LDL Cholesterol (Calc)  Date Value Ref Range Status  02/18/2017 130 (H) mg/dL (calc) Final    Comment:    Reference range: <100 . Desirable range <100 mg/dL for primary prevention;   <70 mg/dL for patients with CHD or diabetic patients  with > or = 2 CHD risk factors. Marland Kitchen LDL-C is now calculated using the Martin-Hopkins  calculation, which is a validated novel method providing  better accuracy than the Friedewald equation in the  estimation of LDL-C.  Horald Pollen et al. Lenox Ahr. 1610;960(45): 2061-2068  (http://education.[employer].com/faq/FAQ164)    LDL Chol Calc (NIH)  Date Value Ref Range Status  05/19/2022 116 (H) 0 - 99 mg/dL Final   HDL  Date Value Ref Range Status  05/19/2022 42 >39 mg/dL Final   Triglycerides  Date Value Ref Range Status  05/19/2022 171 (H) 0 - 149 mg/dL Final         Passed - AST in normal range and within 360 days    AST  Date Value Ref Range Status  05/19/2022 16 0 - 40 IU/L Final         Passed - ALT in normal range and within 360 days    ALT  Date Value Ref Range Status  05/19/2022 15 0 - 44 IU/L Final         Passed - Patient is not pregnant      Passed - Valid encounter within last 12 months    Recent Outpatient Visits           3 months ago Annual physical exam   [place] [facility] Alfredia Ferguson, PA-C   4 months ago Nasal congestion   [facility] [place] Edwards, [place], PA-C   10 months ago Perforation of left tympanic membrane   [place] [facility] Malva Limes, MD   10 months ago Chronic fatigue    [facility] Bosie Clos, MD   1 year ago Seasonal allergic rhinitis due to pollen   [facility] Bosie Clos, MD       Future Appointments             In 2 weeks Deirdre Evener, MD [facility]   In 9 months Vanna Scotland, MD [facility] [place]

## 2022-09-15 DIAGNOSIS — H6983 Other specified disorders of Eustachian tube, bilateral: Secondary | ICD-10-CM | POA: Diagnosis not present

## 2022-09-15 DIAGNOSIS — J328 Other chronic sinusitis: Secondary | ICD-10-CM | POA: Diagnosis not present

## 2022-09-17 ENCOUNTER — Ambulatory Visit: Payer: BC Managed Care – PPO | Admitting: Dermatology

## 2022-09-17 VITALS — BP 128/73

## 2022-09-17 DIAGNOSIS — L814 Other melanin hyperpigmentation: Secondary | ICD-10-CM

## 2022-09-17 DIAGNOSIS — W908XXA Exposure to other nonionizing radiation, initial encounter: Secondary | ICD-10-CM

## 2022-09-17 DIAGNOSIS — L729 Follicular cyst of the skin and subcutaneous tissue, unspecified: Secondary | ICD-10-CM

## 2022-09-17 DIAGNOSIS — L821 Other seborrheic keratosis: Secondary | ICD-10-CM | POA: Diagnosis not present

## 2022-09-17 DIAGNOSIS — L570 Actinic keratosis: Secondary | ICD-10-CM

## 2022-09-17 DIAGNOSIS — L578 Other skin changes due to chronic exposure to nonionizing radiation: Secondary | ICD-10-CM

## 2022-09-17 DIAGNOSIS — D229 Melanocytic nevi, unspecified: Secondary | ICD-10-CM

## 2022-09-17 DIAGNOSIS — Z1283 Encounter for screening for malignant neoplasm of skin: Secondary | ICD-10-CM

## 2022-09-17 DIAGNOSIS — D692 Other nonthrombocytopenic purpura: Secondary | ICD-10-CM

## 2022-09-17 DIAGNOSIS — D1801 Hemangioma of skin and subcutaneous tissue: Secondary | ICD-10-CM

## 2022-09-17 DIAGNOSIS — D492 Neoplasm of unspecified behavior of bone, soft tissue, and skin: Secondary | ICD-10-CM

## 2022-09-17 DIAGNOSIS — Z872 Personal history of diseases of the skin and subcutaneous tissue: Secondary | ICD-10-CM

## 2022-09-17 DIAGNOSIS — L720 Epidermal cyst: Secondary | ICD-10-CM

## 2022-09-17 DIAGNOSIS — L918 Other hypertrophic disorders of the skin: Secondary | ICD-10-CM

## 2022-09-17 HISTORY — DX: Actinic keratosis: L57.0

## 2022-09-17 NOTE — Patient Instructions (Addendum)
Wound Care Instructions  Cleanse wound gently with soap and water once a day then pat dry with clean gauze. Apply a thin coat of Petrolatum (petroleum jelly, "Vaseline") over the wound (unless you have an allergy to this). We recommend that you use a new, sterile tube of Vaseline. Do not pick or remove scabs. Do not remove the yellow or white "healing tissue" from the base of the wound.  Cover the wound with fresh, clean, nonstick gauze and secure with paper tape. You may use Band-Aids in place of gauze and tape if the wound is small enough, but would recommend trimming much of the tape off as there is often too much. Sometimes Band-Aids can irritate the skin.  You should call the office for your biopsy report after 1 week if you have not already been contacted.  If you experience any problems, such as abnormal amounts of bleeding, swelling, significant bruising, significant pain, or evidence of infection, please call the office immediately.  FOR ADULT SURGERY PATIENTS: If you need something for pain relief you may take 1 extra strength Tylenol (acetaminophen) AND 2 Ibuprofen (200mg each) together every 4 hours as needed for pain. (do not take these if you are allergic to them or if you have a reason you should not take them.) Typically, you may only need pain medication for 1 to 3 days.      Cryotherapy Aftercare  Wash gently with soap and water everyday.   Apply Vaseline and Band-Aid daily until healed.      Due to recent changes in healthcare laws, you may see results of your pathology and/or laboratory studies on MyChart before the doctors have had a chance to review them. We understand that in some cases there may be results that are confusing or concerning to you. Please understand that not all results are received at the same time and often the doctors may need to interpret multiple results in order to provide you with the best plan of care or course of treatment. Therefore, we ask that  you please give us 2 business days to thoroughly review all your results before contacting the office for clarification. Should we see a critical lab result, you will be contacted sooner.   If You Need Anything After Your Visit  If you have any questions or concerns for your doctor, please call our main line at [phone] and press option 4 to reach your doctor's medical assistant. If no one answers, please leave a voicemail as directed and we will return your call as soon as possible. Messages left after 4 pm will be answered the following business day.   You may also send us a message via MyChart. We typically respond to MyChart messages within 1-2 business days.  For prescription refills, please ask your pharmacy to contact our office. Our fax number is [phone].  If you have an urgent issue when the clinic is closed that cannot wait until the next business day, you can page your doctor at the number below.    Please note that while we do our best to be available for urgent issues outside of office hours, we are not available 24/7.   If you have an urgent issue and are unable to reach us, you may choose to seek medical care at your doctor's office, retail clinic, urgent care center, or emergency room.  If you have a medical emergency, please immediately call 911 or go to the emergency department.  Pager Numbers  - Dr.   Kowalski: [phone]  - Dr. Moye: [phone]  - Dr. Stewart: [phone]  In the event of inclement weather, please call our main line at [phone] for an update on the status of any delays or closures.  Dermatology Medication Tips: Please keep the boxes that topical medications come in in order to help keep track of the instructions about where and how to use these. Pharmacies typically print the medication instructions only on the boxes and not directly on the medication tubes.   If your medication is too expensive, please contact our office at  [phone] option 4 or send us a message through MyChart.   We are unable to tell what your co-pay for medications will be in advance as this is different depending on your insurance coverage. However, we may be able to find a substitute medication at lower cost or fill out paperwork to get insurance to cover a needed medication.   If a prior authorization is required to get your medication covered by your insurance company, please allow us 1-2 business days to complete this process.  Drug prices often vary depending on where the prescription is filled and some pharmacies may offer cheaper prices.  The website www.goodrx.com contains coupons for medications through different pharmacies. The prices here do not account for what the cost may be with help from insurance (it may be cheaper with your insurance), but the website can give you the price if you did not use any insurance.  - You can print the associated coupon and take it with your prescription to the pharmacy.  - You may also stop by our office during regular business hours and pick up a GoodRx coupon card.  - If you need your prescription sent electronically to a different pharmacy, notify our office through  MyChart or by phone at [phone] option 4.     Si Usted Necesita Algo Despus de Su Visita  Tambin puede enviarnos un mensaje a travs de MyChart. Por lo general respondemos a los mensajes de MyChart en el transcurso de 1 a 2 das hbiles.  Para renovar recetas, por favor pida a su farmacia que se ponga en contacto con nuestra oficina. Nuestro nmero de fax es el [phone].  Si tiene un asunto urgente cuando la clnica est cerrada y que no puede esperar hasta el siguiente da hbil, puede llamar/localizar a su doctor(a) al nmero que aparece a continuacin.   Por favor, tenga en cuenta que aunque hacemos todo lo posible para estar disponibles para asuntos urgentes fuera del horario de oficina, no estamos  disponibles las 24 horas del da, los 7 das de la semana.   Si tiene un problema urgente y no puede comunicarse con nosotros, puede optar por buscar atencin mdica  en el consultorio de su doctor(a), en una clnica privada, en un centro de atencin urgente o en una sala de emergencias.  Si tiene una emergencia mdica, por favor llame inmediatamente al 911 o vaya a la sala de emergencias.  Nmeros de bper  - Dr. Kowalski: [phone]  - Dra. Moye: [phone]  - Dra. Stewart: [phone]  En caso de inclemencias del tiempo, por favor llame a nuestra lnea principal al [phone] para una actualizacin sobre el estado de cualquier retraso o cierre.  Consejos para la medicacin en dermatologa: Por favor, guarde las cajas en las que vienen los medicamentos de uso tpico para ayudarle a seguir las instrucciones sobre dnde y cmo usarlos. Las farmacias generalmente imprimen las instrucciones del medicamento slo   en las cajas y no directamente en los tubos del medicamento.   Si su medicamento es muy caro, por favor, pngase en contacto con nuestra oficina llamando al [phone] y presione la opcin 4 o envenos un mensaje a travs de MyChart.   No podemos decirle cul ser su copago por los medicamentos por adelantado ya que esto es diferente dependiendo de la cobertura de su seguro. Sin embargo, es posible que podamos encontrar un medicamento sustituto a menor costo o llenar un formulario para que el seguro cubra el medicamento que se considera necesario.   Si se requiere una autorizacin previa para que su compaa de seguros cubra su medicamento, por favor permtanos de 1 a 2 das hbiles para completar este proceso.  Los precios de los medicamentos varan con frecuencia dependiendo del lugar de dnde se surte la receta y alguna farmacias pueden ofrecer precios ms baratos.  El sitio web www.goodrx.com tiene cupones para medicamentos de diferentes farmacias. Los precios aqu no  tienen en cuenta lo que podra costar con la ayuda del seguro (puede ser ms barato con su seguro), pero el sitio web puede darle el precio si no utiliz ningn seguro.  - Puede imprimir el cupn correspondiente y llevarlo con su receta a la farmacia.  - Tambin puede pasar por nuestra oficina durante el horario de atencin regular y recoger una tarjeta de cupones de GoodRx.  - Si necesita que su receta se enve electrnicamente a una farmacia diferente, informe a nuestra oficina a travs de MyChart de [facility] o por telfono llamando al [phone] y presione la opcin 4.  

## 2022-09-17 NOTE — Progress Notes (Signed)
Follow-Up Visit   Subjective  Dale Gray is a 67 y.o. male who presents for the following: Skin Cancer Screening and Upper Body Skin Exam, hx of AKs  The patient presents for Upper Body Skin Exam (UBSE) for skin cancer screening and mole check. The patient has spots, moles and lesions to be evaluated, some may be new or changing and the patient may have concern these could be cancer.    The following portions of the chart were reviewed this encounter and updated as appropriate: medications, allergies, medical history  Review of Systems:  No other skin or systemic complaints except as noted in HPI or Assessment and Plan.  Objective  Well appearing patient in no apparent distress; mood and affect are within normal limits.  All skin waist up examined. Relevant physical exam findings are noted in the Assessment and Plan.  L mid dorsal lat forearm Irregular dark brown macule 0.6cm  Scalp x 7 (7) Pink scaly macules    Assessment & Plan   Neoplasm of skin L mid dorsal lat forearm  Epidermal / dermal shaving  Lesion diameter (cm):  0.6 Informed consent: discussed and consent obtained   Timeout: patient name, date of birth, surgical site, and procedure verified   Procedure prep:  Patient was prepped and draped in usual sterile fashion Prep type:  Isopropyl alcohol Anesthesia: the lesion was anesthetized in a standard fashion   Anesthetic:  1% lidocaine w/ epinephrine 1-100,000 buffered w/ 8.4% NaHCO3 Instrument used: flexible razor blade   Hemostasis achieved with: pressure, aluminum chloride and electrodesiccation   Outcome: patient tolerated procedure well   Post-procedure details: sterile dressing applied and wound care instructions given   Dressing type: bandage and petrolatum    Specimen 1 - Surgical pathology Differential Diagnosis: D48.5 Nevus vs Dysplastic nevus  Check Margins: yes Irregular dark brown macule 0.6cm  AK (actinic keratosis) (7) Scalp x  7  ACTINIC DAMAGE - chronic, secondary to cumulative UV radiation exposure/sun exposure over time - diffuse scaly erythematous macules with underlying dyspigmentation - Recommend daily broad spectrum sunscreen SPF 30+ to sun-exposed areas, reapply every 2 hours as needed.  - Recommend staying in the shade or wearing long sleeves, sun glasses (UVA+UVB protection) and wide brim hats (4-inch brim around the entire circumference of the hat). - Call for new or changing lesions.   Destruction of lesion - Scalp x 7 (7) Complexity: simple   Destruction method: cryotherapy   Informed consent: discussed and consent obtained   Timeout:  patient name, date of birth, surgical site, and procedure verified Lesion destroyed using liquid nitrogen: Yes   Region frozen until ice ball extended beyond lesion: Yes   Outcome: patient tolerated procedure well with no complications   Post-procedure details: wound care instructions given     Skin cancer screening performed today.  Lentigines, Seborrheic Keratoses, Hemangiomas - Benign normal skin lesions - Benign-appearing - Call for any changes  Melanocytic Nevi - Tan-brown and/or pink-flesh-colored symmetric macules and papules - Benign appearing on exam today - Observation - Call clinic for new or changing moles - Recommend daily use of broad spectrum spf 30+ sunscreen to sun-exposed areas.   Acrochordons (Skin Tags) - Fleshy, skin-colored pedunculated papules - Benign appearing.  - Observe. - If desired, they can be removed with an in office procedure that is not covered by insurance. - Please call the clinic if you notice any new or changing lesions.  - axilla  Purpura - Chronic; persistent and recurrent.  Treatable, but not curable. - Violaceous macules and patches - Benign - Related to trauma, age, sun damage and/or use of blood thinners, chronic use of topical and/or oral steroids - Observe - Can use OTC arnica containing moisturizer such  as Dermend Bruise Formula if desired - Call for worsening or other concerns   Milia - tiny firm white papules - type of cyst - benign - may be extracted if symptomatic - observe  - infra ocular  Return in about 1 year (around 09/17/2023) for UBSE, Hx of AKs.  I, Ardis Rowan, RMA, am acting as scribe for Armida Sans, MD .   Documentation: I have reviewed the above documentation for accuracy and completeness, and I agree with the above.  Armida Sans, MD

## 2022-09-18 ENCOUNTER — Encounter: Payer: Self-pay | Admitting: Dermatology

## 2022-09-24 ENCOUNTER — Telehealth: Payer: Self-pay

## 2022-09-24 NOTE — Telephone Encounter (Addendum)
  Patient notified of bx results. Denied any questions.    ----- Message from Willeen Niece sent at 09/24/2022 11:22 AM EDT ----- Skin , left mid dorsal lat forearm ACTINIC KERATOSIS AND SEBORRHEIC KERATOSIS, PIGMENTED, INFLAMED  Precancer with benign ISK, cryotherapy if it recurs - please call patient

## 2022-09-24 NOTE — Telephone Encounter (Signed)
-----   Message from Willeen Niece sent at 09/24/2022 11:22 AM EDT ----- Skin , left mid dorsal lat forearm ACTINIC KERATOSIS AND SEBORRHEIC KERATOSIS, PIGMENTED, INFLAMED  Precancer with benign ISK, cryotherapy if it recurs - please call patient

## 2022-09-25 ENCOUNTER — Telehealth: Payer: Self-pay | Admitting: Family Medicine

## 2022-09-25 ENCOUNTER — Other Ambulatory Visit: Payer: Self-pay | Admitting: Family Medicine

## 2022-09-25 DIAGNOSIS — F5101 Primary insomnia: Secondary | ICD-10-CM

## 2022-09-25 IMAGING — CT CT GUIDANCE NEEDLE PLACEMENT
1 of 4 series · 12 of 32 positions shown, 18 images · non-contrast
Comparison: CT AP, earlier same day and 07/25/2021.

INDICATION: Recurrent pelvic fluid collection.

[Series 5: i-spiral 5.0 br38 · axial · 0.73mm/px · z∈[+586,+722]mm · 12 of 47 slices shown, 18 images]
[im 4/47  soft-tissue]
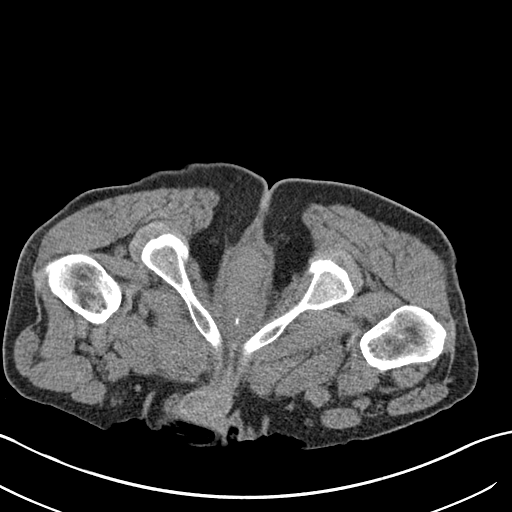
[im 4/47  bone]
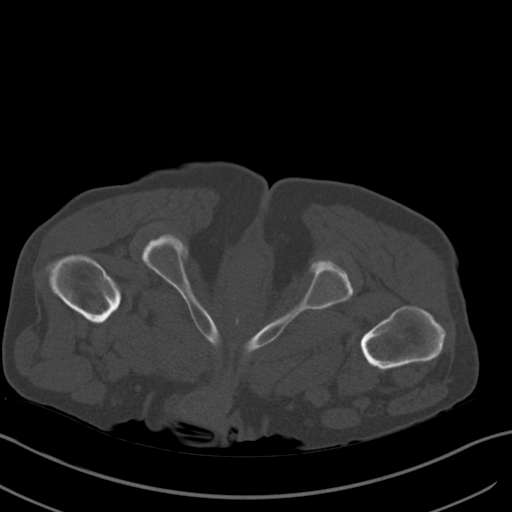
[im 8/47  soft-tissue]
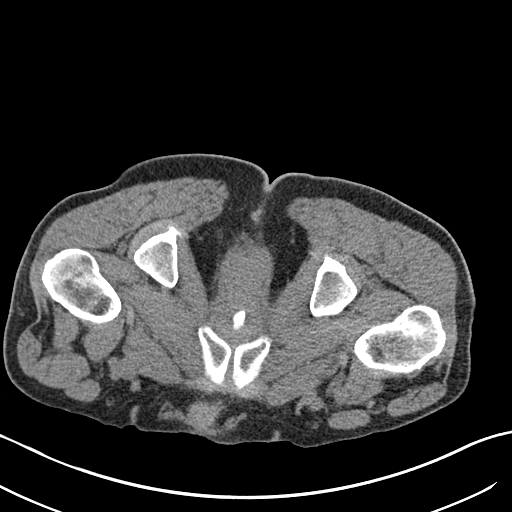
[im 11/47  soft-tissue]
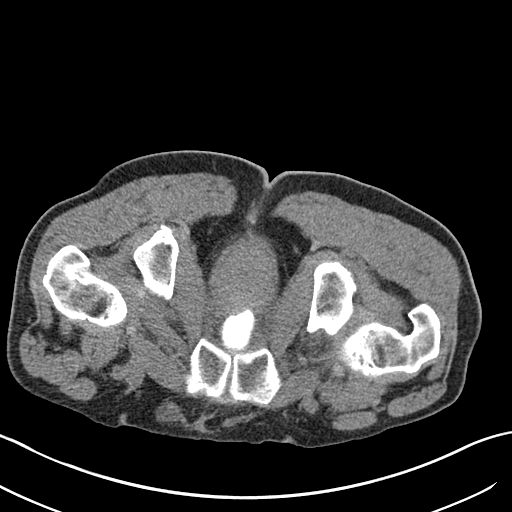
[im 15/47  soft-tissue]
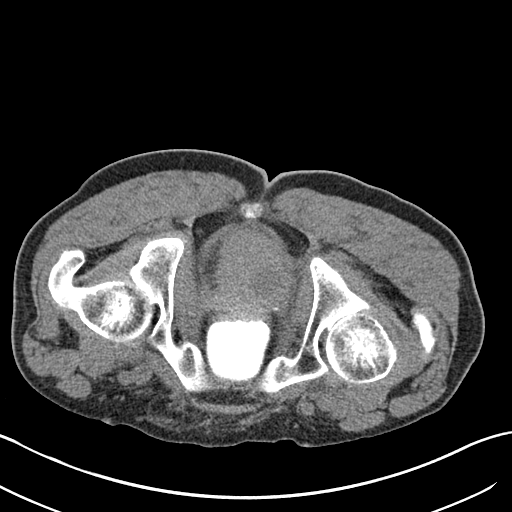
[im 18/47  soft-tissue]
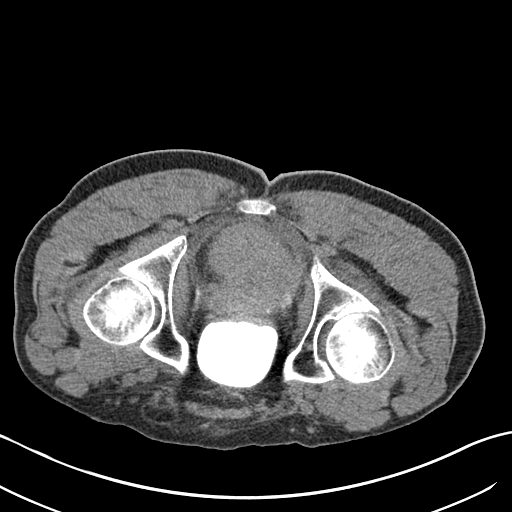
[im 22/47  soft-tissue]
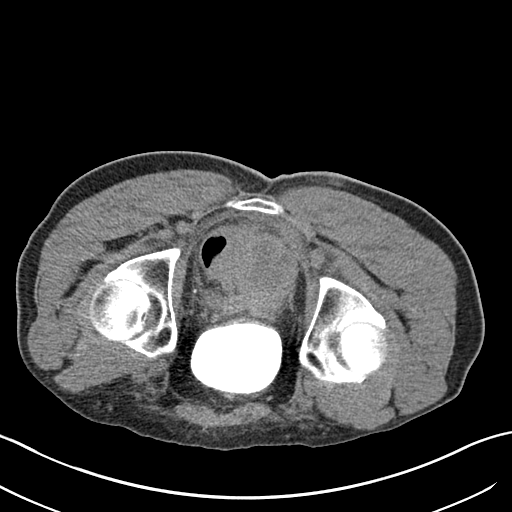
[im 25/47  soft-tissue]
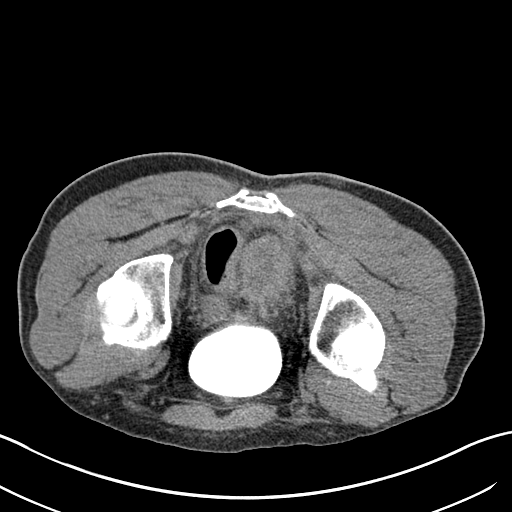
[im 29/47  soft-tissue]
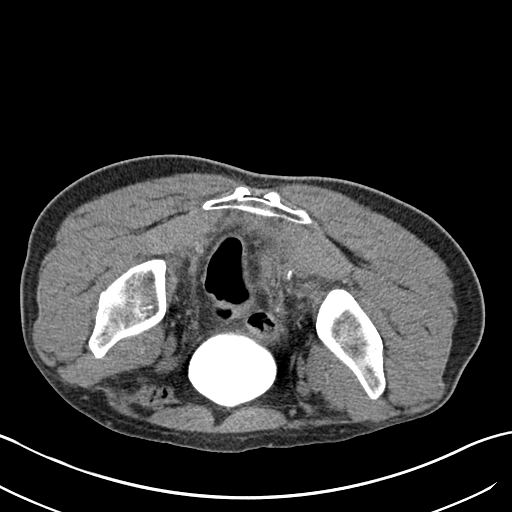
[im 32/47  soft-tissue]
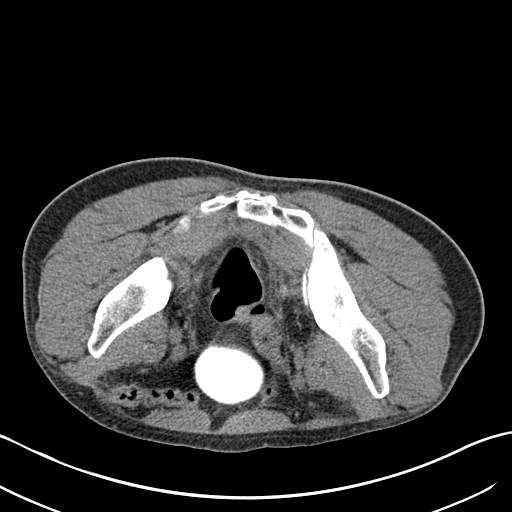
[im 32/47  lung]
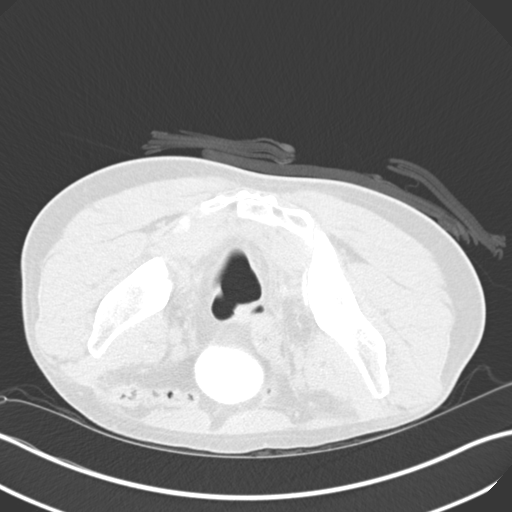
[im 32/47  bone]
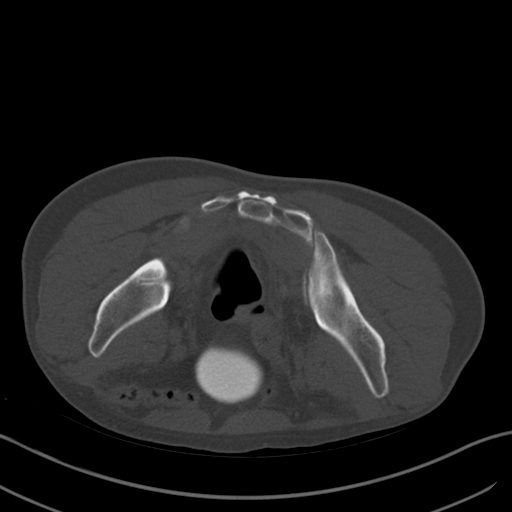
[im 36/47  soft-tissue]
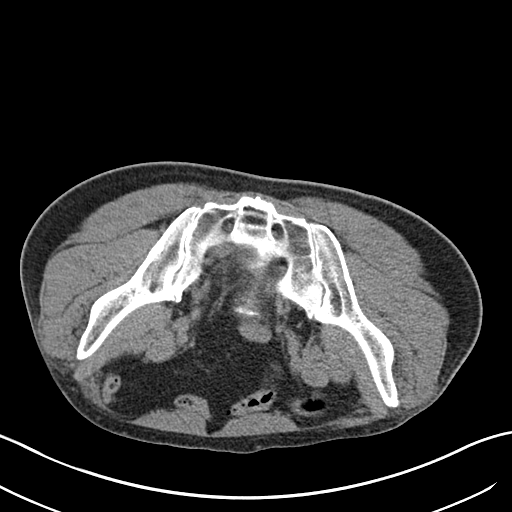
[im 36/47  lung]
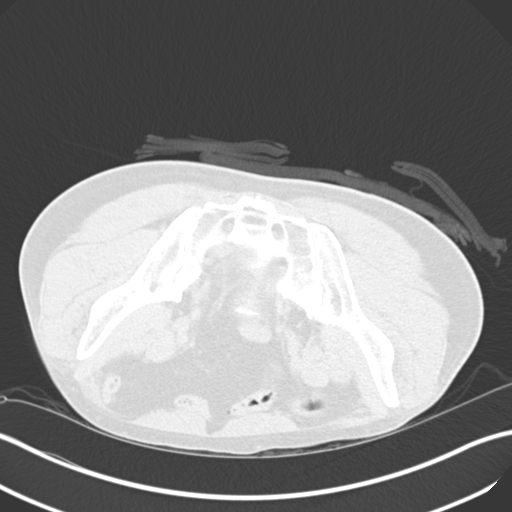
[im 39/47  soft-tissue]
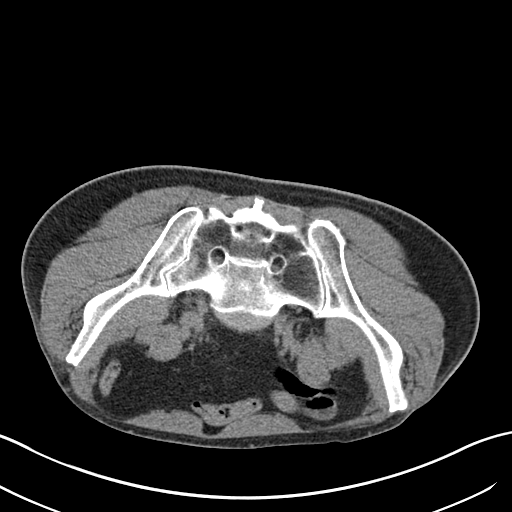
[im 39/47  lung]
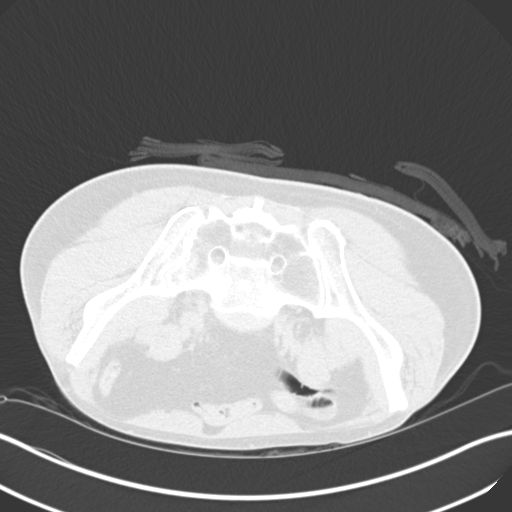
[im 43/47  soft-tissue]
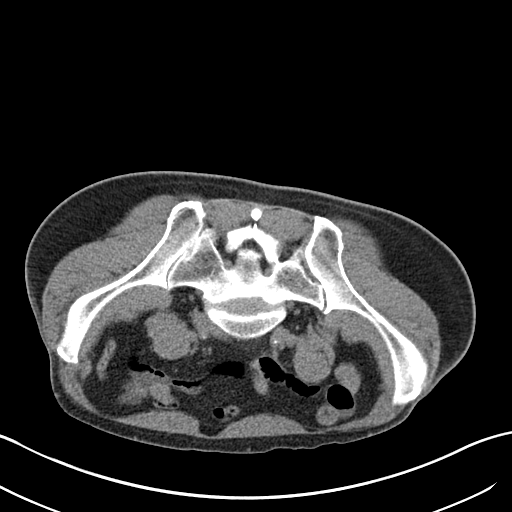
[im 43/47  lung]
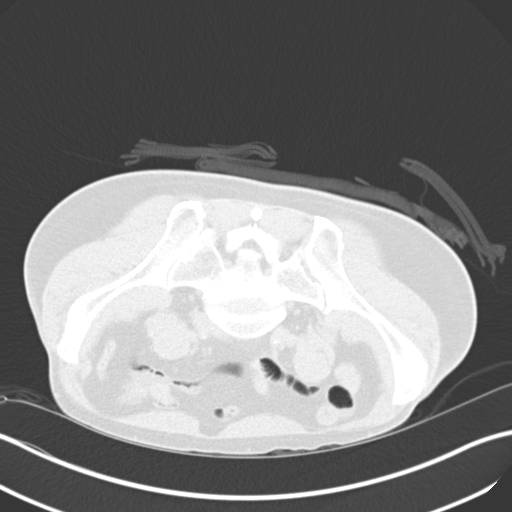

[12 of 32 positions shown; findings below may reference images not displayed]

EXAM:
CT GUIDED ASPIRATION OF PELVIC COLLECTION

RADIATION DOSE REDUCTION: This exam was performed according to the
departmental dose-optimization program which includes automated
exposure control, adjustment of the mA and/or kV according to
patient size and/or use of iterative reconstruction technique.
IR CT,
07/14/2021 and 06/23/2021. IR fluoroscopy, 08/01/2021 and
07/25/2021.

MEDICATIONS:
None

ANESTHESIA/SEDATION:
Moderate (conscious) sedation was employed during this procedure. A
total of Versed 2 mg and Fentanyl 100 mcg was administered
intravenously.

Moderate Sedation Time: 12 minutes. The patient's level of
consciousness and vital signs were monitored continuously by
radiology nursing throughout the procedure under my direct
supervision.

CONTRAST:  None

COMPLICATIONS:
None immediate.

PROCEDURE:
RADIATION DOSE REDUCTION: This exam was performed according to the
departmental dose-optimization program which includes automated
exposure control, adjustment of the mA and/or kV according to
patient size and/or use of iterative reconstruction technique.

Informed written consent was obtained from the patient after a
discussion of the risks, benefits and alternatives to treatment. The
patient was placed prone on the CT gantry and a pre procedural CT
was performed re-demonstrating the known abscess/fluid collection
within the deep pelvis. The procedure was planned. A timeout was
performed prior to the initiation of the procedure.

The LEFT gluteal area was prepped and draped in the usual sterile
fashion. The overlying soft tissues were anesthetized with 1%
lidocaine with epinephrine. Appropriate trajectory was planned with
the use of a 22 gauge spinal needle. An 18 gauge trocar needle was
advanced into the abscess/fluid collection. Appropriate positioning
was confirmed with a limited CT scan.

Aspiration was performed with only 1-2 mL of thick mucus consistency
of fluid aspirated. Patient was against drainage catheter placement
secondary to prior discomfort. Additional attempts at aspiration
with 20 mL syringes was performed.

The needle was then removed. A dressing was placed. The patient
tolerated the procedure well without immediate post procedural
complication.
IMPRESSION: Successful CT guided aspiration of recurrent pelvic fluid
collection, as above. The patient was against drainage catheter
placement secondary to prior discomfort.

1-2 mL of thick mucus-appearing fluid was aspirated and submitted to
cytology for analysis.

## 2022-09-25 IMAGING — CT CT PELVIS W/ CM
2 of 3 series · 16 of 46 positions shown, 18 images · IV contrast (APPLIED)
Comparison: Multiple priors

CLINICAL DATA: Recurrent pelvic fluid collection

EXAM:
CT PELVIS WITH CONTRAST
TECHNIQUE: Multidetector CT imaging of the pelvis was performed using the
standard protocol following the bolus administration of intravenous
contrast.

[Series 2: routine abd/pel with · axial · 0.74mm/px · z∈[-668,-378]mm · 13 of 68 slices shown, 15 images]
[im 5/68  soft-tissue]
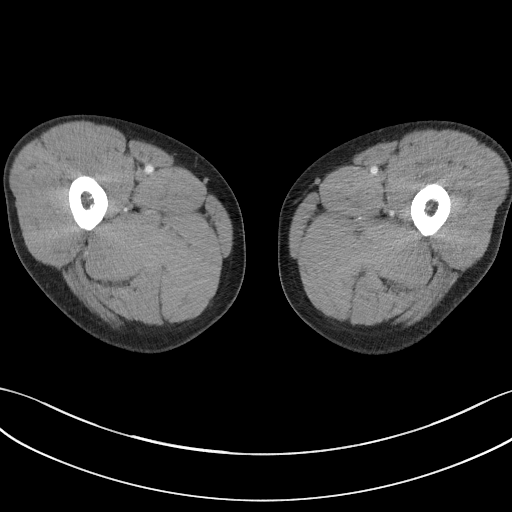
[im 5/68  bone]
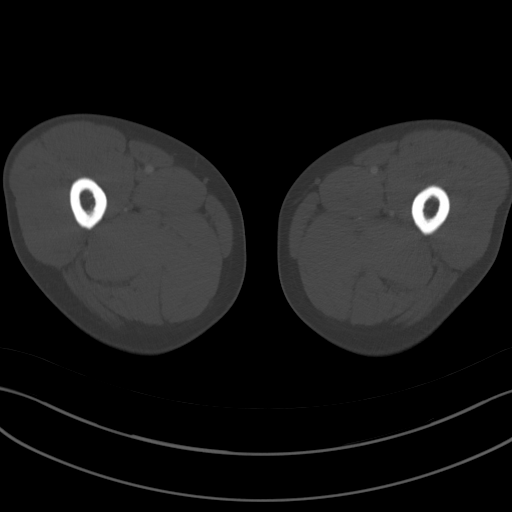
[im 9/68  soft-tissue]
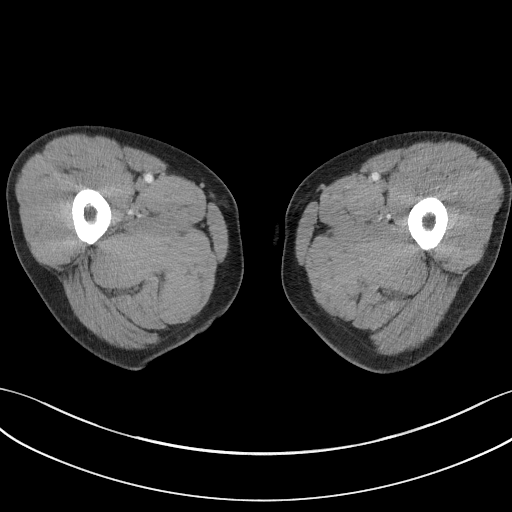
[im 13/68  soft-tissue]
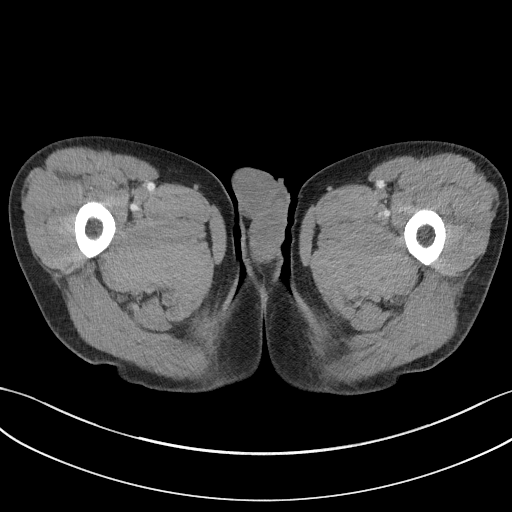
[im 20/68  soft-tissue]
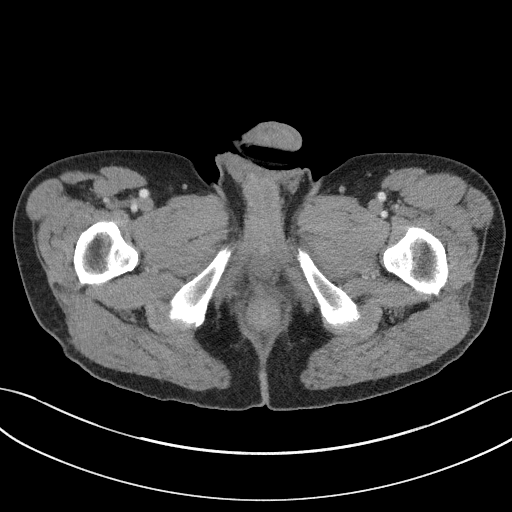
[im 24/68  soft-tissue]
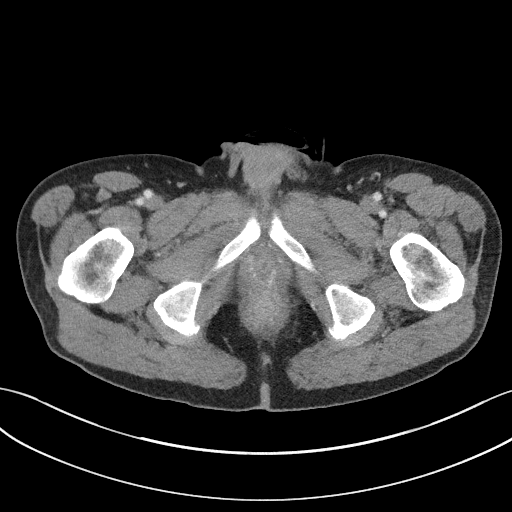
[im 29/68  soft-tissue]
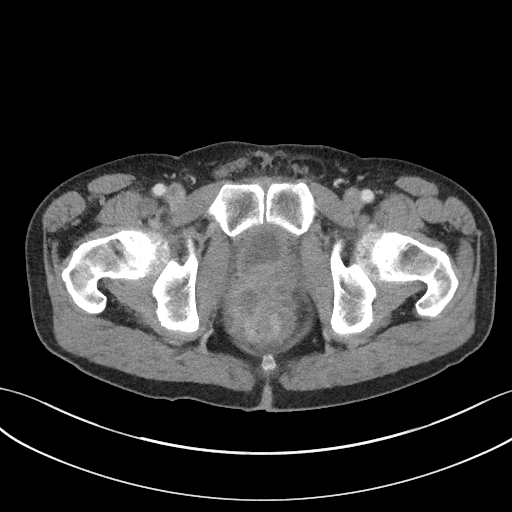
[im 35/68  soft-tissue]
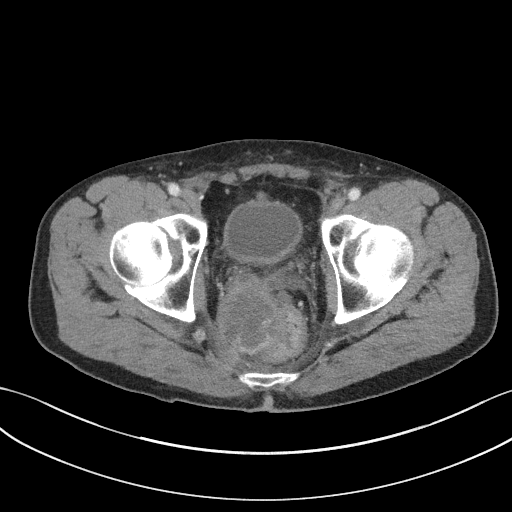
[im 39/68  soft-tissue]
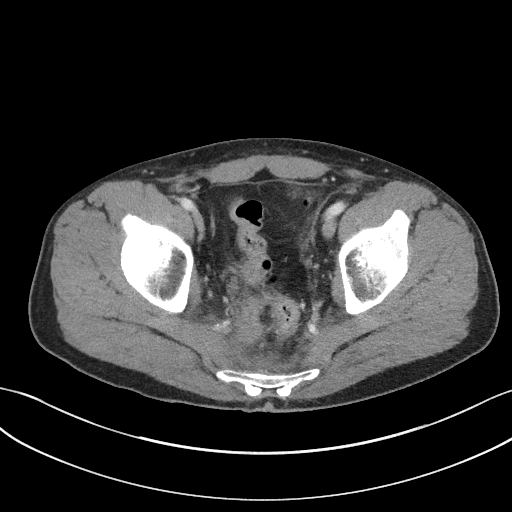
[im 44/68  soft-tissue]
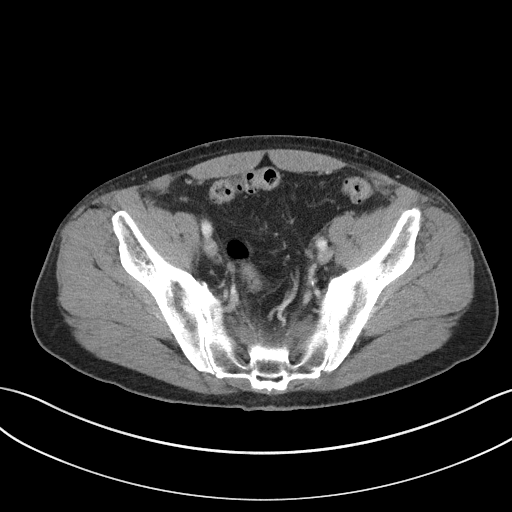
[im 44/68  bone]
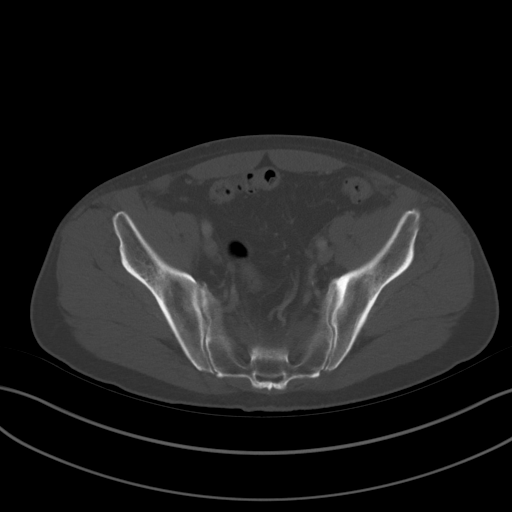
[im 48/68  soft-tissue]
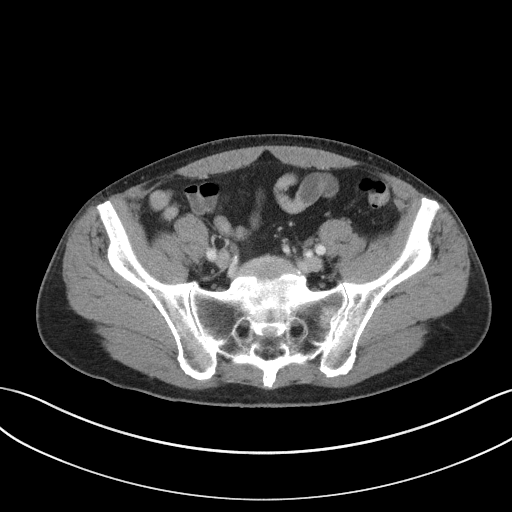
[im 55/68  soft-tissue]
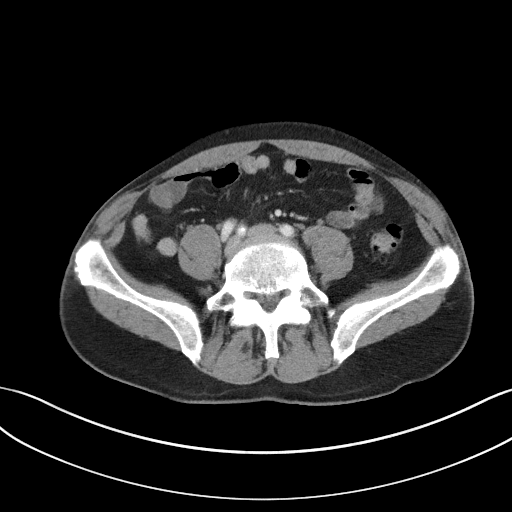
[im 59/68  soft-tissue]
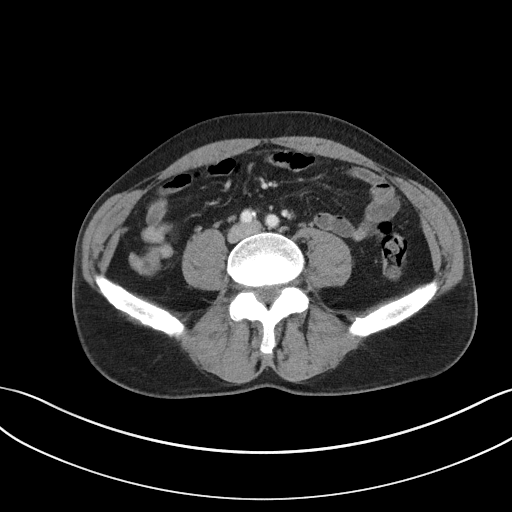
[im 63/68  soft-tissue]
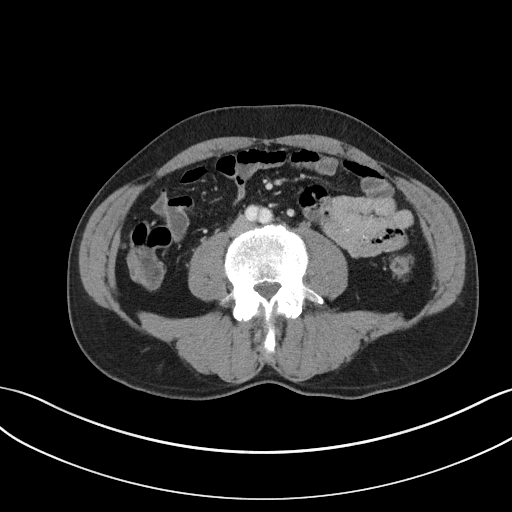

[Series 4: coronal st · coronal · 0.69mm/px · 3 of 79 slices shown]
[im 27/79  soft-tissue]
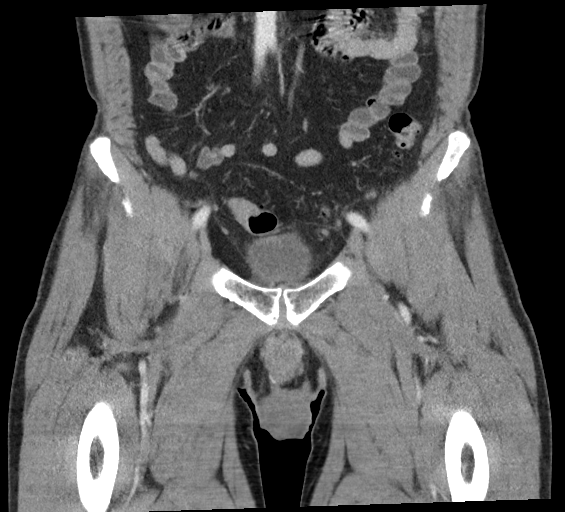
[im 35/79  soft-tissue]
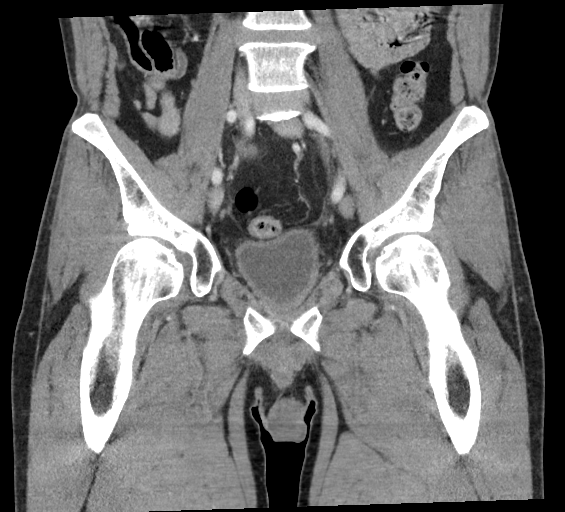
[im 44/79  soft-tissue]
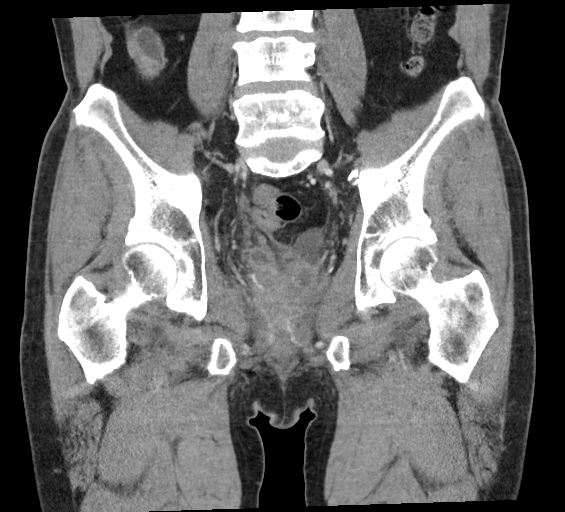

[16 of 46 positions shown; findings below may reference images not displayed]

RADIATION DOSE REDUCTION: This exam was performed according to the
departmental dose-optimization program which includes automated
exposure control, adjustment of the mA and/or kV according to
patient size and/or use of iterative reconstruction technique.

CONTRAST:  100mL OMNIPAQUE IOHEXOL 300 MG/ML  SOLN
FINDINGS: Urinary Tract: Distal ureters and urinary bladder are unremarkable.

Stomach/Bowel: The visualized small bowel and large bowel are normal
in caliber.

Reproductive: Status post prostatectomy.

Lymphatic: No enlarged lymph nodes in the abdomen or pelvis.

Other: Interval removal of percutaneous drainage catheter. Recurrent
lower pelvic/perirectal fluid collection measures 4.6 x 3.7 cm on
today's exam, previously measuring up to 8.8 x 6.8 cm on pre
drainage CT dated July 10, 2021. It demonstrates slightly thickened
and irregular walls.

Musculoskeletal: No aggressive osseous lesions.
IMPRESSION: Interval removal of percutaneous drainage catheter with recurrent
lower pelvic/perirectal fluid collection measuring up to 4.6 x
cm on today's exam. It is overall smaller in size when compared to
initial pre drainage CT dated July 10, 2021 when it measured up to
cm in size. It demonstrates slightly thickened and irregular walls,
which may represent postdrainage inflammatory changes, although
differential considerations include secondary infection. Patient is
scheduled for possible aspiration versus drainage later same day.

## 2022-09-25 MED ORDER — ZOLPIDEM TARTRATE ER 12.5 MG PO TBCR
12.5000 mg | EXTENDED_RELEASE_TABLET | Freq: Every evening | ORAL | 0 refills | Status: DC | PRN
Start: 2022-09-25 — End: 2022-11-18

## 2022-09-25 NOTE — Telephone Encounter (Signed)
Patient said pharmacy had sent a refill request to our office on Monday 09/21/22 asking for refills on Ambien.   Please send this today as he has been out for 3 days.  [facility] ([address] of [address].

## 2022-09-26 ENCOUNTER — Other Ambulatory Visit: Payer: Self-pay | Admitting: Family Medicine

## 2022-09-26 DIAGNOSIS — G629 Polyneuropathy, unspecified: Secondary | ICD-10-CM

## 2022-10-06 DIAGNOSIS — N393 Stress incontinence (female) (male): Secondary | ICD-10-CM | POA: Diagnosis not present

## 2022-10-19 ENCOUNTER — Encounter: Payer: Self-pay | Admitting: Urology

## 2022-10-19 DIAGNOSIS — E785 Hyperlipidemia, unspecified: Secondary | ICD-10-CM | POA: Diagnosis not present

## 2022-10-19 DIAGNOSIS — Z9079 Acquired absence of other genital organ(s): Secondary | ICD-10-CM | POA: Diagnosis not present

## 2022-10-19 DIAGNOSIS — N393 Stress incontinence (female) (male): Secondary | ICD-10-CM | POA: Diagnosis not present

## 2022-10-19 DIAGNOSIS — Z8546 Personal history of malignant neoplasm of prostate: Secondary | ICD-10-CM | POA: Diagnosis not present

## 2022-10-19 DIAGNOSIS — Z7982 Long term (current) use of aspirin: Secondary | ICD-10-CM | POA: Diagnosis not present

## 2022-10-21 ENCOUNTER — Encounter: Payer: Self-pay | Admitting: Otolaryngology

## 2022-10-21 NOTE — Anesthesia Preprocedure Evaluation (Addendum)
Anesthesia Evaluation  Patient identified by MRN, date of birth, ID band Patient awake    Reviewed: Allergy & Precautions, H&P , NPO status , Patient's Chart, lab work & pertinent test results  History of Anesthesia Complications (+) PONV and history of anesthetic complications  Airway Mallampati: III  TM Distance: <3 FB Neck ROM: Full   Comment: Notable overbite; lower jaw receding, would expect likely anterior airway, suggest videolaryngoscope for intubation Dental no notable dental hx. (+) Caps   Pulmonary neg pulmonary ROS   Pulmonary exam normal breath sounds clear to auscultation       Cardiovascular negative cardio ROS Normal cardiovascular exam Rhythm:Regular Rate:Normal     Neuro/Psych  Neuromuscular disease negative neurological ROS  negative psych ROS   GI/Hepatic Neg liver ROS,GERD  ,,  Endo/Other  negative endocrine ROS    Renal/GU negative Renal ROS  negative genitourinary   Musculoskeletal  (+) Arthritis ,    Abdominal   Peds negative pediatric ROS (+)  Hematology  (+) Blood dyscrasia, anemia   Anesthesia Other Findings Hyperlipidemia  Reflux Hemorrhoid  Joint pain GERD (gastroesophageal reflux disease) Complication of anesthesia--slow to awaken and PONV PONV (postoperative nausea and vomiting)  Prostate cancer  Seasonal allergies  Actinic keratosis    Reproductive/Obstetrics negative OB ROS                             Anesthesia Physical Anesthesia Plan  ASA: 3  Anesthesia Plan: General ETT   Post-op Pain Management:    Induction: Intravenous  PONV Risk Score and Plan:   Airway Management Planned: Oral ETT  Additional Equipment:   Intra-op Plan:   Post-operative Plan: Extubation in OR  Informed Consent: I have reviewed the patients History and Physical, chart, labs and discussed the procedure including the risks, benefits and alternatives for the  proposed anesthesia with the patient or authorized representative who has indicated his/her understanding and acceptance.     Dental Advisory Given  Plan Discussed with: Anesthesiologist, CRNA and Surgeon  Anesthesia Plan Comments: (Patient consented for risks of anesthesia including but not limited to:  - adverse reactions to medications - damage to eyes, teeth, lips or other oral mucosa - nerve damage due to positioning  - sore throat or hoarseness - Damage to heart, brain, nerves, lungs, other parts of body or loss of life  Patient voiced understanding.)       Anesthesia Quick Evaluation

## 2022-10-23 DIAGNOSIS — R32 Unspecified urinary incontinence: Secondary | ICD-10-CM | POA: Diagnosis not present

## 2022-10-23 DIAGNOSIS — C61 Malignant neoplasm of prostate: Secondary | ICD-10-CM | POA: Diagnosis not present

## 2022-10-23 DIAGNOSIS — Z9889 Other specified postprocedural states: Secondary | ICD-10-CM | POA: Diagnosis not present

## 2022-10-23 DIAGNOSIS — Z7982 Long term (current) use of aspirin: Secondary | ICD-10-CM | POA: Diagnosis not present

## 2022-10-26 DIAGNOSIS — C61 Malignant neoplasm of prostate: Secondary | ICD-10-CM | POA: Diagnosis not present

## 2022-10-26 NOTE — Discharge Instructions (Signed)
[facility] ENDOSCOPIC SINUS SURGERY [facility]  What is Functional Endoscopic Sinus Surgery?  The Surgery involves making the natural openings of the sinuses larger by removing the bony partitions that separate the sinuses from the nasal cavity.  The natural sinus lining is preserved as much as possible to allow the sinuses to resume normal function after the surgery.  In some patients nasal polyps (excessively swollen lining of the sinuses) may be removed to relieve obstruction of the sinus openings.  The surgery is performed through the nose using lighted scopes, which eliminates the need for incisions on the face.  A septoplasty is a different procedure which is sometimes performed with sinus surgery.  It involves straightening the boy partition that separates the two sides of your nose.  A crooked or deviated septum may need repair if is obstructing the sinuses or nasal airflow.  Turbinate reduction is also often performed during sinus surgery.  The turbinates are bony proturberances from the side walls of the nose which swell and can obstruct the nose in patients with sinus and allergy problems.  Their size can be surgically reduced to help relieve nasal obstruction.  What Can Sinus Surgery Do For Me?  Sinus surgery can reduce the frequency of sinus infections requiring antibiotic treatment.  This can provide improvement in nasal congestion, post-nasal drainage, facial pressure and nasal obstruction.  Surgery will NOT prevent you from ever having an infection again, so it usually only for patients who get infections 4 or more times yearly requiring antibiotics, or for infections that do not clear with antibiotics.  It will not cure nasal allergies, so patients with allergies may still require medication to treat their allergies after surgery. Surgery may improve headaches related to sinusitis, however, some people will continue to  require medication to control sinus headaches related to allergies.  Surgery will do nothing for other forms of headache (migraine, tension or cluster).  What Are the Risks of Endoscopic Sinus Surgery?  Current techniques allow surgery to be performed safely with little risk, however, there are rare complications that patients should be aware of.  Because the sinuses are located around the eyes, there is risk of eye injury, including blindness, though again, this would be quite rare. This is usually a result of bleeding behind the eye during surgery, which can effect vision, though there are treatments to protect the vision and prevent permanent injury. More serious complications would include bleeding inside the brain cavity or damage to the brain.This happens when the fluid around the brain leaks out into the sinus cavity.  Again, all of these complications are uncommon, and spinal fluid leaks can be safely managed surgically if they occur.  The most common complication of sinus surgery is bleeding from the nose, which may require packing or cauterization of the nose.  Patients with polyps may experience recurrence of the polyps that would require revision surgery.  Alterations of sense of smell or injury to the tear ducts are also rare complications.   What is the Surgery Like, and what is the Recovery?  The Surgery usually takes a couple of hours to perform, and is usually performed under a general anesthetic (completely asleep).  Patients are usually discharged home after a couple of hours.  Sometimes during surgery it is necessary to pack the nose to control bleeding, and the packing is left in place for 24 - 48 hours, and removed by your surgeon.  If   a septoplasty was performed during the procedure, there is often a splint placed which must be removed after 5-7 days.   Discomfort: Pain is usually mild to moderate, and can be controlled by prescription pain medication or acetaminophen (Tylenol).   Aspirin, Ibuprofen (Advil, Motrin), or Naprosyn (Aleve) should be avoided, as they can cause increased bleeding.  Most patients feel sinus pressure like they have a bad head cold for several days.  Sleeping with your head elevated can help reduce swelling and facial pressure, as can ice packs over the face.  A humidifier may be helpful to keep the mucous and blood from drying in the nose.   Diet: There are no specific diet restrictions, however, you should generally start with clear liquids and a light diet of bland foods because the anesthetic can cause some nausea.  Advance your diet depending on how your stomach feels.  Taking your pain medication with food will often help reduce stomach upset which pain medications can cause.  Nasal Saline Irrigation: It is important to remove blood clots and dried mucous from the nose as it is healing.  This is done by having you irrigate the nose at least 3 - 4 times daily with a salt water solution.  We recommend using NeilMed Sinus Rinse (available at the drug store).  Fill the squeeze bottle with the solution, bend over a sink, and insert the tip of the squeeze bottle into the nose  of an inch.  Point the tip of the squeeze bottle towards the inside corner of the eye on the same side your irrigating.  Squeeze the bottle and gently irrigate the nose.  If you bend forward as you do this, most of the fluid will flow back out of the nose, instead of down your throat.   The solution should be warm, near body temperature, when you irrigate.   Each time you irrigate, you should use a full squeeze bottle.   Note that if you are instructed to use Nasal Steroid Sprays at any time after your surgery, irrigate with saline BEFORE using the steroid spray, so you do not wash it all out of the nose. Another product, Nasal Saline Gel (such as AYR Nasal Saline Gel) can be applied in each nostril 3 - 4 times daily to moisture the nose and reduce scabbing or crusting.  Bleeding:   Bloody drainage from the nose can be expected for several days, and patients are instructed to irrigate their nose frequently with salt water to help remove mucous and blood clots.  The drainage may be dark red or brown, though some fresh blood may be seen intermittently, especially after irrigation.  Do not blow you nose, as bleeding may occur. If you must sneeze, keep your mouth open to allow air to escape through your mouth.  If heavy bleeding occurs: Irrigate the nose with saline to rinse out clots, then spray the nose 3 - 4 times with Afrin Nasal Decongestant Spray.  The spray will constrict the blood vessels to slow bleeding.  Pinch the lower half of your nose shut to apply pressure, and lay down with your head elevated.  Ice packs over the nose may help as well. If bleeding persists despite these measures, you should notify your doctor.  Do not use the Afrin routinely to control nasal congestion after surgery, as it can result in worsening congestion and may affect healing.     Activity: Return to work varies among patients. Most patients will be out   of work at least 5 - 7 days to recover.  Patient may return to work after they are off of narcotic pain medication, and feeling well enough to perform the functions of their job.  Patients must avoid heavy lifting (over 10 pounds) or strenuous physical for 2 weeks after surgery, so your employer may need to assign you to light duty, or keep you out of work longer if light duty is not possible.  NOTE: you should not drive, operate dangerous machinery, do any mentally demanding tasks or make any important legal or financial decisions while on narcotic pain medication and recovering from the general anesthetic.    Call Your Doctor Immediately if You Have Any of the Following: Bleeding that you cannot control with the above measures Loss of vision, double vision, bulging of the eye or black eyes. Fever over 101 degrees Neck stiffness with severe headache,  fever, nausea and change in mental state. You are always encouraged to call anytime with concerns, however, please call with requests for pain medication refills during office hours.  Office Endoscopy: During follow-up visits your doctor will remove any packing or splints that may have been placed and evaluate and clean your sinuses endoscopically.  Topical anesthetic will be used to make this as comfortable as possible, though you may want to take your pain medication prior to the visit.  How often this will need to be done varies from patient to patient.  After complete recovery from the surgery, you may need follow-up endoscopy from time to time, particularly if there is concern of recurrent infection or nasal polyps.  

## 2022-10-27 ENCOUNTER — Telehealth: Payer: Self-pay | Admitting: Family Medicine

## 2022-10-27 MED ORDER — OMEPRAZOLE 40 MG PO CPDR: 40.0000 mg | DELAYED_RELEASE_CAPSULE | Freq: Every day | ORAL | 3 refills | Status: DC

## 2022-10-27 NOTE — Telephone Encounter (Signed)
Patient needs refills on Omeprazole 40 mg. please to [facility] on [address].

## 2022-10-28 ENCOUNTER — Ambulatory Visit: Payer: BC Managed Care – PPO | Admitting: Anesthesiology

## 2022-10-28 ENCOUNTER — Other Ambulatory Visit: Payer: Self-pay

## 2022-10-28 ENCOUNTER — Ambulatory Visit
Admission: RE | Admit: 2022-10-28 | Discharge: 2022-10-28 | Disposition: A | Discharge status: Home / Self Care | Payer: BC Managed Care – PPO | Attending: Otolaryngology | Admitting: Otolaryngology

## 2022-10-28 ENCOUNTER — Encounter
Admission: RE | Disposition: A | Discharge status: Home / Self Care | Payer: Self-pay | Source: Home / Self Care | Attending: Otolaryngology

## 2022-10-28 ENCOUNTER — Encounter: Payer: Self-pay | Admitting: Otolaryngology

## 2022-10-28 DIAGNOSIS — J324 Chronic pansinusitis: Secondary | ICD-10-CM | POA: Diagnosis not present

## 2022-10-28 DIAGNOSIS — E785 Hyperlipidemia, unspecified: Secondary | ICD-10-CM | POA: Insufficient documentation

## 2022-10-28 DIAGNOSIS — M2629 Other anomalies of dental arch relationship: Secondary | ICD-10-CM | POA: Insufficient documentation

## 2022-10-28 DIAGNOSIS — J329 Chronic sinusitis, unspecified: Secondary | ICD-10-CM | POA: Diagnosis not present

## 2022-10-28 DIAGNOSIS — K219 Gastro-esophageal reflux disease without esophagitis: Secondary | ICD-10-CM | POA: Insufficient documentation

## 2022-10-28 HISTORY — DX: Anemia, unspecified: D64.9

## 2022-10-28 HISTORY — PX: SPHENOIDECTOMY: SHX2421

## 2022-10-28 HISTORY — DX: Other anomalies of dental arch relationship: M26.29

## 2022-10-28 HISTORY — PX: ETHMOIDECTOMY: SHX5197

## 2022-10-28 HISTORY — PX: FRONTAL SINUS EXPLORATION: SHX6591

## 2022-10-28 HISTORY — PX: MAXILLARY ANTROSTOMY: SHX2003

## 2022-10-28 HISTORY — PX: IMAGE GUIDED SINUS SURGERY: SHX6570

## 2022-10-28 SURGERY — SINUS SURGERY, WITH IMAGING GUIDANCE
Anesthesia: General | Laterality: Right

## 2022-10-28 MED ORDER — SUCCINYLCHOLINE CHLORIDE 200 MG/10ML IV SOSY
PREFILLED_SYRINGE | INTRAVENOUS | Status: DC | PRN
  Administered 2022-10-28: 100 mg via INTRAVENOUS

## 2022-10-28 MED ORDER — EPHEDRINE SULFATE (PRESSORS) 50 MG/ML IJ SOLN
INTRAMUSCULAR | Status: DC | PRN
  Administered 2022-10-28 (×2): 5 mg via INTRAVENOUS

## 2022-10-28 MED ORDER — OXYMETAZOLINE HCL 0.05 % NA SOLN
NASAL | Status: DC | PRN
  Administered 2022-10-28: 1 via TOPICAL

## 2022-10-28 MED ORDER — LIDOCAINE HCL (CARDIAC) PF 100 MG/5ML IV SOSY
PREFILLED_SYRINGE | INTRAVENOUS | Status: DC | PRN
  Administered 2022-10-28: 100 mg via INTRAVENOUS

## 2022-10-28 MED ORDER — PROPOFOL 10 MG/ML IV BOLUS
INTRAVENOUS | Status: DC | PRN
  Administered 2022-10-28: 200 mg via INTRAVENOUS

## 2022-10-28 MED ORDER — ONDANSETRON HCL 4 MG/2ML IJ SOLN
INTRAMUSCULAR | Status: DC | PRN
  Administered 2022-10-28: 4 mg via INTRAVENOUS

## 2022-10-28 MED ORDER — MIDAZOLAM HCL 5 MG/5ML IJ SOLN
INTRAMUSCULAR | Status: DC | PRN
  Administered 2022-10-28: 2 mg via INTRAVENOUS

## 2022-10-28 MED ORDER — HYDROCODONE-ACETAMINOPHEN 5-325 MG PO TABS: 1.0000 | ORAL_TABLET | ORAL | 0 refills | Status: DC | PRN

## 2022-10-28 MED ORDER — LIDOCAINE-EPINEPHRINE 1 %-1:100000 IJ SOLN
INTRAMUSCULAR | Status: DC | PRN
  Administered 2022-10-28: 7 mL

## 2022-10-28 MED ORDER — ONDANSETRON HCL 4 MG PO TABS: 4.0000 mg | ORAL_TABLET | Freq: Three times a day (TID) | ORAL | 0 refills | Status: DC | PRN

## 2022-10-28 MED ORDER — LACTATED RINGERS IV SOLN: INTRAVENOUS | Status: DC

## 2022-10-28 MED ORDER — DEXAMETHASONE SODIUM PHOSPHATE 4 MG/ML IJ SOLN
INTRAMUSCULAR | Status: DC | PRN
  Administered 2022-10-28: 8 mg via INTRAVENOUS

## 2022-10-28 MED ORDER — FAMOTIDINE IN NACL 20-0.9 MG/50ML-% IV SOLN
20.0000 mg | Freq: Once | INTRAVENOUS | Status: AC
  Administered 2022-10-28: 20 mg via INTRAVENOUS

## 2022-10-28 MED ORDER — FENTANYL CITRATE (PF) 100 MCG/2ML IJ SOLN
INTRAMUSCULAR | Status: DC | PRN
  Administered 2022-10-28: 100 ug via INTRAVENOUS

## 2022-10-28 MED ORDER — AMISULPRIDE (ANTIEMETIC) 5 MG/2ML IV SOLN
5.0000 mg | Freq: Once | INTRAVENOUS | Status: AC
  Administered 2022-10-28: 5 mg via INTRAVENOUS

## 2022-10-28 MED ORDER — AMOXICILLIN-POT CLAVULANATE 875-125 MG PO TABS: 1.0000 | ORAL_TABLET | Freq: Two times a day (BID) | ORAL | 0 refills | Status: AC

## 2022-10-28 MED ORDER — ACETAMINOPHEN 10 MG/ML IV SOLN: 1000.0000 mg | Freq: Once | INTRAVENOUS | Status: DC

## 2022-10-28 SURGICAL SUPPLY — 29 items
ANTIFOG SOL W/FOAM PAD STRL (MISCELLANEOUS) ×3
BLADE SHAVER TRUDI 4 15 DEG (ENT DISPOSABLE) IMPLANT
BLADE SHAVER TRUDI STR 4 (ENT DISPOSABLE) IMPLANT
CABLE TRUDI DISPOSABLE (ENT DISPOSABLE) ×6 IMPLANT
CANISTER SUCT 1200ML W/VALVE (MISCELLANEOUS) ×3 IMPLANT
DEVICE INFLATION SEID (MISCELLANEOUS) IMPLANT
DRESSING NASL FOAM PST OP SINU (MISCELLANEOUS) IMPLANT
DRSG NASAL FOAM POST OP SINU (MISCELLANEOUS) ×3
ELECT REM PT RETURN 9FT ADLT (ELECTROSURGICAL) ×3
ELECTRODE REM PT RTRN 9FT ADLT (ELECTROSURGICAL) ×3 IMPLANT
GLOVE SURG GAMMEX PI TX LF 7.5 (GLOVE) ×6 IMPLANT
GOWN STRL REUS W/ TWL LRG LVL3 (GOWN DISPOSABLE) ×3 IMPLANT
GOWN STRL REUS W/TWL LRG LVL3 (GOWN DISPOSABLE) ×3
IV NS 500ML (IV SOLUTION) ×3
IV NS 500ML BAXH (IV SOLUTION) ×3 IMPLANT
KIT TURNOVER KIT A (KITS) ×3 IMPLANT
NS IRRIG 500ML POUR BTL (IV SOLUTION) ×3 IMPLANT
PACK ENT CUSTOM (PACKS) ×3 IMPLANT
PACKING NASAL EPIS 4X2.4 XEROG (MISCELLANEOUS) IMPLANT
PATTIES SURGICAL .5 X3 (DISPOSABLE) ×3 IMPLANT
SOLUTION ANTFG W/FOAM PAD STRL (MISCELLANEOUS) ×3 IMPLANT
STRAP BODY AND KNEE 60X3 (MISCELLANEOUS) ×3 IMPLANT
SUCTION COAG ELEC 10 HAND CTRL (ELECTROSURGICAL) ×3 IMPLANT
SYR 10ML LL (SYRINGE) ×3 IMPLANT
SYR EAR/ULCER 2OZ (SYRINGE) ×3 IMPLANT
SYSTEM BALLOON SINUPLASTY 6X16 (SINUPLASTY) IMPLANT
TRACKER DISPOSABLE PAITIENT (MISCELLANEOUS) ×3 IMPLANT
TUBING IRRIGATION BIEN-AIR (TUBING) ×3 IMPLANT
WATER STERILE IRR 250ML POUR (IV SOLUTION) IMPLANT

## 2022-10-28 NOTE — Op Note (Signed)
..10/28/2022  9:53 AM    Dale Gray  [id number]   Pre-Op Dx:  Chronic Rhinosinusitis refractory to medical treatment  Post-op Dx: Same  Proc:   1)  Bilateral Maxillary Antrostomy  2)  Bilateral total ethmoidectomy  3)  Left Sphenoidotomy  4)  Right Frontal sinusotomy  5)  Image Guided sinus surgery  Surg:  Roney Mans Dajia Gunnels  Anes:  General  EBL:  20ml  Comp:  None  Findings: Bilateral large accessory ostia that were opened and connected to natural ostia.  Bilateral thick mucoid drainage in ethmoid sinus and left sphenoid sinus and right frontal sinus.  Large ager nasi air cell L greater R.  Procedure: After the patient was identified in holding and the benefits of the procedure were reviewed as well as the consent and risks.  The patient was taken to the operating room and with the patient in a comfortable supine position,  general orotracheal anesthesia was induced without difficulty.  A proper time-out was performed.  The Trudi image guidance system was set up and calibrated in the normal fashion.       The patient next received preoperative Afrin spray for topical decongestion and vasoconstriction and 1% Xylocaine with 1:100,000 epinephrine, 7 cc's, was infiltrated into the inferior turbinates, septum, and anterior middle turbinates bilaterally.  Several minutes were allowed for this to take effect.  Cottoniod pledgets soaked in Afrin were placed into both nasal cavities and left while the patient was prepped and draped in the standard fashion.  The materials were removed from the nose and observed to be intact and correct in number.  The nose was next inspected with a zero degree endoscope and the middle turbinates were medialized and afrin soaked pledgets were placed lateral to the turbinates for approximately one minute.  At this time, attention was directed to the patient's right front sinus.  The uncinate process was infractured with a maxillary ostia seeker.  The  Acclarent balloon sinuplasty device was next placed behind the uncinate process and the guide wire was threaded into the frontal sinus with proper transillumination.  The sinus tract was then dilated 3X for a patent frontal sinus.  At this time a currette and Giraffe probe was used to remove the fractured ager nasi cells for a widely patient frontal sinus outflow tract. This was evaluated with 0 degree endoscope and fragements of residual ager nasi cells were removed with 45 degree up-biting forceps.  At this time attention was directed to the patient's maxillary sinuses.  On the left, a ball tipped probe was placed through the natural ostia and this was used to create a larger opening.  Using a microdebrider, the maxillary antrostomy was enlarged for a widely patent maxillary antrostomy.  Care was taken to enlarge this ostia to include the patient's accessory ostia.  This was repeated in a simlar fashion on the patient's right side.  Hemostasis was performed with topical Afrin soaked pledgets.  Visualization with a zero degree endoscope was used to examine the bilateral maxillary antrostomies which were noted to be widely patent and in continuity with the natural os bilaterally.  Next attention was directed to the patient's ethmoid sinuses.  The left ethmoid bulla was entered with a straight image guidance suction.  The ethmoid cells were opened from a medial and inferior position superior and laterally until the vertical lamella was encountered.  This was opened and the posterior ethmoid was opened in a similar fashion.  All fragments of bone and mucosa  were removed with straight biting forceps.  The skull base was identified and trauma was avoided.  Image guidance was used throughout to ensure all diseased air cells were opened.  This was repeated on the patient's right side in a similar fashion with all ethmoid air cells opened bilaterally.  Attention at this time was directed to the patient's sphenoid  sinuses.  On the patient's left side, the middle turbinate was lateralized and the superior turbinate was identified.  The sphenoid os was visualized and the op was sequentially dilated with an acclarent balloon device and then elarged with 45 degree biting forceps.  At this time with all planned sinuses opened, the patient's nasal cavity was examinated and copiously irrigated with sterile saline.  Meticulous hemostasis was continued and all sinuses were examined and noted to be widely patent.    Xerogel was placed lateral to the middle turbinates bilaterally and inflated with sterile saline.  Stamberger sinufoam was placed along the anterior portion and along the maxillary antrostomies bilaterally.      Care of the patient at this time was transferred to anesthesia and was extubated and taken to PACU in good condition.   Dispo:   PACU to home  Plan: Ice, elevation, narcotic analgesia and prophylactic antibiotics f  We will reevaluate the patient in the office in 7 days.  Return to work in 7-10 days, strenuous activities in two weeks.   Bud Face 10/28/2022 9:53 AM

## 2022-10-28 NOTE — Anesthesia Postprocedure Evaluation (Signed)
Anesthesia Post Note  Patient: Dale Gray  Procedure(s) Performed: IMAGE GUIDED SINUS SURGERY (Bilateral) MAXILLARY ANTROSTOMY (Bilateral) ETHMOIDECTOMY (Bilateral) FRONTAL SINUS EXPLORATION (Right) SPHENOIDECTOMY (Left)  Patient location during evaluation: PACU Anesthesia Type: General Level of consciousness: awake and alert Pain management: pain level controlled Vital Signs Assessment: post-procedure vital signs reviewed and stable Respiratory status: spontaneous breathing, nonlabored ventilation, respiratory function stable and patient connected to nasal cannula oxygen Cardiovascular status: blood pressure returned to baseline and stable Postop Assessment: no apparent nausea or vomiting Anesthetic complications: no   No notable events documented.   Last Vitals:  Vitals:   10/28/22 1025 10/28/22 1030  BP:  (!) 144/76  Pulse: 61 (!) 59  Resp: 19 12  Temp:  (!) 36.2 C  SpO2: 98% 100%    Last Pain:  Vitals:   10/28/22 1030  TempSrc:   PainSc: 0-No pain                 Laakea Pereira C Markita Stcharles

## 2022-10-28 NOTE — H&P (Signed)
..  History and Physical paper copy reviewed and updated date of procedure and will be scanned into system.  Patient seen and examined.  

## 2022-10-28 NOTE — Transfer of Care (Signed)
Immediate Anesthesia Transfer of Care Note  Patient: Dale Gray  Procedure(s) Performed: IMAGE GUIDED SINUS SURGERY (Bilateral) MAXILLARY ANTROSTOMY (Bilateral) ETHMOIDECTOMY (Bilateral) FRONTAL SINUS EXPLORATION (Right) SPHENOIDECTOMY (Left)  Patient Location: PACU  Anesthesia Type: General ETT  Level of Consciousness: awake, alert  and patient cooperative  Airway and Oxygen Therapy: Patient Spontanous Breathing and Patient connected to supplemental oxygen  Post-op Assessment: Post-op Vital signs reviewed, Patient's Cardiovascular Status Stable, Respiratory Function Stable, Patent Airway and No signs of Nausea or vomiting  Post-op Vital Signs: Reviewed and stable  Complications: No notable events documented.

## 2022-10-29 ENCOUNTER — Encounter: Payer: Self-pay | Admitting: Otolaryngology

## 2022-11-18 ENCOUNTER — Other Ambulatory Visit: Payer: Self-pay | Admitting: Family Medicine

## 2022-11-18 DIAGNOSIS — F5101 Primary insomnia: Secondary | ICD-10-CM

## 2022-11-18 DIAGNOSIS — G629 Polyneuropathy, unspecified: Secondary | ICD-10-CM

## 2022-11-18 MED ORDER — EZETIMIBE 10 MG PO TABS: 10.0000 mg | ORAL_TABLET | Freq: Every day | ORAL | 3 refills | Status: DC

## 2022-11-18 MED ORDER — OMEPRAZOLE 40 MG PO CPDR: 40.0000 mg | DELAYED_RELEASE_CAPSULE | Freq: Every day | ORAL | 3 refills | Status: DC

## 2022-11-18 MED ORDER — PREGABALIN 50 MG PO CAPS
50.0000 mg | ORAL_CAPSULE | Freq: Three times a day (TID) | ORAL | 3 refills | Status: DC
Start: 2022-11-18 — End: 2023-06-01

## 2022-11-18 MED ORDER — ZOLPIDEM TARTRATE ER 12.5 MG PO TBCR
12.5000 mg | EXTENDED_RELEASE_TABLET | Freq: Every day | ORAL | 1 refills | Status: DC
Start: 2022-11-18 — End: 2023-03-23

## 2022-11-18 NOTE — Addendum Note (Signed)
Addended by: Marjie Skiff on: 11/18/2022 11:55 AM   Modules accepted: Orders

## 2022-11-18 NOTE — Telephone Encounter (Signed)
This patient's employer has changed insurance Companies.  Therefore his meds have to be filled at [facility] on [address]    Please send new Rxs for  Zetia, Prilosec, Lyrica and Zolpidem to [facility] on [address]

## 2022-12-01 ENCOUNTER — Other Ambulatory Visit: Payer: Self-pay | Admitting: *Deleted

## 2022-12-01 DIAGNOSIS — C61 Malignant neoplasm of prostate: Secondary | ICD-10-CM

## 2022-12-09 ENCOUNTER — Other Ambulatory Visit: Payer: BC Managed Care – PPO

## 2022-12-10 ENCOUNTER — Other Ambulatory Visit: Payer: BC Managed Care – PPO

## 2022-12-14 ENCOUNTER — Telehealth: Payer: Self-pay | Admitting: Family Medicine

## 2022-12-14 NOTE — Telephone Encounter (Signed)
Due to patient's new pharmacy coverage, the patient must have a prior authorization on Ambien in order to get more than 15 pills at a time.  Can you please start the prior auth. Process?

## 2022-12-16 NOTE — Telephone Encounter (Signed)
PA created with Key number: [id number] Sent to plan today.

## 2022-12-18 NOTE — Telephone Encounter (Signed)
?   There was an error with your request   ?  Member Not Found

## 2022-12-18 NOTE — Telephone Encounter (Signed)
Resend to plan today waiting on response. [employer] is processing your PA request and will respond shortly with next step

## 2022-12-31 ENCOUNTER — Other Ambulatory Visit: Payer: BC Managed Care – PPO

## 2023-01-01 ENCOUNTER — Other Ambulatory Visit: Payer: BLUE CROSS/BLUE SHIELD

## 2023-01-01 DIAGNOSIS — C61 Malignant neoplasm of prostate: Secondary | ICD-10-CM

## 2023-01-02 LAB — PSA: Prostate Specific Ag, Serum: 0.2 ng/mL (ref 0.0–4.0)

## 2023-01-04 ENCOUNTER — Other Ambulatory Visit: Payer: Self-pay

## 2023-01-04 DIAGNOSIS — C61 Malignant neoplasm of prostate: Secondary | ICD-10-CM

## 2023-01-06 ENCOUNTER — Telehealth: Payer: Self-pay | Admitting: Family Medicine

## 2023-01-06 NOTE — Telephone Encounter (Signed)
Dale Gray wife called the # on the back of his [employer] card and this is the website they told her that we need to go to [email or web address] for the prior auth. On the Zolpidem CR. Can we please see if a prior auth can be done through this website?

## 2023-01-07 NOTE — Telephone Encounter (Signed)
Thank you for creating a prior authorization request using [employer]. The prior authorization department will contact you if additional information is needed. Once a decision is made you will be notified of the decision. You can check the status of your request using the Check Status link. Please note down (Prior Auth EOC ID) to check status. Prior Authorization request details: Prior Auth (EOC) ID: [id number] Drug/Service Name: ZOLPIDEM TART ER 12.5 MG TAB Patient: Dale Gray Date Requested: 01/07/2023 11:16:45 AM   MemberID: [id number] DOB: Jan 05, 1956

## 2023-02-19 ENCOUNTER — Other Ambulatory Visit: Payer: BLUE CROSS/BLUE SHIELD

## 2023-02-19 DIAGNOSIS — C61 Malignant neoplasm of prostate: Secondary | ICD-10-CM

## 2023-02-20 LAB — PSA: Prostate Specific Ag, Serum: 0.3 ng/mL (ref 0.0–4.0)

## 2023-03-23 ENCOUNTER — Ambulatory Visit: Payer: BLUE CROSS/BLUE SHIELD | Admitting: Urology

## 2023-03-23 VITALS — BP 152/76 | HR 49 | Ht 70.0 in | Wt 178.5 lb

## 2023-03-23 DIAGNOSIS — C61 Malignant neoplasm of prostate: Secondary | ICD-10-CM

## 2023-03-23 DIAGNOSIS — R972 Elevated prostate specific antigen [PSA]: Secondary | ICD-10-CM

## 2023-03-23 NOTE — Progress Notes (Signed)
 I,Dale Gray,acting as a scribe for [name], MD.,have documented all relevant documentation on the behalf of [name], MD,as directed by  [name], MD while in the presence of [name], MD.  03/23/2023 4:15 PM   Dale Gray 1955-06-20 [id number]  Referring provider: [name], DO [address] [place],  [place] [address]  Chief Complaint  Patient presents with   Elevated PSA    HPI: 68 year-old male presents for further discussion of rising PSA.  He is accompanied by several family members including by phone today.  He has a personal history of unfavorable intermediate risk prostate cancer with PSA persistence (+SV, +margin left base; pT3) status post prostatectomy, and ultimately salvage radiation with six months of Orgovix. This postoperative course was complicated by pelvic fluid collection, requiring percutaneous drainage, and ultimately this issue resolved. His PSA is undetectable as of 05/26/2022. He's been off of Eligard  for about fifteen months.    He had a follow-up with Interventional Radiology at [facility] in October 2023 with a CT abdomen pelvis with contrast that indicated interval resolution of his pelvic abscess.   He also has a personal history of erectile dysfunction and stress urinary incontinence.   His PSA has started to rise. It was undetectable on 05/26/22. Up to 0.2 on 01/01/23 and most recently 0.3 on 02/19/23.  He is accompanied today with his wife and they included their daughter via phone call.  He is doing well overall with no new symptoms or complaints.   PMH: Past Medical History:  Diagnosis Date   Actinic keratosis 09/17/2022   with ISK at left mid dorsal lat forearm bx proven   Anemia    Complication of anesthesia    hard time waking up   GERD (gastroesophageal reflux disease)    Hemorrhoid    Hyperlipidemia    Joint pain    Overbite    PONV (postoperative nausea and vomiting)    after 1st ear  surgery   Prostate cancer (HCC)    Prostate cancer (HCC)    Reflux    Seasonal allergies     Surgical History: Past Surgical History:  Procedure Laterality Date   ANAL FISSURE REPAIR  2014   CARPAL TUNNEL RELEASE Right    and elbow surgery   COLONOSCOPY WITH PROPOFOL  N/A 03/19/2017   Procedure: COLONOSCOPY WITH PROPOFOL ;  Surgeon: Janalyn Keene NOVAK, MD;  Location: [facility];  Service: Endoscopy;  Laterality: N/A;   COLONOSCOPY WITH PROPOFOL  N/A 08/20/2020   Procedure: COLONOSCOPY WITH PROPOFOL ;  Surgeon: Jinny Carmine, MD;  Location: [facility];  Service: Endoscopy;  Laterality: N/A;   ESOPHAGOGASTRODUODENOSCOPY (EGD) WITH PROPOFOL  N/A 08/20/2020   Procedure: ESOPHAGOGASTRODUODENOSCOPY (EGD) WITH PROPOFOL ;  Surgeon: Jinny Carmine, MD;  Location: [facility];  Service: Endoscopy;  Laterality: N/A;   ETHMOIDECTOMY Bilateral 10/28/2022   Procedure: ETHMOIDECTOMY;  Surgeon: Milissa Hamming, MD;  Location: [facility];  Service: ENT;  Laterality: Bilateral;   FRONTAL SINUS EXPLORATION Right 10/28/2022   Procedure: FRONTAL SINUS EXPLORATION;  Surgeon: Milissa Hamming, MD;  Location: [facility];  Service: ENT;  Laterality: Right;   IMAGE GUIDED SINUS SURGERY Bilateral 10/28/2022   Procedure: IMAGE GUIDED SINUS SURGERY;  Surgeon: Milissa Hamming, MD;  Location: [facility];  Service: ENT;  Laterality: Bilateral;   IR RADIOLOGIST EVAL & MGMT  07/21/2021   IR SINUS/FIST TUBE CHK-NON GI  07/25/2021   IR SINUS/FIST TUBE CHK-NON GI  08/01/2021   MAXILLARY ANTROSTOMY Bilateral 10/28/2022  Procedure: MAXILLARY ANTROSTOMY;  Surgeon: Milissa Hamming, MD;  Location: [facility];  Service: ENT;  Laterality: Bilateral;   PELVIC LYMPH NODE DISSECTION Bilateral 12/02/2020   Procedure: PELVIC LYMPH NODE DISSECTION;  Surgeon: Penne Knee, MD;  Location: [facility];  Service: Urology;  Laterality: Bilateral;   ROBOT ASSISTED LAPAROSCOPIC RADICAL PROSTATECTOMY  N/A 12/02/2020   Procedure: XI ROBOTIC ASSISTED LAPAROSCOPIC RADICAL PROSTATECTOMY;  Surgeon: Penne Knee, MD;  Location: [facility];  Service: Urology;  Laterality: N/A;   SPHENOIDECTOMY Left 10/28/2022   Procedure: SPHENOIDECTOMY;  Surgeon: Milissa Hamming, MD;  Location: [facility];  Service: ENT;  Laterality: Left;   TYMPANOPLASTY Left 04/30/2014   TYMPANOPLASTY  10/2014   VASECTOMY  01/06/2002    Home Medications:  Allergies as of 03/23/2023       Reactions   Lovastatin Other (See Comments)   Sleep disturbance and extremity pains        Medication List        Accurate as of March 23, 2023  4:15 PM. If you have any questions, ask your nurse or doctor.          STOP taking these medications    HYDROcodone -acetaminophen  5-325 MG tablet Commonly known as: Norco   ondansetron  4 MG tablet Commonly known as: Zofran    zolpidem  12.5 MG CR tablet Commonly known as: AMBIEN  CR       TAKE these medications    aspirin  81 MG tablet Take 81 mg by mouth daily.   ELDERBERRY PO Take by mouth.   ezetimibe  10 MG tablet Commonly known as: ZETIA  Take 1 tablet (10 mg total) by mouth daily.   fish oil-omega-3 fatty acids 1000 MG capsule Take 1 g by mouth daily.   Melatonin 10 MG Tabs Take by mouth.   multivitamin with minerals tablet Take 1 tablet by mouth daily.   omeprazole  40 MG capsule Commonly known as: PRILOSEC Take 1 capsule (40 mg total) by mouth daily. TAKE 1 CAPSULE(40 MG) BY MOUTH DAILY   pregabalin  50 MG capsule Commonly known as: LYRICA  Take 1 capsule (50 mg total) by mouth 3 (three) times daily.   tadalafil  5 MG tablet Commonly known as: CIALIS  TAKE 1 TABLET(5 MG) BY MOUTH DAILY AS NEEDED FOR ERECTILE DYSFUNCTION        Allergies:  Allergies  Allergen Reactions   Lovastatin Other (See Comments)    Sleep disturbance and extremity pains    Family History: Family History  Problem Relation Age of Onset   Heart disease Mother     Asthma Mother    Heart disease Father    CAD Father    Healthy Sister    Healthy Brother    Healthy Daughter    Healthy Daughter    Diabetes Maternal Aunt    Diabetes Maternal Uncle    Colon cancer Neg Hx     Social History:  reports that he has never smoked. He has never used smokeless tobacco. He reports that he does not drink alcohol and does not use drugs.   Physical Exam: BP (!) 152/76   Pulse (!) 49   Ht 5' 10 (1.778 m)   Wt 178 lb 8 oz (81 kg)   BMI 25.61 kg/m   Constitutional:  Alert and oriented, No acute distress. HEENT: [place] AT, moist mucus membranes.  Trachea midline, no masses. Neurologic: Grossly intact, no focal deficits, moving all 4 extremities. Psychiatric: Normal mood and affect.   Assessment & Plan:    1. Rising  PSA  - Concerning for persistent disease. It appears to be castrate sensitive as his PSA was undetectable when he was on Eiligard and for several months thereafter. Recommend re-initiation of ADT +/- one of the newer anti-testosterone  agents, or even a referral to medical oncology.  - Other possible options include watching PSA continue to rise, get a PET scan to see any cancer, and then treat it at that point. Once the cancer grows a bit, the areas it infects may be able to have radiation at that spot. However, he would still need to take the hormones along with this. He prefers to go with the first option. - Able to choose between Eligard  in 6 month dose or Orgovyx  daily pill. Decided to start with the Eligard  at this point in time. Additionally, will proceed with ordering Apalutamide  since it can take a month to get approved. Reading material given on this as well. Prior authorizations being sent to the [employer].  Return in about 6 months (around 09/20/2023) for PSA and Eligard .  I have reviewed the above documentation for accuracy and completeness, and I agree with the above.   [name], MD   [facility] [address] [place], [place] [address] [phone]

## 2023-04-18 ENCOUNTER — Encounter: Payer: Self-pay | Admitting: Urology

## 2023-04-19 NOTE — Telephone Encounter (Signed)
 Will send to [facility] and [place] to check on status of this before answering patient. Thank you

## 2023-04-26 ENCOUNTER — Encounter: Payer: Self-pay | Admitting: Urology

## 2023-05-03 ENCOUNTER — Ambulatory Visit: Payer: Self-pay | Admitting: Physician Assistant

## 2023-05-03 VITALS — BP 151/78 | HR 66 | Ht 70.0 in | Wt 178.0 lb

## 2023-05-03 DIAGNOSIS — C61 Malignant neoplasm of prostate: Secondary | ICD-10-CM | POA: Diagnosis not present

## 2023-05-03 MED ORDER — APALUTAMIDE 240 MG PO TABS: 240.0000 mg | ORAL_TABLET | Freq: Every day | ORAL | 11 refills | Status: DC

## 2023-05-03 MED ORDER — LEUPROLIDE ACETATE (6 MONTH) 45 MG ~~LOC~~ KIT
45.0000 mg | PACK | Freq: Once | SUBCUTANEOUS | Status: AC
Start: 2023-05-03 — End: 2023-05-03
  Administered 2023-05-03: 45 mg via SUBCUTANEOUS

## 2023-05-03 NOTE — Addendum Note (Signed)
 Addended by: Consuella Lose on: 05/03/2023 03:34 PM   Modules accepted: Orders

## 2023-05-03 NOTE — Progress Notes (Signed)
 Eligard SubQ Injection   Due to Prostate Cancer patient is present today for a Eligard Injection.  Medication: Eligard 6 month Dose: 45 mg  Location: right  Lot: [id number] Exp: 08/2024  Patient tolerated well, no complications were noted  Performed by: Ples Specter   Per Dr. Apolinar Junes  patient is to continue therapy for 6 months . Patient's next follow up was scheduled for 10/28/2023. This appointment was scheduled using wheel and given to patient today along with reminder continue on Vitamin D 800-1000iu and Calcium 1000-1200mg  daily while on Androgen Deprivation Therapy.   Checking status of Erleada prior auth; will notify patient when available. Carman Ching, PA-C

## 2023-05-03 NOTE — Patient Instructions (Signed)
 Please take the following dietary supplements for the duration of your hormone suppression therapy to reduce your risk for bone loss: -Calcium 1000-1200mg  daily -Vitamin D 800-1000IU daily

## 2023-05-13 ENCOUNTER — Telehealth: Payer: Self-pay

## 2023-05-13 NOTE — Telephone Encounter (Signed)
 Submitted PA for Erleada via covermymeds. Pending Key: [id number]

## 2023-05-18 NOTE — Telephone Encounter (Signed)
 PA denied for Erleada due to patient has non metastatic castration sensitive prostate cancer and insurance will only cover for diagnoses of non metastatic castration-resistant prostate cancer or metastatic castration-sensitive prostate cancer . Fax scanned in media section

## 2023-05-21 ENCOUNTER — Encounter: Payer: Self-pay | Admitting: Urology

## 2023-05-21 ENCOUNTER — Ambulatory Visit: Payer: BLUE CROSS/BLUE SHIELD | Admitting: Family Medicine

## 2023-05-21 ENCOUNTER — Encounter: Payer: Self-pay | Admitting: Family Medicine

## 2023-05-21 ENCOUNTER — Telehealth: Payer: Self-pay

## 2023-05-21 VITALS — BP 124/73 | HR 56 | Ht 70.0 in | Wt 183.0 lb

## 2023-05-21 DIAGNOSIS — C61 Malignant neoplasm of prostate: Secondary | ICD-10-CM

## 2023-05-21 DIAGNOSIS — Z0001 Encounter for general adult medical examination with abnormal findings: Secondary | ICD-10-CM | POA: Diagnosis not present

## 2023-05-21 DIAGNOSIS — E7800 Pure hypercholesterolemia, unspecified: Secondary | ICD-10-CM

## 2023-05-21 DIAGNOSIS — D509 Iron deficiency anemia, unspecified: Secondary | ICD-10-CM

## 2023-05-21 DIAGNOSIS — N182 Chronic kidney disease, stage 2 (mild): Secondary | ICD-10-CM

## 2023-05-21 DIAGNOSIS — K219 Gastro-esophageal reflux disease without esophagitis: Secondary | ICD-10-CM | POA: Diagnosis not present

## 2023-05-21 DIAGNOSIS — G5602 Carpal tunnel syndrome, left upper limb: Secondary | ICD-10-CM | POA: Insufficient documentation

## 2023-05-21 DIAGNOSIS — Z0000 Encounter for general adult medical examination without abnormal findings: Secondary | ICD-10-CM | POA: Insufficient documentation

## 2023-05-21 NOTE — Progress Notes (Signed)
 Complete physical exam   Patient: Dale Gray   DOB: [birth year]   68 y.o. Male  MRN: [id number] Visit Date: 05/21/2023  Today's healthcare provider: Sherlyn Hay, DO   Chief Complaint  Patient presents with   Annual Exam    No concerns    Subjective    Dale Gray is a 68 y.o. male who presents today for a complete physical exam.  He reports consuming a general diet. The patient has a physically strenuous job, but has no regular exercise apart from work.  He generally feels fairly well. He reports sleeping fairly well. He does not have additional problems to discuss today.  HPI HPI     Annual Exam    Additional comments: No concerns       Last edited by Thedora Hinders, CMA on 05/21/2023  8:20 AM.      Dale Gray "TIM" is a 68 year old male who presents for an annual physical exam.  He has no specific concerns at this time. He has a history of prostate cancer and is currently undergoing hormone treatment, experiencing hot flashes at night as a side effect. He is scheduled to continue the hormone treatment for six months, after which further testing will be conducted.  He has a history of anemia from 2023.  He has a history of high cholesterol and is mindful of his diet to manage it, trying to avoid foods that could exacerbate his condition.  He experiences some difficulty with vision, attributing it to aging. He also reports numbness in his left hand, which he associates with carpal tunnel syndrome, having had similar issues with his right hand previously.  He takes omeprazole daily.   Past Medical History:  Diagnosis Date   Actinic keratosis 09/17/2022   with ISK at left mid dorsal lat forearm bx proven   Anemia    Complication of anesthesia    hard time waking up   GERD (gastroesophageal reflux disease)    Hemorrhoid    Hyperlipidemia    Joint pain    Overbite    PONV (postoperative nausea and vomiting)    after 1st ear surgery    Prostate cancer (HCC)    Prostate cancer (HCC)    Reflux    Seasonal allergies    Past Surgical History:  Procedure Laterality Date   ANAL FISSURE REPAIR  2014   CARPAL TUNNEL RELEASE Right    and elbow surgery   COLONOSCOPY WITH PROPOFOL N/A 03/19/2017   Procedure: COLONOSCOPY WITH PROPOFOL;  Surgeon: Pasty Spillers, MD;  Location: [facility] ENDOSCOPY;  Service: Endoscopy;  Laterality: N/A;   COLONOSCOPY WITH PROPOFOL N/A 08/20/2020   Procedure: COLONOSCOPY WITH PROPOFOL;  Surgeon: Midge Minium, MD;  Location: [facility] END[facility]COPY;  Service: Endoscopy;  Laterality: N/A;   ESOPHAGOGASTRODUODENOSCOPY (EGD) WITH PROPOFOL N/A 08/20/2020   Procedure: ESOPHAGOGASTRODUODENOSCOPY (EGD) WITH PROPOFOL;  Surgeon: Midge Minium, MD;  Location: [facility] ENDOSCOPY;  Service: Endoscopy;  Laterality: N/A;   ETHMOIDECTOMY Bilateral 10/28/2022   Procedure: ETHMOIDECTOMY;  Surgeon: Bud Face, MD;  Location: [facility];  Service: ENT;  Laterality: Bilateral;   FRONTAL SINUS EXPLORATION Right 10/28/2022   Procedure: FRONTAL SINUS EXPLORATION;  Surgeon: Bud Face, MD;  Location: [facility];  Service: ENT;  Laterality: Right;   IMAGE GUIDED SINUS SURGERY Bilateral 10/28/2022   Procedure: IMAGE GUIDED SINUS SURGERY;  Surgeon: Bud Face, MD;  Location: [facility];  Service: ENT;  Laterality: Bilateral;  IR RADIOLOGIST EVAL & MGMT  07/21/2021   IR SINUS/FIST TUBE CHK-NON GI  07/25/2021   IR SINUS/FIST TUBE CHK-NON GI  08/01/2021   MAXILLARY ANTROSTOMY Bilateral 10/28/2022   Procedure: MAXILLARY ANTROSTOMY;  Surgeon: Bud Face, MD;  Location: [facility];  Service: ENT;  Laterality: Bilateral;   PELVIC LYMPH NODE DISSECTION Bilateral 12/02/2020   Procedure: PELVIC LYMPH NODE DISSECTION;  Surgeon: Vanna Scotland, MD;  Location: [facility] ORS;  Service: Urology;  Laterality: Bilateral;   ROBOT ASSISTED LAPAROSCOPIC RADICAL PROSTATECTOMY N/A 12/02/2020   Procedure: XI  ROBOTIC ASSISTED LAPAROSCOPIC RADICAL PROSTATECTOMY;  Surgeon: Vanna Scotland, MD;  Location: [facility] ORS;  Service: Urology;  Laterality: N/A;   SPHENOIDECTOMY Left 10/28/2022   Procedure: SPHENOIDECTOMY;  Surgeon: Bud Face, MD;  Location: [facility];  Service: ENT;  Laterality: Left;   TYMPANOPLASTY Left 04/30/2014   TYMPANOPLASTY  10/2014   VASECTOMY  01/06/2002   Social History   Socioeconomic History   Marital status: Married    Spouse name: Not on file   Number of children: Not on file   Years of education: Not on file   Highest education level: Not on file  Occupational History   Not on file  Tobacco Use   Smoking status: Never   Smokeless tobacco: Never  Vaping Use   Vaping status: Never Used  Substance and Sexual Activity   Alcohol use: No   Drug use: No   Sexual activity: Not on file  Other Topics Concern   Not on file  Social History Narrative   Not on file   Social Drivers of Health   Financial Resource Strain: Low Risk  (05/21/2023)   Overall Financial Resource Strain (CARDIA)    Difficulty of Paying Living Expenses: Not hard at all  Food Insecurity: No Food Insecurity (05/21/2023)   Hunger Vital Sign    Worried About Running Out of Food in the Last Year: Never true    Ran Out of Food in the Last Year: Never true  Transportation Needs: No Transportation Needs (05/21/2023)   PRAPARE - Administrator, Civil Service (Medical): No    Lack of Transportation (Non-Medical): No  Physical Activity: Not on file  Stress: No Stress Concern Present (05/21/2023)   [employer] of Occupational Health - Occupational Stress Questionnaire    Feeling of Stress : Not at all  Social Connections: Not on file  Intimate Partner Violence: Not At Risk (05/21/2023)   Humiliation, Afraid, Rape, and Kick questionnaire    Fear of Current or Ex-Partner: No    Emotionally Abused: No    Physically Abused: No    Sexually Abused: No   Family Status   Relation Name Status   Mother  Alive   Father  Alive   Sister  Alive   Brother  Alive   Daughter  Alive   Daughter  Alive   Mat Aunt  (Not Specified)   Nurse, mental health  (Not Specified)   Neg Hx  (Not Specified)  No partnership data on file   Family History  Problem Relation Age of Onset   Heart disease Mother    Asthma Mother    Heart disease Father    CAD Father    Healthy Sister    Healthy Brother    Healthy Daughter    Healthy Daughter    Diabetes Maternal Aunt    Diabetes Maternal Uncle    Colon cancer Neg Hx    Allergies  Allergen Reactions  Lovastatin Other (See Comments)    Sleep disturbance and extremity pains    Patient Care Team: Sherlyn Hay, DO as PCP - General (Family Medicine) Vanna Scotland, MD as Consulting Physician (Urology) Carmina Miller, MD as Consulting Physician (Radiation Oncology)   Medications: Outpatient Medications Prior to Visit  Medication Sig   apalutamide (ERLEADA) 240 MG tablet Take 1 tablet (240 mg total) by mouth daily.   aspirin 81 MG tablet Take 81 mg by mouth daily.   ELDERBERRY PO Take by mouth.   ezetimibe (ZETIA) 10 MG tablet Take 1 tablet (10 mg total) by mouth daily.   fish oil-omega-3 fatty acids 1000 MG capsule Take 1 g by mouth daily.   Melatonin 10 MG TABS Take by mouth.   Multiple Vitamins-Minerals (MULTIVITAMIN WITH MINERALS) tablet Take 1 tablet by mouth daily.   omeprazole (PRILOSEC) 40 MG capsule Take 1 capsule (40 mg total) by mouth daily. TAKE 1 CAPSULE(40 MG) BY MOUTH DAILY   pregabalin (LYRICA) 50 MG capsule Take 1 capsule (50 mg total) by mouth 3 (three) times daily.   tadalafil (CIALIS) 5 MG tablet TAKE 1 TABLET(5 MG) BY MOUTH DAILY AS NEEDED FOR ERECTILE DYSFUNCTION   No facility-administered medications prior to visit.    Review of Systems  Constitutional:  Negative for appetite change, chills, fatigue and fever.  HENT:  Negative for congestion, ear pain, hearing loss, nosebleeds and trouble  swallowing.   Eyes:  Negative for pain and visual disturbance.  Respiratory:  Negative for cough, chest tightness and shortness of breath.   Cardiovascular:  Negative for chest pain, palpitations and leg swelling.  Gastrointestinal:  Negative for abdominal pain, blood in stool, constipation, diarrhea, nausea and vomiting.  Endocrine: Negative for polydipsia, polyphagia and polyuria.  Genitourinary:  Negative for dysuria and flank pain.  Musculoskeletal:  Negative for arthralgias, back pain, joint swelling, myalgias and neck stiffness.  Skin:  Negative for color change, rash and wound.  Neurological:  Negative for dizziness, tremors, seizures, speech difficulty, weakness, light-headedness and headaches.  Psychiatric/Behavioral:  Negative for behavioral problems, confusion, decreased concentration, dysphoric mood and sleep disturbance. The patient is not nervous/anxious.   All other systems reviewed and are negative.     Objective    BP 124/73   Pulse (!) 56   Ht 5\' 10"  (1.778 m)   Wt 183 lb (83 kg)   SpO2 100%   BMI 26.26 kg/m    Physical Exam Vitals and nursing note reviewed.  Constitutional:      General: He is awake.     Appearance: Normal appearance.  HENT:     Head: Normocephalic and atraumatic.     Right Ear: Ear canal and external ear normal.     Left Ear: Ear canal and external ear normal. Tympanic membrane is scarred.     Nose: Nose normal.     Mouth/Throat:     Mouth: Mucous membranes are moist.     Pharynx: Oropharynx is clear. No oropharyngeal exudate or posterior oropharyngeal erythema.  Eyes:     General: No scleral icterus.    Extraocular Movements: Extraocular movements intact.     Conjunctiva/sclera: Conjunctivae normal.     Pupils: Pupils are equal, round, and reactive to light.  Neck:     Thyroid: No thyromegaly or thyroid tenderness.  Cardiovascular:     Rate and Rhythm: Normal rate and regular rhythm.     Pulses: Normal pulses.     Heart sounds:  Normal heart sounds.  Pulmonary:  Effort: Pulmonary effort is normal. No tachypnea, bradypnea or respiratory distress.     Breath sounds: Normal breath sounds. No stridor. No wheezing, rhonchi or rales.  Abdominal:     General: Bowel sounds are normal. There is no distension.     Palpations: Abdomen is soft. There is no mass.     Tenderness: There is no abdominal tenderness. There is no guarding.     Hernia: No hernia is present.  Musculoskeletal:     Cervical back: Normal range of motion and neck supple.     Right lower leg: No edema.     Left lower leg: No edema.  Lymphadenopathy:     Cervical: No cervical adenopathy.  Skin:    General: Skin is warm and dry.  Neurological:     Mental Status: He is alert and oriented to person, place, and time. Mental status is at baseline.  Psychiatric:        Mood and Affect: Mood normal.        Behavior: Behavior normal.      Last depression screening scores    05/21/2023    8:29 AM 05/19/2022    9:07 AM 05/14/2021    8:45 AM  PHQ 2/9 Scores  PHQ - 2 Score 0 0 0  PHQ- 9 Score 2 0 2   Last fall risk screening    05/19/2022    9:07 AM  Fall Risk   Falls in the past year? 0  Number falls in past yr: 0  Injury with Fall? 0  Risk for fall due to : No Fall Risks  Follow up Falls evaluation completed   Last Audit-C alcohol use screening    05/21/2023    8:22 AM  Alcohol Use Disorder Test (AUDIT)  1. How often do you have a drink containing alcohol? 0  2. How many drinks containing alcohol do you have on a typical day when you are drinking? 0  3. How often do you have six or more drinks on one occasion? 0  AUDIT-C Score 0   A score of 3 or more in women, and 4 or more in men indicates increased risk for alcohol abuse, EXCEPT if all of the points are from question 1   No results found for any visits on 05/21/23.  Assessment & Plan    Routine Health Maintenance and Physical Exam  Exercise Activities and Dietary recommendations   Goals   None     Immunization History  Administered Date(s) Administered   Influenza,inj,Quad PF,6+ Mos 12/13/2015, 11/26/2016, 01/25/2018, 01/03/2019, 01/04/2020, 12/03/2020   PFIZER(Purple Top)SARS-COV-2 Vaccination 11/07/2019, 12/05/2019   Td 02/18/2017   Tdap 05/26/2006   Zoster Recombinant(Shingrix) 03/10/2018, 05/09/2018    Health Maintenance  Topic Date Due   Pneumonia Vaccine 29+ Years old (1 of 2 - PCV) 05/24/2023 (Originally 11/24/1961)   INFLUENZA VACCINE  06/07/2023 (Originally 10/08/2022)   COVID-19 Vaccine (3 - Pfizer risk series) 12/08/2023 (Originally 01/02/2020)   Colonoscopy  08/20/2025   DTaP/Tdap/Td (3 - Td or Tdap) 02/19/2027   Hepatitis C Screening  Completed   HIV Screening  Completed   Zoster Vaccines- Shingrix  Completed   HPV VACCINES  Aged Out    Discussed health benefits of physical activity, and encouraged him to engage in regular exercise appropriate for his age and condition.   Annual physical exam Assessment & Plan: Physical exam overall unremarkable except as noted above. Routine lab work ordered as noted. Declined flu and COVID vaccines. Advised to check  with oncologist regarding vaccine safety due to ongoing hormone therapy for prostate cancer. - Discuss availability of high-dose flu vaccine - Offer to schedule vaccine appointment if oncologist approves, or patient may obtain Prevnar-20 at his local pharmacy   Gastroesophageal reflux disease, unspecified whether esophagitis present -     Vitamin B12  Iron deficiency anemia, unspecified iron deficiency anemia type -     CBC  Pure hypercholesterolemia Assessment & Plan: Manages hyperlipidemia through dietary choices and ezetimibe. - Order cholesterol panel   Orders: -     Lipid panel  Chronic kidney disease, stage 2, mildly decreased GFR Assessment & Plan: Chronic kidney disease stage 2 with a glomerular filtration rate between 60 and 90. Advised against NSAID use to prevent further  kidney damage. - Order complete blood count, urine test for proteinuria, and vitamin D level test - Advise against chronic use of NSAIDs like ibuprofen, Aleve, aspirin, and BC powder  Orders: -     Microalbumin / creatinine urine ratio -     Comprehensive metabolic panel -     VITAMIN D 25 Hydroxy (Vit-D Deficiency, Fractures)  Prostate cancer (HCC) Assessment & Plan: Undergoing hormone therapy with a six-month course of Erleada. Experiences hot flashes as a side effect. Vaccination status requires oncologist's input due to ongoing treatment. - Contact oncologist for advice on vaccine safety - Schedule vaccine appointment if oncologist approves Managed by oncolongy; defer to specialist's management.    Carpal tunnel syndrome of left wrist Assessment & Plan: Reports numbness and tingling in the left hand, consistent with carpal tunnel syndrome. History of carpal tunnel release surgery on the right hand.      Return in about 1 year (around 05/20/2024) for CPE.     I discussed the assessment and treatment plan with the patient  The patient was provided an opportunity to ask questions and all were answered. The patient agreed with the plan and demonstrated an understanding of the instructions.   The patient was advised to call back or seek an in-person evaluation if the symptoms worsen or if the condition fails to improve as anticipated.    Sherlyn Hay, DO  [facility] [phone] (phone) [phone] (fax)  [facility]

## 2023-05-21 NOTE — Telephone Encounter (Signed)
 Called [facility] and advised them to cancel this prescription request as patient's insurance did not cover it

## 2023-05-21 NOTE — Assessment & Plan Note (Signed)
 Physical exam overall unremarkable except as noted above. Routine lab work ordered as noted. Declined flu and COVID vaccines. Advised to check with oncologist regarding vaccine safety due to ongoing hormone therapy for prostate cancer. - Discuss availability of high-dose flu vaccine - Offer to schedule vaccine appointment if oncologist approves, or patient may obtain Prevnar-20 at his local pharmacy

## 2023-05-21 NOTE — Assessment & Plan Note (Signed)
 Reports numbness and tingling in the left hand, consistent with carpal tunnel syndrome. History of carpal tunnel release surgery on the right hand.

## 2023-05-21 NOTE — Assessment & Plan Note (Signed)
 Manages hyperlipidemia through dietary choices and ezetimibe. - Order cholesterol panel

## 2023-05-21 NOTE — Assessment & Plan Note (Signed)
 Chronic kidney disease stage 2 with a glomerular filtration rate between 60 and 90. Advised against NSAID use to prevent further kidney damage. - Order complete blood count, urine test for proteinuria, and vitamin D level test - Advise against chronic use of NSAIDs like ibuprofen, Aleve, aspirin, and BC powder

## 2023-05-21 NOTE — Telephone Encounter (Signed)
 Incoming call on triage line from pharmacy tech with [facility] stating that they received a referral for Erleada but not an RX. Please advise. Callback # [phone].

## 2023-05-21 NOTE — Assessment & Plan Note (Signed)
 Undergoing hormone therapy with a six-month course of Erleada. Experiences hot flashes as a side effect. Vaccination status requires oncologist's input due to ongoing treatment. - Contact oncologist for advice on vaccine safety - Schedule vaccine appointment if oncologist approves Managed by oncolongy; defer to specialist's management.

## 2023-05-22 LAB — COMPREHENSIVE METABOLIC PANEL
ALT: 20 IU/L (ref 0–44)
AST: 25 IU/L (ref 0–40)
Albumin: 4.5 g/dL (ref 3.9–4.9)
Alkaline Phosphatase: 86 IU/L (ref 44–121)
BUN/Creatinine Ratio: 13 (ref 10–24)
BUN: 15 mg/dL (ref 8–27)
Bilirubin Total: 0.5 mg/dL (ref 0.0–1.2)
CO2: 23 mmol/L (ref 20–29)
Calcium: 9.3 mg/dL (ref 8.6–10.2)
Chloride: 104 mmol/L (ref 96–106)
Creatinine, Ser: 1.19 mg/dL (ref 0.76–1.27)
Globulin, Total: 2.2 g/dL (ref 1.5–4.5)
Glucose: 95 mg/dL (ref 70–99)
Potassium: 4.8 mmol/L (ref 3.5–5.2)
Sodium: 143 mmol/L (ref 134–144)
Total Protein: 6.7 g/dL (ref 6.0–8.5)
eGFR: 68 mL/min/{1.73_m2} (ref >59)

## 2023-05-22 LAB — CBC
Hematocrit: 41.9 % (ref 37.5–51.0)
Hemoglobin: 14.2 g/dL (ref 13.0–17.7)
MCH: 30.4 pg (ref 26.6–33.0)
MCHC: 33.9 g/dL (ref 31.5–35.7)
MCV: 90 fL (ref 79–97)
Platelets: 308 10*3/uL (ref 150–450)
RBC: 4.67 x10E6/uL (ref 4.14–5.80)
RDW: 13.4 % (ref 11.6–15.4)
WBC: 5.2 10*3/uL (ref 3.4–10.8)

## 2023-05-22 LAB — VITAMIN D 25 HYDROXY (VIT D DEFICIENCY, FRACTURES): Vit D, 25-Hydroxy: 33.8 ng/mL (ref 30.0–100.0)

## 2023-05-22 LAB — LIPID PANEL
Chol/HDL Ratio: 4.9 ratio (ref 0.0–5.0)
Cholesterol, Total: 231 mg/dL — ABNORMAL HIGH (ref 100–199)
HDL: 47 mg/dL (ref >39)
LDL Chol Calc (NIH): 161 mg/dL — ABNORMAL HIGH (ref 0–99)
Triglycerides: 130 mg/dL (ref 0–149)
VLDL Cholesterol Cal: 23 mg/dL (ref 5–40)

## 2023-05-22 LAB — MICROALBUMIN / CREATININE URINE RATIO
Creatinine, Urine: 132 mg/dL
Microalb/Creat Ratio: 10 mg/g{creat} (ref 0–29)
Microalbumin, Urine: 12.9 ug/mL

## 2023-05-22 LAB — VITAMIN B12: Vitamin B-12: 874 pg/mL (ref 232–1245)

## 2023-05-24 ENCOUNTER — Other Ambulatory Visit: Payer: Self-pay | Admitting: Family Medicine

## 2023-05-24 ENCOUNTER — Encounter: Payer: Self-pay | Admitting: Family Medicine

## 2023-05-24 DIAGNOSIS — E7800 Pure hypercholesterolemia, unspecified: Secondary | ICD-10-CM

## 2023-05-31 ENCOUNTER — Telehealth: Payer: Self-pay | Admitting: Family Medicine

## 2023-05-31 DIAGNOSIS — G629 Polyneuropathy, unspecified: Secondary | ICD-10-CM

## 2023-05-31 NOTE — Telephone Encounter (Signed)
 Patient is asking for refills on Pregabalin 50 mg. To be [facility] to [facility] on [address].

## 2023-06-01 ENCOUNTER — Other Ambulatory Visit: Payer: BC Managed Care – PPO

## 2023-06-01 DIAGNOSIS — C61 Malignant neoplasm of prostate: Secondary | ICD-10-CM

## 2023-06-01 MED ORDER — PREGABALIN 50 MG PO CAPS
50.0000 mg | ORAL_CAPSULE | Freq: Three times a day (TID) | ORAL | 3 refills | Status: DC
Start: 2023-06-01 — End: 2023-11-30

## 2023-06-01 NOTE — Addendum Note (Signed)
 Addended by: Janey Greaser D on: 06/01/2023 10:28 AM   Modules accepted: Orders

## 2023-06-01 NOTE — Addendum Note (Signed)
 Addended by: Jacquenette Shone on: 06/01/2023 05:25 PM   Modules accepted: Orders

## 2023-06-02 LAB — PSA: Prostate Specific Ag, Serum: <0.1 ng/mL (ref 0.0–4.0)

## 2023-06-04 ENCOUNTER — Encounter: Payer: Self-pay | Admitting: Urology

## 2023-06-09 ENCOUNTER — Ambulatory Visit: Payer: BC Managed Care – PPO | Admitting: Urology

## 2023-06-15 ENCOUNTER — Ambulatory Visit: Payer: BC Managed Care – PPO | Admitting: Urology

## 2023-06-17 ENCOUNTER — Other Ambulatory Visit: Payer: Self-pay | Admitting: Urology

## 2023-06-21 ENCOUNTER — Other Ambulatory Visit: Payer: BLUE CROSS/BLUE SHIELD

## 2023-07-23 ENCOUNTER — Other Ambulatory Visit: Payer: Self-pay | Admitting: Otolaryngology

## 2023-07-23 DIAGNOSIS — J383 Other diseases of vocal cords: Secondary | ICD-10-CM

## 2023-07-23 DIAGNOSIS — R490 Dysphonia: Secondary | ICD-10-CM

## 2023-07-23 DIAGNOSIS — J329 Chronic sinusitis, unspecified: Secondary | ICD-10-CM

## 2023-07-29 ENCOUNTER — Ambulatory Visit
Admission: RE | Admit: 2023-07-29 | Discharge: 2023-07-29 | Disposition: A | Discharge status: Home / Self Care | Source: Ambulatory Visit | Attending: Otolaryngology | Admitting: Otolaryngology

## 2023-07-29 DIAGNOSIS — R490 Dysphonia: Secondary | ICD-10-CM

## 2023-07-29 DIAGNOSIS — J383 Other diseases of vocal cords: Secondary | ICD-10-CM

## 2023-07-29 DIAGNOSIS — J329 Chronic sinusitis, unspecified: Secondary | ICD-10-CM

## 2023-07-29 MED ORDER — IOPAMIDOL (ISOVUE-300) INJECTION 61%
75.0000 mL | Freq: Once | INTRAVENOUS | Status: AC | PRN
  Administered 2023-07-29: 75 mL via INTRAVENOUS

## 2023-10-13 ENCOUNTER — Telehealth: Payer: Self-pay | Admitting: Family Medicine

## 2023-10-13 NOTE — Telephone Encounter (Signed)
 Form signed by provider to be faxed

## 2023-10-13 NOTE — Telephone Encounter (Signed)
 Patient sent in in physical form to be completed from [employer].  Form is in Dr. Paulina mailbox.

## 2023-10-28 ENCOUNTER — Encounter: Payer: Self-pay | Admitting: Physician Assistant

## 2023-10-28 ENCOUNTER — Ambulatory Visit: Payer: BC Managed Care – PPO | Admitting: Physician Assistant

## 2023-10-28 ENCOUNTER — Ambulatory Visit: Payer: BC Managed Care – PPO | Admitting: Dermatology

## 2023-10-28 VITALS — BP 129/80 | HR 80 | Ht 70.0 in | Wt 175.0 lb

## 2023-10-28 DIAGNOSIS — C61 Malignant neoplasm of prostate: Secondary | ICD-10-CM | POA: Diagnosis not present

## 2023-10-28 MED ORDER — LEUPROLIDE ACETATE (6 MONTH) 45 MG ~~LOC~~ KIT
45.0000 mg | PACK | Freq: Once | SUBCUTANEOUS | Status: AC
Start: 2023-10-28 — End: 2023-10-28
  Administered 2023-10-28: 45 mg via SUBCUTANEOUS

## 2023-10-28 NOTE — Progress Notes (Signed)
 Eligard  SubQ Injection   Due to Prostate Cancer patient is present today for a Eligard  Injection.  Medication: Eligard  6 month Dose: 45 mg  Location: right lower abdomen Lot: [id number] Exp: 12/07/2023  Patient tolerated well, no complications were noted  Performed by: Beauford Browner, CCMA  Per Lucie Hones patient is to continue therapy for 6 months . Patient's next follow up was scheduled for 04/29/2023. This appointment was scheduled using wheel and given to patient today along with reminder continue on Vitamin D  800-1000iu and Calcium 1000-1200mg  daily while on Androgen Deprivation Therapy.  PA approval dates:

## 2023-10-28 NOTE — Progress Notes (Signed)
 10/28/2023 12:46 PM   Dale Gray December 28, 1955 [id number]  CC: Chief Complaint  Patient presents with   Prostate Cancer   HPI: Dale Gray is a 68 y.o. male with PMH unfavorable intermediate risk prostate cancer s/p prostatectomy in 2022 with persistent postop PSA, now s/p salvage radiation in 2023 with biochemical recurrence in 2024 now back on ADT and post prostatectomy stress incontinence s/p artificial urethral sphincter with Dr. Jillyn in 2020 for who presents today for 54-month Eligard .   Today he reports bothersome hot flashes, however he does not want to take additional medications at this time.  He remains on calcium and vitamin D  supplements.  No acute concerns today.  Most recent PSA in March was undetectable.  PMH: Past Medical History:  Diagnosis Date   Actinic keratosis 09/17/2022   with ISK at left mid dorsal lat forearm bx proven   Anemia    Complication of anesthesia    hard time waking up   GERD (gastroesophageal reflux disease)    Hemorrhoid    Hyperlipidemia    Joint pain    Overbite    PONV (postoperative nausea and vomiting)    after 1st ear surgery   Prostate cancer (HCC)    Prostate cancer (HCC)    Reflux    Seasonal allergies     Surgical History: Past Surgical History:  Procedure Laterality Date   ANAL FISSURE REPAIR  2014   CARPAL TUNNEL RELEASE Right    and elbow surgery   COLONOSCOPY WITH PROPOFOL  N/A 03/19/2017   Procedure: COLONOSCOPY WITH PROPOFOL ;  Surgeon: Janalyn Keene NOVAK, MD;  Location: [facility] ENDOSCOPY;  Service: Endoscopy;  Laterality: N/A;   COLONOSCOPY WITH PROPOFOL  N/A 08/20/2020   Procedure: COLONOSCOPY WITH PROPOFOL ;  Surgeon: Jinny Carmine, MD;  Location: [facility] ENDOSCOPY;  Service: Endoscopy;  Laterality: N/A;   ESOPHAGOGASTRODUODENOSCOPY (EGD) WITH PROPOFOL  N/A 08/20/2020   Procedure: ESOPHAGOGASTRODUODENOSCOPY (EGD) WITH PROPOFOL ;  Surgeon: Jinny Carmine, MD;  Location: [facility] ENDOSCOPY;  Service: Endoscopy;   Laterality: N/A;   ETHMOIDECTOMY Bilateral 10/28/2022   Procedure: ETHMOIDECTOMY;  Surgeon: Milissa Hamming, MD;  Location: [facility];  Service: ENT;  Laterality: Bilateral;   FRONTAL SINUS EXPLORATION Right 10/28/2022   Procedure: FRONTAL SINUS EXPLORATION;  Surgeon: Milissa Hamming, MD;  Location: [facility];  Service: ENT;  Laterality: Right;   IMAGE GUIDED SINUS SURGERY Bilateral 10/28/2022   Procedure: IMAGE GUIDED SINUS SURGERY;  Surgeon: Milissa Hamming, MD;  Location: [facility];  Service: ENT;  Laterality: Bilateral;   IR RADIOLOGIST EVAL & MGMT  07/21/2021   IR SINUS/FIST TUBE CHK-NON GI  07/25/2021   IR SINUS/FIST TUBE CHK-NON GI  08/01/2021   MAXILLARY ANTROSTOMY Bilateral 10/28/2022   Procedure: MAXILLARY ANTROSTOMY;  Surgeon: Milissa Hamming, MD;  Location: [facility];  Service: ENT;  Laterality: Bilateral;   PELVIC LYMPH NODE DISSECTION Bilateral 12/02/2020   Procedure: PELVIC LYMPH NODE DISSECTION;  Surgeon: Penne Knee, MD;  Location: [facility] ORS;  Service: Urology;  Laterality: Bilateral;   ROBOT ASSISTED LAPAROSCOPIC RADICAL PROSTATECTOMY N/A 12/02/2020   Procedure: XI ROBOTIC ASSISTED LAPAROSCOPIC RADICAL PROSTATECTOMY;  Surgeon: Penne Knee, MD;  Location: [facility] ORS;  Service: Urology;  Laterality: N/A;   SPHENOIDECTOMY Left 10/28/2022   Procedure: SPHENOIDECTOMY;  Surgeon: Milissa Hamming, MD;  Location: [facility];  Service: ENT;  Laterality: Left;   TYMPANOPLASTY Left 04/30/2014   TYMPANOPLASTY  10/2014   VASECTOMY  01/06/2002    Home Medications:  Allergies as of 10/28/2023  Reactions   Lovastatin Other (See Comments)   Sleep disturbance and extremity pains        Medication List        Accurate as of October 28, 2023 12:46 PM. If you have any questions, ask your nurse or doctor.          STOP taking these medications    apalutamide  240 MG tablet Commonly known as: Erleada  Stopped by: Lucie Hones       TAKE these medications    aspirin  81 MG tablet Take 81 mg by mouth daily.   ELDERBERRY PO Take by mouth.   ezetimibe  10 MG tablet Commonly known as: ZETIA  Take 1 tablet (10 mg total) by mouth daily.   fish oil-omega-3 fatty acids 1000 MG capsule Take 1 g by mouth daily.   Melatonin 10 MG Tabs Take by mouth.   multivitamin with minerals tablet Take 1 tablet by mouth daily.   omeprazole  40 MG capsule Commonly known as: PRILOSEC Take 1 capsule (40 mg total) by mouth daily. TAKE 1 CAPSULE(40 MG) BY MOUTH DAILY   pregabalin  50 MG capsule Commonly known as: LYRICA  Take 1 capsule (50 mg total) by mouth 3 (three) times daily.   tadalafil  5 MG tablet Commonly known as: CIALIS  TAKE 1 TABLET BY MOUTH EVERY DAY ERECTILE DYSFUNCTION        Allergies:  Allergies  Allergen Reactions   Lovastatin Other (See Comments)    Sleep disturbance and extremity pains    Family History: Family History  Problem Relation Age of Onset   Heart disease Mother    Asthma Mother    Heart disease Father    CAD Father    Healthy Sister    Healthy Brother    Healthy Daughter    Healthy Daughter    Diabetes Maternal Aunt    Diabetes Maternal Uncle    Colon cancer Neg Hx     Social History:   reports that he has never smoked. He has never used smokeless tobacco. He reports that he does not drink alcohol and does not use drugs.  Physical Exam: BP 129/80 (BP Location: Left Arm, Patient Position: Sitting, Cuff Size: Normal)   Pulse 80   Ht 5' 10 (1.778 m)   Wt 175 lb (79.4 kg)   BMI 25.11 kg/m   Constitutional:  Alert and oriented, no acute distress, nontoxic appearing HEENT: [place], AT Cardiovascular: No clubbing, cyanosis, or edema Respiratory: Normal respiratory effort, no increased work of breathing Skin: No rashes, bruises or suspicious lesions Neurologic: Grossly intact, no focal deficits, moving all 4 extremities Psychiatric: Normal mood and  affect  Laboratory Data: Results for orders placed or performed in visit on 06/01/23  PSA   Collection Time: 06/01/23  8:10 AM  Result Value Ref Range   Prostate Specific Ag, Serum <0.1 0.0 - 4.0 ng/mL   Assessment & Plan:   1. Prostate cancer (HCC) (Primary) I offered him sertraline for hot flashes, but he declined.  Will recheck PSA today.  Eligard  administered, see separate note.  We discussed that if his PSA has risen, we will plan for repeat PSMA PET, otherwise can defer additional imaging for now. - PSA - leuprolide  (6 Month) (ELIGARD ) injection 45 mg  Return in about 6 months (around 04/29/2024) for Prostate cancer follow up with Dr. Georganne with Eligard  and PSA prior.  Lucie Hones, PA-C  [facility]  [address] [place], [place] [address] [phone]

## 2023-10-28 NOTE — Progress Notes (Deleted)
.  buatest

## 2023-10-29 ENCOUNTER — Ambulatory Visit: Payer: Self-pay | Admitting: Physician Assistant

## 2023-10-29 LAB — PSA: Prostate Specific Ag, Serum: <0.1 ng/mL (ref 0.0–4.0)

## 2023-11-12 ENCOUNTER — Telehealth: Payer: Self-pay | Admitting: Family Medicine

## 2023-11-12 ENCOUNTER — Other Ambulatory Visit: Payer: Self-pay

## 2023-11-12 MED ORDER — OMEPRAZOLE 40 MG PO CPDR: 40.0000 mg | DELAYED_RELEASE_CAPSULE | Freq: Every day | ORAL | 3 refills | Status: DC

## 2023-11-12 MED ORDER — EZETIMIBE 10 MG PO TABS: 10.0000 mg | ORAL_TABLET | Freq: Every day | ORAL | 3 refills | Status: AC

## 2023-11-12 MED ORDER — OMEPRAZOLE 40 MG PO CPDR: 40.0000 mg | DELAYED_RELEASE_CAPSULE | Freq: Every day | ORAL | 3 refills | Status: AC

## 2023-11-12 NOTE — Telephone Encounter (Signed)
 Converted and sent to pharmacy

## 2023-11-12 NOTE — Telephone Encounter (Signed)
[facility] faxed refill request for the following medications: ? ?omeprazole (PRILOSEC) 40 MG capsule  ? ?Please advise. ? ? ? ?

## 2023-11-12 NOTE — Telephone Encounter (Signed)
[facility] faxed refill request for the following medications: ? ?ezetimibe (ZETIA) 10 MG tablet  ? ?Please advise. ? ?

## 2023-11-16 ENCOUNTER — Ambulatory Visit: Admitting: Dermatology

## 2023-11-30 ENCOUNTER — Telehealth: Payer: Self-pay | Admitting: Family Medicine

## 2023-11-30 ENCOUNTER — Other Ambulatory Visit: Payer: Self-pay

## 2023-11-30 DIAGNOSIS — G629 Polyneuropathy, unspecified: Secondary | ICD-10-CM

## 2023-11-30 NOTE — Telephone Encounter (Signed)
 Converted into refill request and route it to Provider.

## 2023-11-30 NOTE — Telephone Encounter (Signed)
 Patient's rx for Pregablin 50 mg. has expired and [facility] [address] [facility] a Academic librarian.  Patient is totally out of this med and needs it please.

## 2023-12-01 MED ORDER — PREGABALIN 50 MG PO CAPS: 50.0000 mg | ORAL_CAPSULE | Freq: Three times a day (TID) | ORAL | 0 refills | Status: DC

## 2023-12-02 ENCOUNTER — Ambulatory Visit (INDEPENDENT_AMBULATORY_CARE_PROVIDER_SITE_OTHER): Payer: Self-pay | Admitting: Family Medicine

## 2023-12-02 ENCOUNTER — Encounter: Payer: Self-pay | Admitting: Family Medicine

## 2023-12-02 VITALS — BP 134/82 | HR 55 | Ht 70.0 in | Wt 179.0 lb

## 2023-12-02 DIAGNOSIS — R0609 Other forms of dyspnea: Secondary | ICD-10-CM

## 2023-12-02 NOTE — Progress Notes (Signed)
 Acute visit   Patient: Dale Gray   DOB: 09/12/1955   68 y.o. Male  MRN: [id number] PCP: [name], DO   Chief Complaint  Patient presents with   Acute Visit    Patient is present due to SOB on exertion, no chest pain. He reports it has been present for a while but becoming worse. Reports episodes for the last 3-4 months. No hx of asthma, no smoking but around a lot of people who do. Reports it takes him a while to catch his breath after activity and sometimes he has to really gasp for air.   Declined influenza and pneumococcal vaccines   Subjective    Discussed the use of AI scribe software for clinical note transcription with the patient, who gave verbal consent to proceed.  History of Present Illness   Dale Gray is a 68 year old male who presents with exertional shortness of breath for the past four months.  Exertional shortness of breath has been present for three to four months, described as 'gasping for air'. It occurs during activities such as climbing ladders or walking up steps, which were previously manageable. A severe episode last week involved running across a yard, leading to breathlessness and falling to his knees. Symptoms resolve within a minute of rest, and he remains calm to avoid panic.  There is no associated chest pain, wheezing, or tightness during these episodes. He denies cough, runny nose, or fever. He has a history of nasal issues that have resolved and vocal cord issues that have improved, with no sensation of voice box tightening during episodes.  He is regularly exposed to secondhand smoke.        Review of Systems  Objective    BP 134/82 (BP Location: Left Arm, Patient Position: Sitting, Cuff Size: Normal)   Pulse (!) 55   Ht 5' 10 (1.778 m)   Wt 179 lb (81.2 kg)   SpO2 99%   BMI 25.68 kg/m  Physical Exam Vitals reviewed.  Constitutional:      General: He is not in acute distress.    Appearance: Normal  appearance. He is not diaphoretic.  HENT:     Head: Normocephalic and atraumatic.  Eyes:     General: No scleral icterus.    Conjunctiva/sclera: Conjunctivae normal.  Cardiovascular:     Rate and Rhythm: Normal rate and regular rhythm.     Heart sounds: Normal heart sounds. No murmur heard. Pulmonary:     Effort: Pulmonary effort is normal. No respiratory distress.     Breath sounds: Normal breath sounds. No wheezing or rhonchi.  Musculoskeletal:     Cervical back: Neck supple.     Right lower leg: No edema.     Left lower leg: No edema.  Lymphadenopathy:     Cervical: No cervical adenopathy.  Skin:    General: Skin is warm and dry.  Neurological:     Mental Status: He is alert and oriented to person, place, and time. Mental status is at baseline.  Psychiatric:        Mood and Affect: Mood normal.        Behavior: Behavior normal.       No results found for any visits on 12/02/23.  Assessment & Plan     Problem List Items Addressed This Visit   None Visit Diagnoses       DOE (dyspnea on exertion)    -  Primary  Relevant Orders   Ambulatory referral to Cardiology          Exertional dyspnea, possible cardiac etiology Exertional dyspnea for 3-4 months during physical activities such as climbing and walking. No associated chest pain, wheezing, or other respiratory symptoms. Lungs are clear on examination, suggesting a non-pulmonary cause. Differential diagnosis includes cardiac etiology, possibly angina presenting as dyspnea without chest pain. High risk for heart disease due to age and potential hyperlipidemia. - Refer to cardiology for cardiac workup to evaluate for potential cardiac etiology of exertional dyspnea. - Advise to seek emergency care if experiencing intense chest pain with shortness of breath. - Instruct to modulate activities to avoid exacerbating dyspnea, such as reducing exertion or taking breaks.       No orders of the defined types were placed in  this encounter.    Return if symptoms worsen or fail to improve.      Jon Eva, MD  [facility] [phone] (phone) [phone] (fax)  [facility]

## 2023-12-20 ENCOUNTER — Institutional Professional Consult (permissible substitution)

## 2023-12-31 NOTE — Progress Notes (Signed)
  Cardiology Office Note   Date:  01/10/2024  ID:  Dale Gray, Dale Gray [birth year], MRN [id number] PCP: [name], DO  [name] Cardiologist:  Caron Poser, MD     History of Present Illness Dale Gray is a 68 y.o. male PMH prostate cancer, HLD who presents for further evaluation management of DOE.  Patient reports a 3 to 67-month history of DOE.  He says he is typically a very active person and likes to move fast, but he has had to really slow down here over the last few months.  He said he notices dyspnea that occurs while walking up the steps and he sometimes needs to stop to catch his breath.  He has had a single isolated self-limited episode of chest discomfort a couple of weeks ago.  This did not occur with exertion.  Resolved spontaneously.  Last LDL 161 05/2023.  Relevant CVD History -None   ROS: Pt denies any chest discomfort, jaw pain, arm pain, palpitations, syncope, presyncope, orthopnea, PND, or LE edema.  Studies Reviewed I have independently reviewed the patient's ECG, previous medical records, recent blood work.  Physical Exam VS:  BP 118/60 (BP Location: Left Arm, Patient Position: Sitting, Cuff Size: Normal)   Pulse (!) 56 Comment: 55 oximeter  Ht 5' 10 (1.778 m)   Wt 181 lb 12.8 oz (82.5 kg)   SpO2 97%   BMI 26.09 kg/m        Wt Readings from Last 3 Encounters:  01/10/24 181 lb 12.8 oz (82.5 kg)  12/02/23 179 lb (81.2 kg)  10/28/23 175 lb (79.4 kg)    GEN: No acute distress. NECK: No JVD; No carotid bruits. CARDIAC: RRR, no murmurs, rubs, gallops. RESPIRATORY:  Clear to auscultation. EXTREMITIES:  Warm and well-perfused. No edema.  ASSESSMENT AND PLAN DOE Patient presents with undifferentiated dyspnea.  Symptoms do follow a typical exertional pattern that resolved with rest.  He has a normal physical exam and ECG.  Will need to rule out anginal equivalent and structural heart disease.  Does not have any symptoms  compatible with arrhythmia.  Plan: - Coronary CT angiogram - Echocardiogram to evaluate for structural causes - Continue ASA 81 mg daily while workup is underway - I advised him to seek emergency care should his symptoms become more frequent/unstable  HLD Statin intolerance ASCVD risk score 13.6%.  He has a history of significant statin intolerance with severe myalgias.  LDL 161 05/2023.  Will plan to start Repatha if not cost prohibitive.         Dispo: RTC as needed based on results of cardiac testing  Signed, Caron Poser, MD

## 2024-01-10 ENCOUNTER — Ambulatory Visit

## 2024-01-10 ENCOUNTER — Telehealth: Payer: Self-pay

## 2024-01-10 ENCOUNTER — Other Ambulatory Visit: Payer: Self-pay

## 2024-01-10 VITALS — BP 118/60 | HR 56 | Ht 70.0 in | Wt 181.8 lb

## 2024-01-10 DIAGNOSIS — Z79899 Other long term (current) drug therapy: Secondary | ICD-10-CM | POA: Diagnosis not present

## 2024-01-10 DIAGNOSIS — R0609 Other forms of dyspnea: Secondary | ICD-10-CM

## 2024-01-10 DIAGNOSIS — E782 Mixed hyperlipidemia: Secondary | ICD-10-CM

## 2024-01-10 DIAGNOSIS — Z789 Other specified health status: Secondary | ICD-10-CM | POA: Diagnosis not present

## 2024-01-10 MED ORDER — REPATHA SURECLICK 140 MG/ML ~~LOC~~ SOAJ
140.0000 mg | SUBCUTANEOUS | 6 refills | Status: AC
Start: 2024-01-10 — End: ?

## 2024-01-10 NOTE — Telephone Encounter (Signed)
 Pharmacy Patient Advocate Encounter   Received notification from Physician's Office that prior authorization for REPATHA is required/requested.   Insurance verification completed.   The patient is insured through [facility].   Per test claim: PA required; PA submitted to above mentioned insurance via Latent Key/confirmation #/[id number] Status is pending

## 2024-01-10 NOTE — Patient Instructions (Signed)
 Medication Instructions:  Your physician recommends that you continue on your current medications as directed. Please refer to the Current Medication list given to you today.  *If you need a refill on your cardiac medications before your next appointment, please call your pharmacy*  Lab Work: Your provider would like for you to have following labs drawn today BMET.   If you have labs (blood work) drawn today and your tests are completely normal, you will receive your results only by: MyChart Message (if you have MyChart) OR A paper copy in the mail If you have any lab test that is abnormal or we need to change your treatment, we will call you to review the results.  Testing/Procedures:  Your physician has requested that you have an echocardiogram. Echocardiography is a painless test that uses sound waves to create images of your heart. It provides your doctor with information about the size and shape of your heart and how well your heart's chambers and valves are working.   You may receive an ultrasound enhancing agent through an IV if needed to better visualize your heart during the echo. This procedure takes approximately one hour.  There are no restrictions for this procedure.  This will take place at [address] [facility] [address] [facility], [place] [address]  Please note: We ask at that you not bring children with you during ultrasound (echo/ vascular) testing. Due to room size and safety concerns, children are not allowed in the ultrasound rooms during exams. Our front office staff cannot provide observation of children in our lobby area while testing is being conducted. An adult accompanying a patient to their appointment will only be allowed in the ultrasound room at the discretion of the ultrasound technician under special circumstances. We apologize for any inconvenience.      Your cardiac CT will be scheduled at one of the below locations:   [facility] [address] [place], [place] [address] [phone]  If scheduled at [facility] or [facility], please arrive 15 mins early for check-in and test prep.  There is spacious parking and easy access to the radiology department from the [facility] entrance. Please enter here and check-in with the desk attendant.   Please follow these instructions carefully (unless otherwise directed):  An IV will be required for this test and Nitroglycerin will be given.  Hold all erectile dysfunction medications at least 3 days (72 hrs) prior to test. (Ie viagra , cialis , sildenafil , tadalafil , etc)   On the Night Before the Test: Be sure to Drink plenty of water . Do not consume any caffeinated/decaffeinated beverages (coffee, soft drinks/soda, energy drinks) or chocolate 12 hours prior to your test. Do not take any antihistamines 12 hours prior to your test.   On the Day of the Test: Drink plenty of water  until 1 hour prior to the test. Do not eat any food 1 hour prior to test. You may take your regular medications prior to the test.  HR 58 - no Beta Blocker ordered       After the Test: Drink plenty of water . After receiving IV contrast, you may experience a mild flushed feeling. This is normal. On occasion, you may experience a mild rash up to 24 hours after the test. This is not dangerous. If this occurs, you can take Benadryl  25 mg, Zyrtec , Claritin , or Allegra and increase your fluid intake. (Patients taking Tikosyn should avoid Benadryl , and may  take Zyrtec , Claritin , or Allegra) If you experience trouble breathing, this can be serious. If it is severe call 911 IMMEDIATELY. If it is mild, please call our office.  We will call to schedule your test 2-4 weeks out understanding that some insurance companies will need an authorization prior to the service being performed.   For more information and frequently asked questions,  please visit our website : [email or web address]  For non-scheduling related questions, please contact the cardiac imaging nurse navigator should you have any questions/concerns: Cardiac Imaging Nurse Navigators Direct Office Dial: [phone]   For scheduling needs, including cancellations and rescheduling, please call Brittany, [phone].    Follow-Up: At [facility], you and your health needs are our priority.  As part of our continuing mission to provide you with exceptional heart care, our providers are all part of one team.  This team includes your primary Cardiologist (physician) and Advanced Practice Providers or APPs (Physician Assistants and Nurse Practitioners) who all work together to provide you with the care you need, when you need it.  Your next appointment:   As need (based on test results)   Provider:Travis Skipina, MD    We recommend signing up for the patient portal called MyChart.  Sign up information is provided on this After Visit Summary.  MyChart is used to connect with patients for Virtual Visits (Telemedicine).  Patients are able to view lab/test results, encounter notes, upcoming appointments, etc.  Non-urgent messages can be sent to your provider as well.   To learn more about what you can do with MyChart, go to [email or web address].   Other Instructions

## 2024-01-11 ENCOUNTER — Other Ambulatory Visit (HOSPITAL_COMMUNITY): Payer: Self-pay

## 2024-01-11 ENCOUNTER — Ambulatory Visit: Payer: Self-pay

## 2024-01-11 LAB — BASIC METABOLIC PANEL WITH GFR
BUN/Creatinine Ratio: 16 (ref 10–24)
BUN: 17 mg/dL (ref 8–27)
CO2: 23 mmol/L (ref 20–29)
Calcium: 9.6 mg/dL (ref 8.6–10.2)
Chloride: 102 mmol/L (ref 96–106)
Creatinine, Ser: 1.08 mg/dL (ref 0.76–1.27)
Glucose: 88 mg/dL (ref 70–99)
Potassium: 4.3 mmol/L (ref 3.5–5.2)
Sodium: 139 mmol/L (ref 134–144)
eGFR: 76 mL/min/1.73 (ref >59)

## 2024-01-26 ENCOUNTER — Encounter (HOSPITAL_COMMUNITY): Payer: Self-pay

## 2024-01-28 ENCOUNTER — Telehealth (HOSPITAL_COMMUNITY): Payer: Self-pay | Admitting: *Deleted

## 2024-01-28 NOTE — Telephone Encounter (Signed)
 Reaching out to patient to offer assistance regarding upcoming cardiac imaging study; pt verbalizes understanding of appt date/time, parking situation and where to check in, pre-test NPO status and medications ordered, and verified current allergies; name and call back number provided for further questions should they arise Dale Seats RN Navigator Cardiac Imaging [facility] [phone] office [phone] cell

## 2024-01-31 ENCOUNTER — Other Ambulatory Visit: Payer: Self-pay

## 2024-01-31 ENCOUNTER — Ambulatory Visit (HOSPITAL_BASED_OUTPATIENT_CLINIC_OR_DEPARTMENT_OTHER)
Admission: RE | Admit: 2024-01-31 | Discharge: 2024-01-31 | Disposition: A | Discharge status: Home / Self Care | Source: Ambulatory Visit

## 2024-01-31 ENCOUNTER — Ambulatory Visit
Admission: RE | Admit: 2024-01-31 | Discharge: 2024-01-31 | Disposition: A | Discharge status: Home / Self Care | Source: Ambulatory Visit

## 2024-01-31 DIAGNOSIS — R0609 Other forms of dyspnea: Secondary | ICD-10-CM | POA: Insufficient documentation

## 2024-01-31 DIAGNOSIS — I2510 Atherosclerotic heart disease of native coronary artery without angina pectoris: Secondary | ICD-10-CM

## 2024-01-31 DIAGNOSIS — R931 Abnormal findings on diagnostic imaging of heart and coronary circulation: Secondary | ICD-10-CM

## 2024-01-31 MED ORDER — NITROGLYCERIN 0.4 MG SL SUBL
0.8000 mg | SUBLINGUAL_TABLET | Freq: Once | SUBLINGUAL | Status: AC
  Administered 2024-01-31: 0.8 mg via SUBLINGUAL
  Filled 2024-01-31: qty 25

## 2024-01-31 MED ORDER — IOHEXOL 350 MG/ML SOLN
100.0000 mL | Freq: Once | INTRAVENOUS | Status: AC | PRN
  Administered 2024-01-31: 100 mL via INTRAVENOUS

## 2024-01-31 NOTE — Progress Notes (Signed)
 Patient tolerated CT well. Vital signs stable encourage to drink water throughout day.Reasons explained and verbalized understanding. Ambulated steady gait.

## 2024-02-15 ENCOUNTER — Ambulatory Visit: Admitting: Dermatology

## 2024-02-28 ENCOUNTER — Ambulatory Visit

## 2024-02-28 DIAGNOSIS — R0609 Other forms of dyspnea: Secondary | ICD-10-CM

## 2024-02-28 LAB — ECHOCARDIOGRAM COMPLETE
AR max vel: 2.24 cm2
AV Area VTI: 1.94 cm2
AV Area mean vel: 2 cm2
AV Mean grad: 5 mmHg
AV Peak grad: 8.9 mmHg
Ao pk vel: 1.49 m/s
Area-P 1/2: 3.65 cm2
S' Lateral: 3.1 cm

## 2024-03-03 ENCOUNTER — Ambulatory Visit

## 2024-04-03 ENCOUNTER — Other Ambulatory Visit: Payer: Self-pay | Admitting: Family Medicine

## 2024-04-03 DIAGNOSIS — G629 Polyneuropathy, unspecified: Secondary | ICD-10-CM

## 2024-04-04 NOTE — Telephone Encounter (Signed)
 Requested medication (s) are due for refill today: Yes  Requested medication (s) are on the active medication list: Yes  Last refill:  12/01/23  Future visit scheduled: Yes  Notes to clinic:  Unable to refill per protocol, cannot delegate.      Requested Prescriptions  Pending Prescriptions Disp Refills   pregabalin  (LYRICA ) 50 MG capsule [Pharmacy Med Name: PREGABALIN  50 MG CAPSULE] 270 capsule 0    Sig: TAKE 1 CAPSULE BY MOUTH 3 TIMES DAILY.     Not Delegated - Neurology:  Anticonvulsants - Controlled - pregabalin  Failed - 04/04/2024  2:06 PM      Failed - This refill cannot be delegated      Passed - Cr in normal range and within 360 days    Creat  Date Value Ref Range Status  02/18/2017 1.02 0.70 - 1.33 mg/dL Final    Comment:    For patients >67 years of age, the reference limit for Creatinine is approximately 13% higher for people identified as African-American. .    Creatinine, Ser  Date Value Ref Range Status  01/10/2024 1.08 0.76 - 1.27 mg/dL Final         Passed - Completed PHQ-2 or PHQ-9 in the last 360 days      Passed - Valid encounter within last 12 months    Recent Outpatient Visits           4 months ago DOE (dyspnea on exertion)   [facility] Bac[place]alupo, Jon HERO, MD   10 months ago Annual physical exam   [facility] Pardue, Lauraine SAILOR, DO       Future Appointments             In 3 weeks Georganne, Penne SAUNDERS, MD [facility] [place]   In 1 month Pardue, Lauraine SAILOR, DO [facility], [place]

## 2024-04-21 ENCOUNTER — Other Ambulatory Visit

## 2024-04-26 ENCOUNTER — Ambulatory Visit: Admitting: Urology

## 2024-04-28 ENCOUNTER — Ambulatory Visit: Admitting: Urology

## 2024-05-26 ENCOUNTER — Encounter: Admitting: Family Medicine
